# Patient Record
Sex: Male | Born: 1950 | Race: White | Hispanic: No | Marital: Single | Smoking: Never smoker
Health system: Southern US, Community
[De-identification: ages and names within clinical notes are randomized; demographics above are authoritative.]

## PROBLEM LIST (undated history)

## (undated) DIAGNOSIS — I499 Cardiac arrhythmia, unspecified: Secondary | ICD-10-CM

## (undated) DIAGNOSIS — E119 Type 2 diabetes mellitus without complications: Secondary | ICD-10-CM

## (undated) DIAGNOSIS — D649 Anemia, unspecified: Secondary | ICD-10-CM

## (undated) DIAGNOSIS — I4891 Unspecified atrial fibrillation: Secondary | ICD-10-CM

## (undated) DIAGNOSIS — K219 Gastro-esophageal reflux disease without esophagitis: Secondary | ICD-10-CM

## (undated) DIAGNOSIS — G2581 Restless legs syndrome: Secondary | ICD-10-CM

## (undated) DIAGNOSIS — R413 Other amnesia: Secondary | ICD-10-CM

## (undated) DIAGNOSIS — R600 Localized edema: Secondary | ICD-10-CM

## (undated) DIAGNOSIS — C4490 Unspecified malignant neoplasm of skin, unspecified: Secondary | ICD-10-CM

---

## 2007-07-16 ENCOUNTER — Ambulatory Visit: Payer: Self-pay | Admitting: Family Medicine

## 2013-05-27 ENCOUNTER — Ambulatory Visit: Payer: Self-pay | Admitting: Internal Medicine

## 2013-08-27 ENCOUNTER — Ambulatory Visit: Payer: Self-pay | Admitting: Physician Assistant

## 2015-02-05 ENCOUNTER — Encounter: Payer: Self-pay | Admitting: Emergency Medicine

## 2015-02-05 ENCOUNTER — Ambulatory Visit
Admission: EM | Admit: 2015-02-05 | Discharge: 2015-02-05 | Disposition: A | Discharge status: Home / Self Care | Payer: BLUE CROSS/BLUE SHIELD | Attending: Family Medicine | Admitting: Family Medicine

## 2015-02-05 DIAGNOSIS — L03031 Cellulitis of right toe: Secondary | ICD-10-CM

## 2015-02-05 DIAGNOSIS — B353 Tinea pedis: Secondary | ICD-10-CM | POA: Diagnosis not present

## 2015-02-05 MED ORDER — LEVOFLOXACIN 750 MG PO TABS: 750.0000 mg | ORAL_TABLET | Freq: Every day | ORAL | Status: DC

## 2015-02-05 MED ORDER — KETOCONAZOLE 2 % EX CREA: 1.0000 "application " | TOPICAL_CREAM | Freq: Two times a day (BID) | CUTANEOUS | Status: DC

## 2015-02-05 MED ORDER — MUPIROCIN 2 % EX OINT: 1.0000 "application " | TOPICAL_OINTMENT | Freq: Three times a day (TID) | CUTANEOUS | Status: DC

## 2015-02-05 NOTE — ED Notes (Signed)
Patient c/o redness, pain, and swelling in his right 3rd toe since Tuesday.

## 2015-02-05 NOTE — Discharge Instructions (Signed)
Athlete's Foot  Athlete's foot is a skin infection caused by a fungus. Athlete's foot is often seen between or under the toes. It can also be seen on the bottom of the foot. Athlete's foot can spread to other people by sharing towels or shower stalls. HOME CARE  Only take medicines as told by your doctor. Do not use steroid creams.  Wash your feet daily. Dry your feet well, especially between the toes.  Change your socks every day. Wear cotton or wool socks.  Change your socks 2 to 3 times a day in hot weather.  Wear sandals or canvas tennis shoes with good airflow.  If you have blisters, soak your feet in a solution as told by your doctor. Do this for 20 to 30 minutes, 2 times a day. Dry your feet well after you soak them.  Do not share towels.  Wear sandals when you use shared locker rooms or showers. GET HELP RIGHT AWAY IF:   You have a fever.  Your foot is puffy (swollen), sore, warm, or red.  You are not getting better after 7 days of treatment.  You still have athlete's foot after 30 days.  You have problems caused by your medicine. MAKE SURE YOU:   Understand these instructions.  Will watch your condition.  Will get help right away if you are not doing well or get worse.   This information is not intended to replace advice given to you by your health care provider. Make sure you discuss any questions you have with your health care provider.   Document Released: 08/02/2007 Document Revised: 05/08/2011 Document Reviewed: 08/17/2014 Elsevier Interactive Patient Education 2016 [employer]  Cellulitis Cellulitis is an infection of the skin and the tissue under the skin. The infected area is usually red and tender. This happens most often in the arms and lower legs. HOME CARE   Take your antibiotic medicine as told. Finish the medicine even if you start to feel better.  Keep the infected arm or leg raised (elevated).  Put a warm cloth on the area up to 4 times  per day.  Only take medicines as told by your doctor.  Keep all doctor visits as told. GET HELP IF:  You see red streaks on the skin coming from the infected area.  Your red area gets bigger or turns a dark color.  Your bone or joint under the infected area is painful after the skin heals.  Your infection comes back in the same area or different area.  You have a puffy (swollen) bump in the infected area.  You have new symptoms.  You have a fever. GET HELP RIGHT AWAY IF:   You feel very sleepy.  You throw up (vomit) or have watery poop (diarrhea).  You feel sick and have muscle aches and pains.   This information is not intended to replace advice given to you by your health care provider. Make sure you discuss any questions you have with your health care provider.   Document Released: 08/02/2007 Document Revised: 11/04/2014 Document Reviewed: 05/01/2011 Elsevier Interactive Patient Education [employer].

## 2015-02-05 NOTE — ED Provider Notes (Signed)
CSN: [id number]     Arrival date & time 02/05/15  1531 History   First MD Initiated Contact with Patient 02/05/15 1655   Nurses notes were reviewed. Chief Complaint  Patient presents with  . Cellulitis     Patient reports swelling of his right third toe started yesterday. He states he's been wearing tennis shoes that he thinks may have been too tight.for both feet. He also reports a rash on both feet as well.     (Consider location/radiation/quality/duration/timing/severity/associated sxs/prior Treatment) Patient is a 64 y.o. male presenting with abscess. The history is provided by the patient. No language interpreter was used.  Abscess Location:  Foot Foot abscess location:  R foot Abscess quality: induration, itching, painful, redness and warmth   Abscess quality: no fluctuance   Red streaking: yes   Progression:  Worsening Pain details:    Quality:  Throbbing and shooting   Severity:  Moderate   Timing:  Constant   Progression:  Waxing and waning Chronicity:  New Relieved by:  Nothing Ineffective treatments:  Topical antibiotics Associated symptoms: no anorexia, no fatigue, no fever, no headaches, no nausea and no vomiting   Risk factors: no family hx of MRSA, no hx of MRSA and no prior abscess     History reviewed. No pertinent past medical history. History reviewed. No pertinent past surgical history. History reviewed. No pertinent family history. Social History  Substance Use Topics  . Smoking status: Never Smoker   . Smokeless tobacco: None  . Alcohol Use: Yes    Review of Systems  Constitutional: Negative for fever and fatigue.  Gastrointestinal: Negative for nausea, vomiting and anorexia.  Neurological: Negative for headaches.  All other systems reviewed and are negative.   Allergies  Review of patient's allergies indicates no known allergies.  Home Medications   Prior to Admission medications   Medication Sig Start Date End Date Taking? Authorizing  Provider  ketoconazole (NIZORAL) 2 % cream Apply 1 application topically 2 (two) times daily. 02/05/15   Frederich Cha, MD  levofloxacin (LEVAQUIN) 750 MG tablet Take 1 tablet (750 mg total) by mouth daily. 02/05/15   Frederich Cha, MD  mupirocin ointment (BACTROBAN) 2 % Apply 1 application topically 3 (three) times daily. 02/05/15   Frederich Cha, MD   Meds Ordered and Administered this Visit  Medications - No data to display  BP 125/82 mmHg  Pulse 69  Temp(Src) 97.4 F (36.3 C) (Tympanic)  Resp 16  Ht 5\' 7"  (1.702 m)  Wt 170 lb (77.111 kg)  BMI 26.62 kg/m2  SpO2 100% No data found.   Physical Exam  Constitutional: He is oriented to person, place, and time. He appears well-developed and well-nourished.  HENT:  Head: Normocephalic and atraumatic.  Eyes: Pupils are equal, round, and reactive to light.  Neck: Neck supple.  Musculoskeletal:       Right ankle: He exhibits no swelling and no ecchymosis.       Right foot: There is tenderness, swelling and deformity.       Feet:  Patient right third toe is markedly swollen red inflamed this is small ulceration but not deep present there is redness along both bottoms of feet consistent with fungal infection and a cellulitis on the right middle toe.  Neurological: He is alert and oriented to person, place, and time.  Skin: Skin is warm. Rash noted. There is erythema.  Psychiatric: He has a normal mood and affect. His behavior is normal.  Vitals reviewed.  ED Course  Procedures (including critical care time)  Labs Review Labs Reviewed - No data to display  Imaging Review No results found.   Visual Acuity Review  Right Eye Distance:   Left Eye Distance:   Bilateral Distance:    Right Eye Near:   Left Eye Near:    Bilateral Near:         MDM   1. Cellulitis of third toe, right   2. Tinea pedis of both feet     He has been using triple antibiotic ointment and Panafil cream over-the-counter. Stop both of those. Placed on  Bactrim ointment 3 times a day for the infected right middle toe. Nizoral cream for the both feet particularly fungal infection. And then Levaquin 751 tablet day for 10-12 days for the cellulitis of the right middle toe. Recommend podiatry referral next week 4 days. I recommend he stay off his feet this week and he says he has to go to work. Soak feet in Epsom salts twice a day. Elevate feet. And I recommend that he fell with 2 issues that apparently was too tight canal and cause the rubbing of his toes together and the presence of the fungal infection.      Frederich Cha, MD 02/05/15 [phone]

## 2017-02-27 DIAGNOSIS — I639 Cerebral infarction, unspecified: Secondary | ICD-10-CM

## 2017-02-27 HISTORY — DX: Cerebral infarction, unspecified: I63.9

## 2017-05-24 ENCOUNTER — Ambulatory Visit
Admission: EM | Admit: 2017-05-24 | Discharge: 2017-05-24 | Disposition: A | Discharge status: Home / Self Care | Payer: BLUE CROSS/BLUE SHIELD | Attending: Family Medicine | Admitting: Family Medicine

## 2017-05-24 ENCOUNTER — Other Ambulatory Visit: Payer: Self-pay

## 2017-05-24 DIAGNOSIS — M7041 Prepatellar bursitis, right knee: Secondary | ICD-10-CM

## 2017-05-24 HISTORY — DX: Unspecified malignant neoplasm of skin, unspecified: C44.90

## 2017-05-24 MED ORDER — MELOXICAM 15 MG PO TABS: 15.0000 mg | ORAL_TABLET | Freq: Every day | ORAL | 0 refills | Status: DC

## 2017-05-24 NOTE — ED Provider Notes (Signed)
[facility]    CSN: [id number] Arrival date & time: 05/24/17  0850   History   Chief Complaint Chief Complaint  Patient presents with  . Joint Swelling   HPI  67 year old male presents with right knee swelling.  Patient reports a 1-2-week history of swelling of the right knee.  He states that he works on his knees quite a bit.  This is likely the culprit for his swelling.  He reports some associated discomfort but denies pain.  He states that the swelling has persisted.  No recent fall or direct injury that he knows of.  As stated previously, he does work on his knees frequently.  He is no longer using pads for protection.  No reports of redness.  No fever.  No other associated symptoms.  No known relieving factors.  No other complaints at this time.  Social History Social History   Tobacco Use  . Smoking status: Never Smoker  . Smokeless tobacco: Never Used  Substance Use Topics  . Alcohol use: Yes    Comment: rare  . Drug use: Not Currently     Allergies   Patient has no known allergies.   Review of Systems Review of Systems  Constitutional: Negative.   Musculoskeletal:       Right knee swelling.   Physical Exam Triage Vital Signs ED Triage Vitals  Enc Vitals Group     BP 05/24/17 0921 111/81     Pulse Rate 05/24/17 0921 84     Resp 05/24/17 0921 18     Temp 05/24/17 0921 98.2 F (36.8 C)     Temp Source 05/24/17 0921 Oral     SpO2 05/24/17 0921 99 %     Weight 05/24/17 0923 185 lb (83.9 kg)     Height 05/24/17 0923 5\' 10"  (1.778 m)     Head Circumference --      Peak Flow --      Pain Score 05/24/17 0923 0     Pain Loc --      Pain Edu? --      Excl. in [place]? --    Updated Vital Signs BP 111/81 (BP Location: Left Arm)   Pulse 84   Temp 98.2 F (36.8 C) (Oral)   Resp 18   Ht 5\' 10"  (1.778 m)   Wt 185 lb (83.9 kg)   SpO2 99%   BMI 26.54 kg/m    Physical Exam  Constitutional: He is oriented to person, place, and time. He appears  well-developed. No distress.  Pulmonary/Chest: Effort normal. No respiratory distress.  Musculoskeletal:  Left knee -marked effusion noted overlying the patella.  Nontender.  Findings consistent with prepatellar bursitis.  Neurological: He is alert and oriented to person, place, and time.  Skin: Skin is warm. No rash noted. No erythema.  Psychiatric: He has a normal mood and affect. His behavior is normal.  Nursing note and vitals reviewed.  UC Treatments / Results  Labs (all labs ordered are listed, but only abnormal results are displayed) Labs Reviewed - No data to display  EKG None Radiology No results found.  Procedures Join Aspiration/Injection Date/Time: 05/24/2017 11:38 AM Performed by: Coral Spikes, DO Authorized by: Coral Spikes, DO   Consent:    Consent obtained:  Verbal   Consent given by:  Patient Location:    Location:  Knee   Knee:  R knee Anesthesia (see MAR for exact dosages):    Anesthesia method:  Local infiltration  Local anesthetic:  Lidocaine 1% w/o epi Procedure details:    Preparation: Patient was prepped and draped in usual sterile fashion     Needle gauge:  18 G   Ultrasound guidance: no     Approach:  Lateral   Aspirate amount:  50 mL   Aspirate characteristics:  Blood-tinged   Steroid injected: no   Post-procedure details:    Patient tolerance of procedure:  Tolerated well, no immediate complications   (including critical care time)   Medications Ordered in UC Medications - No data to display   Initial Impression / Assessment and Plan / UC Course  I have reviewed the triage vital signs and the nursing notes.  Pertinent labs & imaging results that were available during my care of the patient were reviewed by me and considered in my medical decision making (see chart for details).     67 year old male presents with prepatellar bursitis.  Aspiration done today with removal of 50 mL's of blood-tinged fluid.  Placed on meloxicam.   Advised a knee sleeve and to try and avoid working on his knees.  Final Clinical Impressions(s) / UC Diagnoses   Final diagnoses:  Prepatellar bursitis of right knee    ED Discharge Orders        Ordered    meloxicam (MOBIC) 15 MG tablet  Daily     05/24/17 1110     Controlled Substance Prescriptions [place] Controlled Substance Registry consulted? Not Applicable   Coral Spikes, DO 05/24/17 1140

## 2017-05-24 NOTE — Discharge Instructions (Signed)
Rest.  Knee sleeve daily.  Medication as prescribed.  Take care  Dr. Lacinda Axon

## 2017-05-24 NOTE — ED Triage Notes (Addendum)
Pt with right patellar swelling/fluid x one week. Denies pain.

## 2019-01-21 ENCOUNTER — Other Ambulatory Visit: Payer: Self-pay

## 2019-01-21 ENCOUNTER — Emergency Department: Payer: Medicare Other

## 2019-01-21 ENCOUNTER — Encounter: Payer: Self-pay | Admitting: Emergency Medicine

## 2019-01-21 ENCOUNTER — Observation Stay
Admission: EM | Admit: 2019-01-21 | Discharge: 2019-01-22 | Disposition: A | Discharge status: Home / Self Care | Payer: Medicare Other | Attending: Internal Medicine | Admitting: Internal Medicine

## 2019-01-21 DIAGNOSIS — I82409 Acute embolism and thrombosis of unspecified deep veins of unspecified lower extremity: Secondary | ICD-10-CM | POA: Diagnosis not present

## 2019-01-21 DIAGNOSIS — R7989 Other specified abnormal findings of blood chemistry: Secondary | ICD-10-CM | POA: Insufficient documentation

## 2019-01-21 DIAGNOSIS — Z7901 Long term (current) use of anticoagulants: Secondary | ICD-10-CM | POA: Insufficient documentation

## 2019-01-21 DIAGNOSIS — I472 Ventricular tachycardia: Secondary | ICD-10-CM | POA: Diagnosis not present

## 2019-01-21 DIAGNOSIS — R55 Syncope and collapse: Secondary | ICD-10-CM | POA: Diagnosis present

## 2019-01-21 DIAGNOSIS — I480 Paroxysmal atrial fibrillation: Secondary | ICD-10-CM | POA: Insufficient documentation

## 2019-01-21 DIAGNOSIS — E876 Hypokalemia: Secondary | ICD-10-CM | POA: Diagnosis not present

## 2019-01-21 DIAGNOSIS — D649 Anemia, unspecified: Secondary | ICD-10-CM | POA: Diagnosis not present

## 2019-01-21 DIAGNOSIS — R42 Dizziness and giddiness: Secondary | ICD-10-CM

## 2019-01-21 DIAGNOSIS — R0789 Other chest pain: Secondary | ICD-10-CM | POA: Diagnosis not present

## 2019-01-21 DIAGNOSIS — Z8673 Personal history of transient ischemic attack (TIA), and cerebral infarction without residual deficits: Secondary | ICD-10-CM | POA: Diagnosis not present

## 2019-01-21 DIAGNOSIS — Z85828 Personal history of other malignant neoplasm of skin: Secondary | ICD-10-CM | POA: Diagnosis not present

## 2019-01-21 DIAGNOSIS — I959 Hypotension, unspecified: Secondary | ICD-10-CM | POA: Diagnosis present

## 2019-01-21 DIAGNOSIS — Z791 Long term (current) use of non-steroidal anti-inflammatories (NSAID): Secondary | ICD-10-CM | POA: Insufficient documentation

## 2019-01-21 DIAGNOSIS — R000 Tachycardia, unspecified: Secondary | ICD-10-CM

## 2019-01-21 DIAGNOSIS — Z20828 Contact with and (suspected) exposure to other viral communicable diseases: Secondary | ICD-10-CM | POA: Insufficient documentation

## 2019-01-21 DIAGNOSIS — I2510 Atherosclerotic heart disease of native coronary artery without angina pectoris: Secondary | ICD-10-CM | POA: Insufficient documentation

## 2019-01-21 LAB — CBC WITH DIFFERENTIAL/PLATELET
Abs Immature Granulocytes: 0.04 10*3/uL (ref 0.00–0.07)
Basophils Absolute: 0.1 10*3/uL (ref 0.0–0.1)
Basophils Relative: 1 %
Eosinophils Absolute: 0.1 10*3/uL (ref 0.0–0.5)
Eosinophils Relative: 1 %
HCT: 37.1 % — ABNORMAL LOW (ref 39.0–52.0)
Hemoglobin: 12.6 g/dL — ABNORMAL LOW (ref 13.0–17.0)
Immature Granulocytes: 0 %
Lymphocytes Relative: 32 %
Lymphs Abs: 3.3 10*3/uL (ref 0.7–4.0)
MCH: 30.7 pg (ref 26.0–34.0)
MCHC: 34 g/dL (ref 30.0–36.0)
MCV: 90.3 fL (ref 80.0–100.0)
Monocytes Absolute: 0.6 10*3/uL (ref 0.1–1.0)
Monocytes Relative: 6 %
Neutro Abs: 6.1 10*3/uL (ref 1.7–7.7)
Neutrophils Relative %: 60 %
Platelets: 276 10*3/uL (ref 150–400)
RBC: 4.11 MIL/uL — ABNORMAL LOW (ref 4.22–5.81)
RDW: 13.4 % (ref 11.5–15.5)
WBC: 10.3 10*3/uL (ref 4.0–10.5)
nRBC: 0 % (ref 0.0–0.2)

## 2019-01-21 LAB — PROTIME-INR
INR: 1.1 (ref 0.8–1.2)
Prothrombin Time: 13.7 seconds (ref 11.4–15.2)

## 2019-01-21 LAB — COMPREHENSIVE METABOLIC PANEL
ALT: 26 U/L (ref 0–44)
AST: 27 U/L (ref 15–41)
Albumin: 3.9 g/dL (ref 3.5–5.0)
Alkaline Phosphatase: 49 U/L (ref 38–126)
Anion gap: 14 (ref 5–15)
BUN: 23 mg/dL (ref 8–23)
CO2: 22 mmol/L (ref 22–32)
Calcium: 8.8 mg/dL — ABNORMAL LOW (ref 8.9–10.3)
Chloride: 101 mmol/L (ref 98–111)
Creatinine, Ser: 0.78 mg/dL (ref 0.61–1.24)
GFR calc Af Amer: >60 mL/min (ref >60)
GFR calc non Af Amer: >60 mL/min (ref >60)
Glucose, Bld: 194 mg/dL — ABNORMAL HIGH (ref 70–99)
Potassium: 3.4 mmol/L — ABNORMAL LOW (ref 3.5–5.1)
Sodium: 137 mmol/L (ref 135–145)
Total Bilirubin: 0.7 mg/dL (ref 0.3–1.2)
Total Protein: 6.7 g/dL (ref 6.5–8.1)

## 2019-01-21 LAB — TROPONIN I (HIGH SENSITIVITY)
Troponin I (High Sensitivity): 21 ng/L — ABNORMAL HIGH (ref <18)
Troponin I (High Sensitivity): 389 ng/L (ref <18)

## 2019-01-21 LAB — APTT: aPTT: 28 seconds (ref 24–36)

## 2019-01-21 LAB — MAGNESIUM: Magnesium: 2 mg/dL (ref 1.7–2.4)

## 2019-01-21 LAB — T4, FREE: Free T4: 0.87 ng/dL (ref 0.61–1.12)

## 2019-01-21 LAB — TSH: TSH: 4.729 u[IU]/mL — ABNORMAL HIGH (ref 0.350–4.500)

## 2019-01-21 MED ORDER — AMIODARONE HCL IN DEXTROSE 360-4.14 MG/200ML-% IV SOLN
30.0000 mg/h | INTRAVENOUS | Status: DC
  Administered 2019-01-22: 02:00:00 30 mg/h via INTRAVENOUS
  Filled 2019-01-21: qty 200

## 2019-01-21 MED ORDER — POTASSIUM CHLORIDE CRYS ER 20 MEQ PO TBCR: 40.0000 meq | EXTENDED_RELEASE_TABLET | Freq: Once | ORAL | Status: DC

## 2019-01-21 MED ORDER — DILTIAZEM HCL 30 MG PO TABS
60.0000 mg | ORAL_TABLET | Freq: Four times a day (QID) | ORAL | Status: DC
  Administered 2019-01-21 – 2019-01-22 (×3): 60 mg via ORAL
  Filled 2019-01-21 (×3): qty 2

## 2019-01-21 MED ORDER — ETOMIDATE 2 MG/ML IV SOLN
INTRAVENOUS | Status: AC
  Filled 2019-01-21: qty 10

## 2019-01-21 MED ORDER — POTASSIUM CHLORIDE CRYS ER 20 MEQ PO TBCR
40.0000 meq | EXTENDED_RELEASE_TABLET | Freq: Once | ORAL | Status: AC
  Administered 2019-01-21: 40 meq via ORAL
  Filled 2019-01-21: qty 2

## 2019-01-21 MED ORDER — AMIODARONE HCL IN DEXTROSE 360-4.14 MG/200ML-% IV SOLN
60.0000 mg/h | INTRAVENOUS | Status: AC
  Administered 2019-01-21: 60 mg/h via INTRAVENOUS
  Filled 2019-01-21: qty 200

## 2019-01-21 MED ORDER — MAGNESIUM OXIDE 400 (241.3 MG) MG PO TABS
400.0000 mg | ORAL_TABLET | Freq: Every day | ORAL | Status: DC
  Administered 2019-01-22: 400 mg via ORAL
  Filled 2019-01-21: qty 1

## 2019-01-21 MED ORDER — POTASSIUM CHLORIDE CRYS ER 20 MEQ PO TBCR
20.0000 meq | EXTENDED_RELEASE_TABLET | Freq: Every day | ORAL | Status: DC
Start: 2019-01-21 — End: 2019-01-21
  Administered 2019-01-21: 20 meq via ORAL
  Filled 2019-01-21: qty 1

## 2019-01-21 MED ORDER — APIXABAN 5 MG PO TABS
5.0000 mg | ORAL_TABLET | Freq: Two times a day (BID) | ORAL | Status: DC
  Administered 2019-01-21 – 2019-01-22 (×2): 5 mg via ORAL
  Filled 2019-01-21 (×2): qty 1

## 2019-01-21 MED ORDER — ATORVASTATIN CALCIUM 80 MG PO TABS
80.0000 mg | ORAL_TABLET | Freq: Every day | ORAL | Status: DC
  Administered 2019-01-22: 80 mg via ORAL
  Filled 2019-01-21: qty 1

## 2019-01-21 MED ORDER — ETOMIDATE 2 MG/ML IV SOLN
8.0000 mg | Freq: Once | INTRAVENOUS | Status: AC
  Administered 2019-01-21: 8 mg via INTRAVENOUS

## 2019-01-21 MED ORDER — DILTIAZEM HCL ER COATED BEADS 120 MG PO CP24: 120.0000 mg | ORAL_CAPSULE | Freq: Every day | ORAL | Status: DC

## 2019-01-21 NOTE — Progress Notes (Signed)
Telemetry called and stated patient has a 83 beat run of V-tach. Dr paged, will continue to monitor.

## 2019-01-21 NOTE — ED Notes (Signed)
9 beat run of vtach noted on monitor; pt remains without c/o; daughter at bedside; both voice good understanding of plan of care

## 2019-01-21 NOTE — ED Notes (Signed)
5 beat run vtach

## 2019-01-21 NOTE — ED Notes (Signed)
Pt assisted to stand at bedside to void; denies c/o; pt assisted into hosp gown; remains on card monitor, SR with PVC's noted

## 2019-01-21 NOTE — ED Triage Notes (Addendum)
Pt arrived via [facility] from working at [employer]. States that he felt very faint and passed out while working and lifting an object over his headaround 1730. States that this has never occurred before. Pt denies SOB, fever.

## 2019-01-21 NOTE — ED Provider Notes (Signed)
[facility] ([facility]) Emergency Department Provider Note  ____________________________________________   First MD Initiated Contact with Patient 01/21/19 1851     (approximate)  I have reviewed the triage vital signs and the nursing notes.   HISTORY  Chief Complaint Loss of Consciousness    HPI Timothy Wells is a 68 y.o. male with paroxysmal A. fib on Eliquis who presents with dizziness.  Patient was at work when he lifted his arms up and had sudden onset of severe dizziness that was constant, nothing made it better, nothing made it worse.  On EMS arrival patient was found to be in a wide-complex tachycardia rate of 225.  Patient was initially little hypotensive but was given 150 of amnio.  His blood pressures initially improved that started getting hypotensive again.  Upon arrival patient is alert and oriented.  He does have some chest pressure sensation.  He denies ever need electricity before.  He denies missing any of his Eliquis dosing.          Past Medical History:  Diagnosis Date   Skin cancer     There are no active problems to display for this patient.   No past surgical history on file.  Prior to Admission medications   Medication Sig Start Date End Date Taking? Authorizing Provider  meloxicam (MOBIC) 15 MG tablet Take 1 tablet (15 mg total) by mouth daily. 05/24/17   Coral Spikes, DO    Allergies Patient has no known allergies.  No family history on file.  Social History Social History   Tobacco Use   Smoking status: Never Smoker   Smokeless tobacco: Never Used  Substance Use Topics   Alcohol use: Yes    Comment: rare   Drug use: Not Currently      Review of Systems Constitutional: No fever/chills Eyes: No visual changes. ENT: No sore throat. Cardiovascular: Positive chest pain Respiratory: Denies shortness of breath. Gastrointestinal: No abdominal pain.  No nausea, no vomiting.  No diarrhea.  No  constipation. Genitourinary: Negative for dysuria. Musculoskeletal: Negative for back pain. Skin: Negative for rash. Neurological: Negative for headaches, focal weakness or numbness.  Positive lightheadedness All other ROS negative ____________________________________________   PHYSICAL EXAM:  VITAL SIGNS: ED Triage Vitals  Enc Vitals Group     BP 01/21/19 1843 91/69     Pulse Rate 01/21/19 1843 (!) 119     Resp 01/21/19 1843 (!) 30     Temp --      Temp src --      SpO2 01/21/19 1843 95 %     Weight 01/21/19 1840 187 lb (84.8 kg)     Height --      Head Circumference --      Peak Flow --      Pain Score 01/21/19 1845 0     Pain Loc --      Pain Edu? --      Excl. in [place]? --     Constitutional: Alert and oriented. Well appearing and in no acute distress. Eyes: Conjunctivae are normal. EOMI. Head: Atraumatic. Nose: No congestion/rhinnorhea. Mouth/Throat: Mucous membranes are moist.   Neck: No stridor. Trachea Midline. FROM Cardiovascular: Fast, regular heart rate Respiratory: Normal respiratory effort.  No retractions. Lungs CTAB. Gastrointestinal: Soft and nontender. No distention. No abdominal bruits.  Musculoskeletal: No lower extremity tenderness nor edema.  No joint effusions. Neurologic:  Normal speech and language. No gross focal neurologic deficits are appreciated.  Skin:  Skin is  warm, dry and intact. No rash noted. Psychiatric: Mood and affect are normal. Speech and behavior are normal. GU: Deferred   ____________________________________________   LABS (all labs ordered are listed, but only abnormal results are displayed)  Labs Reviewed  CBC WITH DIFFERENTIAL/PLATELET - Abnormal; Notable for the following components:      Result Value   RBC 4.11 (*)    Hemoglobin 12.6 (*)    HCT 37.1 (*)    All other components within normal limits  COMPREHENSIVE METABOLIC PANEL - Abnormal; Notable for the following components:   Potassium 3.4 (*)    Glucose, Bld 194  (*)    Calcium 8.8 (*)    All other components within normal limits  TSH - Abnormal; Notable for the following components:   TSH 4.729 (*)    All other components within normal limits  TROPONIN I (HIGH SENSITIVITY) - Abnormal; Notable for the following components:   Troponin I (High Sensitivity) 21 (*)    All other components within normal limits  SARS CORONAVIRUS 2 (TAT 6-24 HRS)  APTT  PROTIME-INR  MAGNESIUM  T4, FREE  TROPONIN I (HIGH SENSITIVITY)   ____________________________________________   ED ECG REPORT I, Vanessa Chatham, the attending physician, personally viewed and interpreted this ECG.  Initial EKG is a wide-complex tachycardia rate of 212, no ST elevation, no T wave inversions, consider possible V. tach versus A. fib with aberrancy ____________________________________________  RADIOLOGY Robert Bellow, personally viewed and evaluated these images (plain radiographs) as part of my medical decision making, as well as reviewing the written report by the radiologist.  ED MD interpretation:  No PNA   Official radiology report(s): Dg Chest Portable 1 View  Result Date: 01/21/2019 CLINICAL DATA:  Chest pain EXAM: PORTABLE CHEST 1 VIEW COMPARISON:  None. FINDINGS: Mild cardiac enlargement. Negative for heart failure or edema. Lungs are well aerated without infiltrate or effusion. IMPRESSION: No active disease. Electronically Signed   By: Franchot Gallo M.D.   On: 01/21/2019 19:24    ____________________________________________   PROCEDURES  Procedure(s) performed (including Critical Care):  .Sedation  Date/Time: 01/21/2019 6:55 PM Performed by: Vanessa Carmen, MD Authorized by: Vanessa [place], MD   Consent:    Consent obtained:  Verbal   Consent given by:  Patient   Risks discussed:  Allergic reaction, dysrhythmia, inadequate sedation, nausea, prolonged hypoxia resulting in organ damage, prolonged sedation necessitating reversal, respiratory compromise  necessitating ventilatory assistance and intubation and vomiting   Alternatives discussed:  Analgesia without sedation, anxiolysis and regional anesthesia Universal protocol:    Procedure explained and questions answered to patient or proxy's satisfaction: yes     Relevant documents present and verified: yes     Test results available and properly labeled: yes     Imaging studies available: yes     Required blood products, implants, devices, and special equipment available: yes     Site/side marked: yes     Immediately prior to procedure a time out was called: yes     Patient identity confirmation method:  Verbally with patient Indications:    Procedure necessitating sedation performed by:  Physician performing sedation Pre-sedation assessment:    Time since last food or drink:  7 hours    ASA classification: class 2 - patient with mild systemic disease     Neck mobility: normal     Mouth opening:  3 or more finger widths   Thyromental distance:  4 finger widths   Mallampati score:  I - soft palate, uvula, fauces, pillars visible   Pre-sedation assessments completed and reviewed: airway patency, cardiovascular function, hydration status, mental status, nausea/vomiting, pain level, respiratory function and temperature   Immediate pre-procedure details:    Reassessment: Patient reassessed immediately prior to procedure     Reviewed: vital signs, relevant labs/tests and NPO status     Verified: bag valve mask available, emergency equipment available, intubation equipment available, IV patency confirmed, oxygen available and suction available   Procedure details (see MAR for exact dosages):    Preoxygenation:  Nasal cannula   Sedation:  Etomidate   Intended level of sedation: deep   Intra-procedure monitoring:  Blood pressure monitoring, cardiac monitor, continuous pulse oximetry, frequent LOC assessments, frequent vital sign checks and continuous capnometry   Intra-procedure events: none      Total Provider sedation time (minutes):  10 Post-procedure details:    Attendance: Constant attendance by certified staff until patient recovered     Recovery: Patient returned to pre-procedure baseline     Post-sedation assessments completed and reviewed: airway patency, cardiovascular function, hydration status, mental status, nausea/vomiting, pain level, respiratory function and temperature     Patient is stable for discharge or admission: yes     Patient tolerance:  Tolerated well, no immediate complications .Cardioversion  Date/Time: 01/21/2019 6:55 PM Performed by: Vanessa Tensed, MD Authorized by: Vanessa Guin[place], MD   Consent:    Consent obtained:  Verbal   Consent given by:  Patient   Risks discussed:  Cutaneous burn, death, induced arrhythmia and pain   Alternatives discussed:  No treatment and rate-control medication Pre-procedure details:    Cardioversion basis:  Emergent   Rhythm:  Ventricular tachycardia   Electrode placement:  Anterior-posterior Attempt one:    Cardioversion mode:  Synchronous   Waveform:  Biphasic   Shock (Joules):  200   Shock outcome:  Conversion to normal sinus rhythm Post-procedure details:    Patient status:  Awake   Patient tolerance of procedure:  Tolerated well, no immediate complications .Critical Care Performed by: Vanessa Waggoner, MD Authorized by: Vanessa Haena, MD   Critical care provider statement:    Critical care time (minutes):  31   Critical care was necessary to treat or prevent imminent or life-threatening deterioration of the following conditions:  Circulatory failure   Critical care was time spent personally by me on the following activities:  Discussions with consultants, evaluation of patient's response to treatment, examination of patient, ordering and performing treatments and interventions, ordering and review of laboratory studies, ordering and review of radiographic studies, pulse oximetry, re-evaluation of patient's  condition, obtaining history from patient or surrogate and review of old charts     ____________________________________________   INITIAL IMPRESSION / ASSESSMENT AND PLAN / ED COURSE   Timothy Wells was evaluated in Emergency Department on 01/21/2019 for the symptoms described in the history of present illness. He was evaluated in the context of the global COVID-19 pandemic, which necessitated consideration that the patient might be at risk for infection with the SARS-CoV-2 virus that causes COVID-19. Institutional protocols and algorithms that pertain to the evaluation of patients at risk for COVID-19 are in a state of rapid change based on information released by regulatory bodies including the CDC and federal and state organizations. These policies and algorithms were followed during the patient's care in the ED.    Patient heart rhythm is either V. tach versus A. fib with aberrancy.  Appears pretty regular.  He is slightly hypotensive and he already failed a trial of amiodarone.  I discussed with patient either cardioversion with sedation versus trialing some metoprolol.  However given his blood pressures in the transit this could be V. tach I think that the safest thing would be to cardiovert him especially since he is on Eliquis as well as 3 doses.  Patient understands the risk including stroke.  We will proceed with cardioversion and sedation.   DDx that was also considered d/t potential to cause harm, but was found less likely based on history and physical (as detailed above): -PNA (no fevers, cough but CXR to evaluate) -PNX (reassured with equal b/l breath sounds, CXR to evaluate) -Symptomatic anemia (will get H&H) -Pulmonary embolism as no sob at rest, not pleuritic in nature, no hypoxia -Aortic Dissection as no tearing pain and no radiation to the mid back, pulses equal -Pericarditis no rub on exam, EKG changes or hx to suggest dx -Tamponade (no notable SOB, tachycardic,  hypotensive) -Esophageal rupture (no h/o diffuse vomitting/no crepitus)  Patient was sedated with 8 mg of etomidate and cardioverted with 200 mg.  Patient tolerated well and converted back into normal sinus.  Repeat EKG is a sinus tachycardia rate of 122, T wave inversions in V2, right bundle branch block, areas of depression and maybe a little bit of elevation in aVR and V1, otherwise normal intervals  Discussed with Dr. Ubaldo Glassing from cardiology.  Will start on amnio drip.  Patient does have a couple episodes of nonsustained V. Tach.  Patient otherwise has remained well-appearing.    Repeat EKG is sinus tachycardia rate of 102 with a incomplete right bundle branch block some ST depression but no ST elevation, T wave inversion in V1 and V2.  D.w the hospital team for admission.      ____________________________________________   FINAL CLINICAL IMPRESSION(S) / ED DIAGNOSES   Final diagnoses:  Wide-complex tachycardia ([place])  Syncope, unspecified syncope type     MEDICATIONS GIVEN DURING THIS VISIT:  Medications  amiodarone (NEXTERONE PREMIX) 360-4.14 MG/200ML-% (1.8 mg/mL) IV infusion (has no administration in time range)  amiodarone (NEXTERONE PREMIX) 360-4.14 MG/200ML-% (1.8 mg/mL) IV infusion (has no administration in time range)  potassium chloride SA (KLOR-CON) CR tablet 20 mEq (has no administration in time range)  etomidate (AMIDATE) injection 8 mg (8 mg Intravenous Given 01/21/19 1847)     ED Discharge Orders    None       Note:  This document was prepared using Dragon voice recognition software and may include unintentional dictation errors.   Vanessa [place], MD 01/21/19 2022

## 2019-01-21 NOTE — ED Notes (Signed)
Pt with synchronized cardioversion at 200 J by Dr. Jari Pigg.

## 2019-01-21 NOTE — H&P (Signed)
History and Physical    Timothy Wells [id number] DOB: Nov 07, 1950 DOA: 01/21/2019  PCP: Patient, No Pcp Per  Patient coming from: work  I have personally briefly reviewed patient's old medical records in [place]  Chief Complaint: dizziness and generalized weakness  HPI: Timothy Wells is a 68 y.o. male with medical history significant of paroxysmal atrial fibrillation on Eliquis, CVA with residual mild memory loss, skin basal cell carcinoma who presents with concerns of lightheadedness and dizziness.  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  On EMS arrival, patient was reportedly found to be in wide-complex tachycardia with rates of 225.  He initially had soft blood pressure but was given 150 mg of amiodarone with some improvement in his blood pressure.  Patient then became hypotensive again and was successfully cardioverted in the ED with return of sinus rhythm.  Repeat EKG shows sinus tachycardia with rate of 102 with incomplete right bundle branch block and some ST depression but no elevation. ED physician discussed case with Dr. Kyra Searles from cardiology and was recommended to start patient on amiodarone drip.  CBC shows no leukocytosis and had mild anemia of 12.6.  CMP shows potassium of 3.4, glucose of 194 and normal creatinine of 0.78.  Magnesium of 2.  Troponin of 21.  TSH of 4.729 and free T4 of 0.87.  Chest x-ray shows mild cardiac enlargement but no active disease.  Patient denies any fevers, chills or feeling unwell prior to this episode.  He has been eating and drinking fine.   He denies any tobacco, alcohol or illicit drug use. No family history of cardiac arrhythmias.  Review of Systems:  Constitutional: No Weight Change, No Fever ENT/Mouth: No sore throat, No Rhinorrhea Eyes: No Eye Pain, No Vision Changes Cardiovascular: + Chest  Pain, no SOB Respiratory: No Cough, No Sputum, No Wheezing, no Dyspnea  Gastrointestinal: No Nausea, No Vomiting, No Diarrhea, No Constipation, No Pain Genitourinary: no Urinary Incontinence, No Urgency, No Flank Pain Musculoskeletal: No Arthralgias, No Myalgias Skin: No Skin Lesions, No Pruritus, Neuro: + Weakness, No Numbness,  No Loss of Consciousness, No Syncope Psych: No Anxiety/Panic, No Depression, no decrease appetite Heme/Lymph: No Bruising, No Bleeding  Past Medical History:  Diagnosis Date  . Skin cancer     History reviewed. No pertinent surgical history.   reports that he has never smoked. He has never used smokeless tobacco. He reports current alcohol use. He reports previous drug use.  No Known Allergies  History reviewed. No pertinent family history.   Prior to Admission medications   Medication Sig Start Date End Date Taking? Authorizing Provider  meloxicam (MOBIC) 15 MG tablet Take 1 tablet (15 mg total) by mouth daily. 05/24/17   Coral Spikes, DO    Physical Exam: Vitals:   01/21/19 1845 01/21/19 1848 01/21/19 1859 01/21/19 1905  BP: 91/69 135/66 105/68   Pulse: (!) 217 (!) 217 80   Resp: 19 20 15    SpO2: 95% 95% 98%   Weight:      Height:    5\' 7"  (1.702 m)    Constitutional: NAD, calm, comfortable, well appearing male laying in bed on 20 degree incline. Vitals:   01/21/19 1845 01/21/19 1848 01/21/19 1859 01/21/19 1905  BP: 91/69 135/66 105/68   Pulse: (!) 217 (!) 217 80   Resp: 19 20 15    SpO2: 95%  95% 98%   Weight:      Height:    5\' 7"  (1.702 m)   Eyes: PERRL, lids and conjunctivae normal ENMT: Mucous membranes are moist. Posterior pharynx clear of any exudate or lesions.Normal dentition.  Neck: normal, supple, no masses Respiratory: clear to auscultation bilaterally, no wheezing, no crackles. Normal respiratory effort. No accessory muscle use.  Cardiovascular: Regular rate up to HR 90 and frequent PVCs and occasionally PACs on telemetry  monitor, no murmurs / rubs / gallops. No extremity edema. 2+ pedal pulses. Abdomen: no tenderness, no masses palpated.  Bowel sounds positive.  Musculoskeletal: no clubbing / cyanosis. No joint deformity upper and lower extremities. Good ROM, no contractures. Normal muscle tone.  Skin: no rashes, lesions, ulcers. No induration Neurologic: CN 2-12 grossly intact. Sensation intact. Strength 5/5 in all 4.  Psychiatric: Normal judgment and insight. Alert and oriented x 3. Normal mood.     Labs on Admission: I have personally reviewed following labs and imaging studies  CBC: Recent Labs  Lab 01/21/19 1839  WBC 10.3  NEUTROABS 6.1  HGB 12.6*  HCT 37.1*  MCV 90.3  PLT [id number]   Basic Metabolic Panel: Recent Labs  Lab 01/21/19 1839 01/21/19 1852  NA 137  --   K 3.4*  --   CL 101  --   CO2 22  --   GLUCOSE 194*  --   BUN 23  --   CREATININE 0.78  --   CALCIUM 8.8*  --   MG  --  2.0   GFR: Estimated Creatinine Clearance: 94.6 mL/min (by C-G formula based on SCr of 0.78 mg/dL). Liver Function Tests: Recent Labs  Lab 01/21/19 1839  AST 27  ALT 26  ALKPHOS 49  BILITOT 0.7  PROT 6.7  ALBUMIN 3.9   No results for input(s): LIPASE, AMYLASE in the last 168 hours. No results for input(s): AMMONIA in the last 168 hours. Coagulation Profile: Recent Labs  Lab 01/21/19 1839  INR 1.1   Cardiac Enzymes: No results for input(s): CKTOTAL, CKMB, CKMBINDEX, TROPONINI in the last 168 hours. BNP (last 3 results) No results for input(s): PROBNP in the last 8760 hours. HbA1C: No results for input(s): HGBA1C in the last 72 hours. CBG: No results for input(s): GLUCAP in the last 168 hours. Lipid Profile: No results for input(s): CHOL, HDL, LDLCALC, TRIG, CHOLHDL, LDLDIRECT in the last 72 hours. Thyroid Function Tests: Recent Labs    01/21/19 1852  TSH 4.729*  FREET4 0.87   Anemia Panel: No results for input(s): VITAMINB12, FOLATE, FERRITIN, TIBC, IRON, RETICCTPCT in the last 72  hours. Urine analysis: No results found for: COLORURINE, APPEARANCEUR, LABSPEC, [place], GLUCOSEU, HGBUR, BILIRUBINUR, KETONESUR, PROTEINUR, UROBILINOGEN, NITRITE, LEUKOCYTESUR  Radiological Exams on Admission: Dg Chest Portable 1 View  Result Date: 01/21/2019 CLINICAL DATA:  Chest pain EXAM: PORTABLE CHEST 1 VIEW COMPARISON:  None. FINDINGS: Mild cardiac enlargement. Negative for heart failure or edema. Lungs are well aerated without infiltrate or effusion. IMPRESSION: No active disease. Electronically Signed   By: Franchot Gallo M.D.   On: 01/21/2019 19:24    EKG: Independently reviewed.   Assessment/Plan  Hypotension in the setting of Wide- complex tachycardia with hx of paroxysmal atrial fibrillation - s/p successful cardioversion in the ED back to normal sinus rhythm - cardiology consulted and recommend amiodarone gtt - admit to telemetry for continuous monitoring - K of 3.4 - will replete to 4. Mg at 2. - TSH elevated at 4.729 with normal Free T4  consistent with subclinical hypothyroidism- TSH level does not warrant treatment at this time - obtain Echocardiogram - continue Eliquis - continue diltiazem  Hx of CVA - continue statin   DVT prophylaxis:.Lovenox Code Status: Full Family Communication: Plan discussed with patient at bedside  disposition Plan: Home with observation Consults called: Cardiology Admission status: Observation  Marcile Fuquay T Sheletha Bow DO [facility]   If 7PM-7AM, please contact night-coverage [email or web address] Password [email or web address]  01/21/2019, 9:00 PM

## 2019-01-21 NOTE — ED Notes (Addendum)
3 beat run [employer]

## 2019-01-21 NOTE — Progress Notes (Signed)
Notified by telemetry of patient having an 83 beat run of V. Tach.  Patient was standing up on a scale at the time.  He was asymptomatic.  Blood pressure maintained normal with systolic of [id number]. Discussed findings with cardiology Dr. Ubaldo Glassing, who will put in further treatment plan.

## 2019-01-21 NOTE — ED Notes (Signed)
4 beat run vtach

## 2019-01-21 NOTE — ED Notes (Signed)
hospitalist at bedside to see pt regarding admission

## 2019-01-21 NOTE — ED Notes (Signed)
Lattie Haw RN aware of bed assigned

## 2019-01-21 NOTE — ED Notes (Signed)
Pt uprite on stretcher in exam room with no distress noted; pt denies any c/o at present

## 2019-01-21 NOTE — ED Notes (Addendum)
Pt noted to pause while speaking, card monitor indicates 9 beat run Tanna Furry; MD notified; pt with no c/o

## 2019-01-21 NOTE — ED Notes (Signed)
ED TO INPATIENT HANDOFF REPORT  ED Nurse Name and Phone #: Lattie Haw [phone] Name/Age/Gender Timothy Wells 68 y.o. male Room/Bed: ED03A/ED03A  Code Status   Code Status: Full Code  Home/SNF/Other Home Patient oriented to: self, place, time and situation Is this baseline? Yes   Triage Complete: Triage complete  Chief Complaint  Rapid Heart Rate  Triage Note Pt arrived via [facility] from working at [employer]. States that he felt very faint and passed out while working and lifting an object over his headaround 1730. States that this has never occurred before. Pt denies SOB, fever.   Allergies No Known Allergies  Level of Care/Admitting Diagnosis ED Disposition    ED Disposition Condition [facility] Area: [place] [[id number]]  Level of Care: Telemetry [5]  Covid Evaluation: Asymptomatic Screening Protocol (No Symptoms)  Diagnosis: Wide-complex tachycardia [facility]) [id number]  Admitting Physician: Orene Desanctis [id number]  Attending Physician: Orene Desanctis [id number]  PT Class (Do Not Modify): Observation [104]  PT Acc Code (Do Not Modify): Observation [10022]       B Medical/Surgery History Past Medical History:  Diagnosis Date  . Skin cancer    History reviewed. No pertinent surgical history.   A IV Location/Drains/Wounds Patient Lines/Drains/Airways Status   Active Line/Drains/Airways    Name:   Placement date:   Placement time:   Site:   Days:   Peripheral IV 01/21/19 Left Wrist   01/21/19    -    Wrist   less than 1   Peripheral IV 01/21/19 Right Antecubital   01/21/19    1900    Antecubital   less than 1          Intake/Output Last 24 hours No intake or output data in the 24 hours ending 01/21/19 2109  Labs/Imaging Results for orders placed or performed during the hospital encounter of 01/21/19 (from the past 48 hour(s))  CBC with Differential     Status: Abnormal   Collection Time: 01/21/19  6:39 PM  Result Value Ref Range   WBC 10.3 4.0 - 10.5 K/uL   RBC 4.11 (L) 4.22 - 5.81 MIL/uL   Hemoglobin 12.6 (L) 13.0 - 17.0 g/dL   HCT 37.1 (L) 39.0 - 52.0 %   MCV 90.3 80.0 - 100.0 fL   MCH 30.7 26.0 - 34.0 pg   MCHC 34.0 30.0 - 36.0 g/dL   RDW 13.4 11.5 - 15.5 %   Platelets 276 150 - 400 K/uL   nRBC 0.0 0.0 - 0.2 %   Neutrophils Relative % 60 %   Neutro Abs 6.1 1.7 - 7.7 K/uL   Lymphocytes Relative 32 %   Lymphs Abs 3.3 0.7 - 4.0 K/uL   Monocytes Relative 6 %   Monocytes Absolute 0.6 0.1 - 1.0 K/uL   Eosinophils Relative 1 %   Eosinophils Absolute 0.1 0.0 - 0.5 K/uL   Basophils Relative 1 %   Basophils Absolute 0.1 0.0 - 0.1 K/uL   Immature Granulocytes 0 %   Abs Immature Granulocytes 0.04 0.00 - 0.07 K/uL    Comment: Performed at [facility], [address] [place], [place] [address]  Troponin I (High Sensitivity)     Status: Abnormal   Collection Time: 01/21/19  6:39 PM  Result Value Ref Range   Troponin I (High Sensitivity) 21 (H) <18 ng/L    Comment: (NOTE) Elevated high sensitivity troponin I (hsTnI) values and significant  changes across serial measurements  may suggest ACS but many other  chronic and acute conditions are known to elevate hsTnI results.  Refer to the "Links" section for chest pain algorithms and additional  guidance. Performed at [facility], [address] [place], [place] [address]   Comprehensive metabolic panel     Status: Abnormal   Collection Time: 01/21/19  6:39 PM  Result Value Ref Range   Sodium 137 135 - 145 mmol/L   Potassium 3.4 (L) 3.5 - 5.1 mmol/L   Chloride 101 98 - 111 mmol/L   CO2 22 22 - 32 mmol/L   Glucose, Bld 194 (H) 70 - 99 mg/dL   BUN 23 8 - 23 mg/dL   Creatinine, Ser 0.78 0.61 - 1.24 mg/dL   Calcium 8.8 (L) 8.9 - 10.3 mg/dL   Total Protein 6.7 6.5 - 8.1 g/dL   Albumin 3.9 3.5 - 5.0 g/dL   AST 27 15 - 41 U/L   ALT 26 0 - 44 U/L   Alkaline Phosphatase 49 38 - 126 U/L   Total Bilirubin 0.7 0.3 - 1.2 mg/dL   GFR calc non Af  Amer >60 >60 mL/min   GFR calc Af Amer >60 >60 mL/min   Anion gap 14 5 - 15    Comment: Performed at [facility], [address] [place], [place] [address]  APTT     Status: None   Collection Time: 01/21/19  6:39 PM  Result Value Ref Range   aPTT 28 24 - 36 seconds    Comment: Performed at [facility], [address] [place], [place] [address]  Protime-INR     Status: None   Collection Time: 01/21/19  6:39 PM  Result Value Ref Range   Prothrombin Time 13.7 11.4 - 15.2 seconds   INR 1.1 0.8 - 1.2    Comment: (NOTE) INR goal varies based on device and disease states. Performed at [facility], [address] [place], [place] [address]   Magnesium     Status: None   Collection Time: 01/21/19  6:52 PM  Result Value Ref Range   Magnesium 2.0 1.7 - 2.4 mg/dL    Comment: Performed at [facility], [address] [place], [place] [address]  TSH     Status: Abnormal   Collection Time: 01/21/19  6:52 PM  Result Value Ref Range   TSH 4.729 (H) 0.350 - 4.500 uIU/mL    Comment: Performed by a 3rd Generation assay with a functional sensitivity of <=0.01 uIU/mL. Performed at [facility], [address] [place], [place] [address]   T4, free     Status: None   Collection Time: 01/21/19  6:52 PM  Result Value Ref Range   Free T4 0.87 0.61 - 1.12 ng/dL    Comment: (NOTE) Biotin ingestion may interfere with free T4 tests. If the results are inconsistent with the TSH level, previous test results, or the clinical presentation, then consider biotin interference. If needed, order repeat testing after stopping biotin. Performed at [facility], [address] [place], [place] [address]    Dg Chest Portable 1 View  Result Date: 01/21/2019 CLINICAL DATA:  Chest pain EXAM: PORTABLE CHEST 1 VIEW COMPARISON:  None. FINDINGS: Mild cardiac enlargement. Negative for heart failure or edema. Lungs are well aerated without infiltrate or  effusion. IMPRESSION: No active disease. Electronically Signed   By: Franchot Gallo M.D.   On: 01/21/2019 19:24    Pending Labs Unresulted Labs (From admission,  onward)    Start     Ordered   01/22/19 [id number]  Basic metabolic panel  Tomorrow morning,   STAT     01/21/19 2054   01/22/19 0500  CBC  Tomorrow morning,   STAT     01/21/19 2054   01/21/19 2052  HIV Antibody (routine testing w rflx)  (HIV Antibody (Routine testing w reflex) panel)  Once,   STAT     01/21/19 2054   01/21/19 1942  SARS CORONAVIRUS 2 (TAT 6-24 HRS) Nasopharyngeal Nasopharyngeal Swab  (Asymptomatic/Tier 3)  Once,   STAT    Question Answer Comment  Is this test for diagnosis or screening Screening   Symptomatic for COVID-19 as defined by CDC No   Hospitalized for COVID-19 No   Admitted to ICU for COVID-19 No   Previously tested for COVID-19 No   Resident in a congregate (group) care setting No   Employed in healthcare setting No      01/21/19 1942          Vitals/Pain Today's Vitals   01/21/19 1845 01/21/19 1848 01/21/19 1859 01/21/19 1905  BP: 91/69 135/66 105/68   Pulse: (!) 217 (!) 217 80   Resp: 19 20 15    SpO2: 95% 95% 98%   Weight:      Height:    5\' 7"  (1.702 m)  PainSc: 0-No pain       Isolation Precautions No active isolations  Medications Medications  amiodarone (NEXTERONE PREMIX) 360-4.14 MG/200ML-% (1.8 mg/mL) IV infusion (60 mg/hr Intravenous New Bag/Given 01/21/19 2024)  amiodarone (NEXTERONE PREMIX) 360-4.14 MG/200ML-% (1.8 mg/mL) IV infusion (has no administration in time range)  potassium chloride SA (KLOR-CON) CR tablet 40 mEq (has no administration in time range)  etomidate (AMIDATE) injection 8 mg (8 mg Intravenous Given 01/21/19 1847)    Mobility walks Low fall risk   Focused Assessments Cardiac Assessment Handoff:  Cardiac Rhythm: Normal sinus rhythm No results found for: CKTOTAL, CKMB, CKMBINDEX, TROPONINI No results found for: DDIMER Does the Patient currently  have chest pain? No      R Recommendations: See Admitting Provider Note  Report given to:   Additional Notes: none

## 2019-01-21 NOTE — ED Notes (Signed)
Pt awake, alert, and oriented x4. Nasal cannula removed SpO2 is 98% on RA. States that he is experiencing less pressure and dizziness.

## 2019-01-22 ENCOUNTER — Observation Stay
Admit: 2019-01-22 | Discharge: 2019-01-22 | Disposition: A | Discharge status: Home / Self Care | Payer: Medicare Other | Attending: Family Medicine | Admitting: Family Medicine

## 2019-01-22 DIAGNOSIS — I472 Ventricular tachycardia: Secondary | ICD-10-CM | POA: Diagnosis not present

## 2019-01-22 DIAGNOSIS — R55 Syncope and collapse: Secondary | ICD-10-CM | POA: Diagnosis not present

## 2019-01-22 DIAGNOSIS — I959 Hypotension, unspecified: Secondary | ICD-10-CM | POA: Diagnosis not present

## 2019-01-22 DIAGNOSIS — R42 Dizziness and giddiness: Secondary | ICD-10-CM

## 2019-01-22 LAB — CBC
HCT: 35 % — ABNORMAL LOW (ref 39.0–52.0)
Hemoglobin: 11.8 g/dL — ABNORMAL LOW (ref 13.0–17.0)
MCH: 30.3 pg (ref 26.0–34.0)
MCHC: 33.7 g/dL (ref 30.0–36.0)
MCV: 90 fL (ref 80.0–100.0)
Platelets: 216 10*3/uL (ref 150–400)
RBC: 3.89 MIL/uL — ABNORMAL LOW (ref 4.22–5.81)
RDW: 13.8 % (ref 11.5–15.5)
WBC: 6.6 10*3/uL (ref 4.0–10.5)
nRBC: 0 % (ref 0.0–0.2)

## 2019-01-22 LAB — BASIC METABOLIC PANEL
Anion gap: 8 (ref 5–15)
BUN: 17 mg/dL (ref 8–23)
CO2: 26 mmol/L (ref 22–32)
Calcium: 8.5 mg/dL — ABNORMAL LOW (ref 8.9–10.3)
Chloride: 106 mmol/L (ref 98–111)
Creatinine, Ser: 0.69 mg/dL (ref 0.61–1.24)
GFR calc Af Amer: >60 mL/min (ref >60)
GFR calc non Af Amer: >60 mL/min (ref >60)
Glucose, Bld: 105 mg/dL — ABNORMAL HIGH (ref 70–99)
Potassium: 4.1 mmol/L (ref 3.5–5.1)
Sodium: 140 mmol/L (ref 135–145)

## 2019-01-22 LAB — ECHOCARDIOGRAM COMPLETE
Height: 67 in
Weight: 2992 oz

## 2019-01-22 LAB — HIV ANTIBODY (ROUTINE TESTING W REFLEX): HIV Screen 4th Generation wRfx: NONREACTIVE

## 2019-01-22 LAB — SARS CORONAVIRUS 2 (TAT 6-24 HRS): SARS Coronavirus 2: NEGATIVE

## 2019-01-22 MED ORDER — MAGNESIUM OXIDE 400 (241.3 MG) MG PO TABS: 400.0000 mg | ORAL_TABLET | Freq: Every day | ORAL | 0 refills | Status: AC

## 2019-01-22 MED ORDER — AMIODARONE HCL 200 MG PO TABS
400.0000 mg | ORAL_TABLET | Freq: Two times a day (BID) | ORAL | Status: DC
  Administered 2019-01-22: 400 mg via ORAL
  Filled 2019-01-22: qty 2

## 2019-01-22 MED ORDER — DILTIAZEM HCL ER COATED BEADS 240 MG PO CP24: 240.0000 mg | ORAL_CAPSULE | Freq: Every day | ORAL | 1 refills | Status: DC

## 2019-01-22 MED ORDER — AMIODARONE HCL 400 MG PO TABS: 400.0000 mg | ORAL_TABLET | Freq: Two times a day (BID) | ORAL | 1 refills | Status: DC

## 2019-01-22 MED ORDER — DILTIAZEM HCL ER COATED BEADS 240 MG PO CP24
240.0000 mg | ORAL_CAPSULE | Freq: Every day | ORAL | Status: DC
  Administered 2019-01-22: 240 mg via ORAL
  Filled 2019-01-22: qty 2
  Filled 2019-01-22: qty 1

## 2019-01-22 NOTE — Progress Notes (Signed)
*  PRELIMINARY RESULTS* Echocardiogram 2D Echocardiogram has been performed.  Wallie Char Shauntae Reitman 01/22/2019, 10:25 AM

## 2019-01-22 NOTE — Discharge Summary (Signed)
[facility] NAME: Timothy Wells    MR#:  [id number]  DATE OF BIRTH:  [birth year]  DATE OF ADMISSION:  01/21/2019 ADMITTING PHYSICIAN: Orene Desanctis, DO  DATE OF DISCHARGE: 01/22/2019  PRIMARY CARE PHYSICIAN: Patient, No Pcp Per    ADMISSION DIAGNOSIS:  Wide-complex tachycardia ([place]) [I47.2] Syncope, unspecified syncope type [R55]  DISCHARGE DIAGNOSIS:  Wide complex tachycardia-episodic Afib vs RV outflow tract tachycardia  SECONDARY DIAGNOSIS:   Past Medical History:  Diagnosis Date  . Skin cancer     HOSPITAL COURSE:  Timothy Wells is a 68 y.o. male with medical history significant of paroxysmal atrial fibrillation on Eliquis, CVA with residual mild memory loss, skin basal cell carcinoma who presents with concerns of lightheadedness and dizziness.  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  On EMS arrival, patient was reportedly found to be in wide-complex tachycardia with rates of 225  1. Wide complex Tachycardia--episodic suspect Afib Vs RV outflow tract Tachycardia -pt was cardioverted in the ER and then started on IV amiodarone drip -he was seen by cardiology Dr. Clayborn Bigness and Ubaldo Glassing -patient currently in sinus rhythm during my evaluation heart rate in the 70 -he had a 80+ beat runner of tachycardia yesterday evening and this morning. Patient asymptomatic -mild elevated troponin appears demand ischemia from arrhythmia -no chest pain -patient is on 400 mg BID amiodarone and Cardizem CD was increased to 240  daily -electrocardiography geologist Dr. Mylinda Latina was consulted over the phone by cardiology Dr. Ubaldo Glassing. Does Not need any emergent procedure.  2. History of chronic a fib continue Cardizem CAD, amiodarone(new) and PO eliquis -heart rate much improved  3. Hypokalemia -- came in with  potassium of 3.4--- repleted 4.3  4. History of CVA -continue statins  5 DVT prophylaxis on eliquis  Overall patient is feeling well. He remains in sinus rhythm. Patient will discharge a bit later if he continues to remain stable. Discussed with daughter at bedside  CONSULTS OBTAINED:  Treatment Team:  Teodoro Spray, MD  DRUG ALLERGIES:  No Known Allergies  DISCHARGE MEDICATIONS:   Allergies as of 01/22/2019   No Known Allergies     Medication List    TAKE these medications   amiodarone 400 MG tablet Commonly known as: PACERONE Take 1 tablet (400 mg total) by mouth 2 (two) times daily.   apixaban 5 MG Tabs tablet Commonly known as: ELIQUIS Take 5 mg by mouth 2 (two) times daily.   atorvastatin 80 MG tablet Commonly known as: LIPITOR Take 80 mg by mouth daily.   diltiazem 240 MG 24 hr capsule Commonly known as: CARDIZEM CD Take 1 capsule (240 mg total) by mouth daily. Start taking on: January 23, 2019 What changed:   medication strength  how much to take   magnesium oxide 400 (241.3 Mg) MG tablet Commonly known as: MAG-OX Take 1 tablet (400 mg total) by mouth daily. Start taking on: January 23, 2019   meloxicam 15 MG tablet Commonly known as: MOBIC Take 1 tablet (15 mg total) by mouth daily.   Multi-Vitamin tablet Take 1 tablet by mouth daily.   vitamin E 400 UNIT capsule Take 400 Units by mouth daily.       If you experience worsening of your admission symptoms, develop shortness of breath, life threatening emergency, suicidal or homicidal thoughts you  must seek medical attention immediately by calling 911 or calling your MD immediately  if symptoms less severe.  You Must read complete instructions/literature along with all the possible adverse reactions/side effects for all the Medicines you take and that have been prescribed to you. Take any new Medicines after you have completely understood and accept all the possible adverse reactions/side  effects.   Please note  You were cared for by a hospitalist during your hospital stay. If you have any questions about your discharge medications or the care you received while you were in the hospital after you are discharged, you can call the unit and asked to speak with the hospitalist on call if the hospitalist that took care of you is not available. Once you are discharged, your primary care physician will handle any further medical issues. Please note that NO REFILLS for any discharge medications will be authorized once you are discharged, as it is imperative that you return to your primary care physician (or establish a relationship with a primary care physician if you do not have one) for your aftercare needs so that they can reassess your need for medications and monitor your lab values. Today   SUBJECTIVE   I feel so much better. Denies chest pain or lightheadedness  VITAL SIGNS:  Blood pressure 123/76, pulse 77, temperature 97.8 F (36.6 C), temperature source Oral, resp. rate 19, height 5\' 7"  (1.702 m), weight 84.8 kg, SpO2 97 %.  I/O:    Intake/Output Summary (Last 24 hours) at 01/22/2019 1426 Last data filed at 01/22/2019 1325 Gross per 24 hour  Intake 369.01 ml  Output 1450 ml  Net -1080.99 ml    PHYSICAL EXAMINATION:  GENERAL:  68 y.o.-year-old patient lying in the bed with no acute distress.  EYES: Pupils equal, round, reactive to light and accommodation. No scleral icterus. Extraocular muscles intact.  HEENT: Head atraumatic, normocephalic. Oropharynx and nasopharynx clear.  NECK:  Supple, no jugular venous distention. No thyroid enlargement, no tenderness.  LUNGS: Normal breath sounds bilaterally, no wheezing, rales,rhonchi or crepitation. No use of accessory muscles of respiration.  CARDIOVASCULAR: S1, S2 normal. No murmurs, rubs, or gallops.  ABDOMEN: Soft, non-tender, non-distended. Bowel sounds present. No organomegaly or mass.  EXTREMITIES: No pedal edema,  cyanosis, or clubbing.  NEUROLOGIC: Cranial nerves II through XII are intact. Muscle strength 5/5 in all extremities. Sensation intact. Gait not checked.  PSYCHIATRIC: The patient is alert and oriented x 3.  SKIN: No obvious rash, lesion, or ulcer.   DATA REVIEW:   CBC  Recent Labs  Lab 01/22/19 0436  WBC 6.6  HGB 11.8*  HCT 35.0*  PLT 216    Chemistries  Recent Labs  Lab 01/21/19 1839 01/21/19 1852 01/22/19 0436  NA 137  --  140  K 3.4*  --  4.1  CL 101  --  106  CO2 22  --  26  GLUCOSE 194*  --  105*  BUN 23  --  17  CREATININE 0.78  --  0.69  CALCIUM 8.8*  --  8.5*  MG  --  2.0  --   AST 27  --   --   ALT 26  --   --   ALKPHOS 49  --   --   BILITOT 0.7  --   --     Microbiology Results   Recent Results (from the past 240 hour(s))  SARS CORONAVIRUS 2 (TAT 6-24 HRS) Nasopharyngeal Nasopharyngeal Swab     Status:  None   Collection Time: 01/21/19  8:26 PM   Specimen: Nasopharyngeal Swab  Result Value Ref Range Status   SARS Coronavirus 2 NEGATIVE NEGATIVE Final    Comment: (NOTE) SARS-CoV-2 target nucleic acids are NOT DETECTED. The SARS-CoV-2 RNA is generally detectable in upper and lower respiratory specimens during the acute phase of infection. Negative results do not preclude SARS-CoV-2 infection, do not rule out co-infections with other pathogens, and should not be used as the sole basis for treatment or other patient management decisions. Negative results must be combined with clinical observations, patient history, and epidemiological information. The expected result is Negative. Fact Sheet for Patients: SugarRoll.be Fact Sheet for Healthcare Providers: https://www.woods-mathews.com/ This test is not yet approved or cleared by the [place] FDA and  has been authorized for detection and/or diagnosis of SARS-CoV-2 by FDA under an Emergency Use Authorization (EUA). This EUA will remain  in effect (meaning  this test can be used) for the duration of the COVID-19 declaration under Section 56 4(b)(1) of the Act, 21 U.S.C. section 360bbb-3(b)(1), unless the authorization is terminated or revoked sooner. Performed at [facility], [place] [address], [place], [place] [address]     RADIOLOGY:  Dg Chest Portable 1 View  Result Date: 01/21/2019 CLINICAL DATA:  Chest pain EXAM: PORTABLE CHEST 1 VIEW COMPARISON:  None. FINDINGS: Mild cardiac enlargement. Negative for heart failure or edema. Lungs are well aerated without infiltrate or effusion. IMPRESSION: No active disease. Electronically Signed   By: Franchot Gallo M.D.   On: 01/21/2019 19:24     CODE STATUS:     Code Status Orders  (From admission, onward)         Start     Ordered   01/21/19 2054  Full code  Continuous     01/21/19 2054        Code Status History    This patient has a current code status but no historical code status.   Advance Care Planning Activity     Family Discussion: daughter at bedside Consults: cardiology Dr. Assunta Curtis   TOTAL TIME TAKING CARE OF THIS PATIENT: *40* minutes.    Fritzi Mandes M.D on 01/22/2019 at 2:26 PM  Between 7am to 6pm - Pager - [phone] After 6pm go to [email or web address] - password [email or web address]  [facility]    CC: Primary care physician; Patient, No Pcp Per

## 2019-01-22 NOTE — Progress Notes (Signed)
Ambulated patient around nurses station, patient tolerated it well. No complaints of dizziness and light headedness. Ok to discharge per MD. Asked for a work note and will print once MD place it.

## 2019-01-22 NOTE — Consult Note (Signed)
Cardiology Consultation Note    Patient ID: JRU RANDEL, MRN: [id number], DOB/AGE: 28-Dec-1950 68 y.o. Admit date: 01/21/2019   Date of Consult: 01/22/2019 Primary Physician: Patient, No Pcp Per Primary Cardiologist: Dr. Clayborn Bigness  Chief Complaint: dizzyness Reason for Consultation: Wide complex tachycardia Requesting MD: Dr. Fritzi Mandes   HPI: Timothy Wells is a 68 y.o. male with history of CVA atrial fibrillation, anemia, ischemic stroke in the past with paroxysmal A. fib patient currently on Eliquis for anticoagulation and is on Cartia for rate control. The patient has hyperlipidemia treated with Lipitor .  Patient was admitted after presenting to the emergency room with complaints of episodic dizziness.  EMS was called and found him to be in a wide-complex tachycardia.  He was given a bolus of amiodarone without improvement.  Presenting to the emergency room he was still in the wide-complex tachycardia.  He was fairly well hemodynamically stable.  Based on the rhythm he was cardioverted to sinus rhythm and placed on amiodarone drip.  He was continue with Eliquis.  Potassium was 3.4 magnesium was normal.  He has had several runs of approximately 80 beats of wide-complex tachycardia.  This occurs when standing up.  He is fairly asymptomatic from it.  Reviewed by EP suggested this was apparent A. fib versus RV outflow tract tachycardia.  Given normal LV and no evidence of ischemia by his functional study, while patient may be symptomatic or dizzy, this is not a life-threatening arrhythmia.  Patient is ruled out for myocardial infarction.   Past Medical History:  Diagnosis Date  . Skin cancer       Surgical History: History reviewed. No pertinent surgical history.   Home Meds: Prior to Admission medications   Medication Sig Start Date End Date Taking? Authorizing Provider  apixaban (ELIQUIS) 5 MG TABS tablet Take 5 mg by mouth 2 (two) times daily. 10/17/18  Yes [provider]   atorvastatin (LIPITOR) 80 MG tablet Take 80 mg by mouth daily. 07/02/17  Yes [provider]  diltiazem (CARDIZEM CD) 120 MG 24 hr capsule Take 120 mg by mouth daily. 07/02/17  Yes [provider]  meloxicam (MOBIC) 15 MG tablet Take 1 tablet (15 mg total) by mouth daily. 05/24/17  Yes Coral Spikes, DO  Multiple Vitamin (MULTI-VITAMIN) tablet Take 1 tablet by mouth daily.   Yes [provider]  vitamin E 400 UNIT capsule Take 400 Units by mouth daily.   Yes [provider]    Inpatient Medications:  . apixaban  5 mg Oral BID  . atorvastatin  80 mg Oral Daily  . diltiazem  60 mg Oral Q6H  . magnesium oxide  400 mg Oral Daily   . amiodarone 30 mg/hr (01/22/19 0700)    Allergies: No Known Allergies  Social History   Socioeconomic History  . Marital status: Divorced    Spouse name: Not on file  . Number of children: Not on file  . Years of education: Not on file  . Highest education level: Not on file  Occupational History  . Not on file  Social Needs  . Financial resource strain: Not on file  . Food insecurity    Worry: Not on file    Inability: Not on file  . Transportation needs    Medical: Not on file    Non-medical: Not on file  Tobacco Use  . Smoking status: Never Smoker  . Smokeless tobacco: Never Used  Substance and Sexual Activity  .  Alcohol use: Yes    Comment: rare  . Drug use: Not Currently  . Sexual activity: Not on file  Lifestyle  . Physical activity    Days per week: Not on file    Minutes per session: Not on file  . Stress: Not on file  Relationships  . Social Herbalist on phone: Not on file    Gets together: Not on file    Attends religious service: Not on file    Active member of club or organization: Not on file    Attends meetings of clubs or organizations: Not on file    Relationship status: Not on file  . Intimate partner violence    Fear of current or ex partner: Not on file    Emotionally  abused: Not on file    Physically abused: Not on file    Forced sexual activity: Not on file  Other Topics Concern  . Not on file  Social History Narrative  . Not on file     History reviewed. No pertinent family history.   Review of Systems: A 12-system review of systems was performed and is negative except as noted in the HPI.  Labs: No results for input(s): CKTOTAL, CKMB, TROPONINI in the last 72 hours. Lab Results  Component Value Date   WBC 6.6 01/22/2019   HGB 11.8 (L) 01/22/2019   HCT 35.0 (L) 01/22/2019   MCV 90.0 01/22/2019   PLT 216 01/22/2019    Recent Labs  Lab 01/21/19 1839 01/22/19 0436  NA 137 140  K 3.4* 4.1  CL 101 106  CO2 22 26  BUN 23 17  CREATININE 0.78 0.69  CALCIUM 8.8* 8.5*  PROT 6.7  --   BILITOT 0.7  --   ALKPHOS 49  --   ALT 26  --   AST 27  --   GLUCOSE 194* 105*   No results found for: CHOL, HDL, LDLCALC, TRIG No results found for: DDIMER  Radiology/Studies:  Dg Chest Portable 1 View  Result Date: 01/21/2019 CLINICAL DATA:  Chest pain EXAM: PORTABLE CHEST 1 VIEW COMPARISON:  None. FINDINGS: Mild cardiac enlargement. Negative for heart failure or edema. Lungs are well aerated without infiltrate or effusion. IMPRESSION: No active disease. Electronically Signed   By: Franchot Gallo M.D.   On: 01/21/2019 19:24    Wt Readings from Last 3 Encounters:  01/21/19 84.8 kg  05/24/17 83.9 kg  02/05/15 77.1 kg    EKG: Sinus tachycardia  Physical Exam:  Blood pressure (!) 112/93, pulse 75, temperature 98 F (36.7 C), temperature source Oral, resp. rate 20, height 5\' 7"  (1.702 m), weight 84.8 kg, SpO2 98 %. Body mass index is 29.29 kg/m. General: Well developed, well nourished, in no acute distress. Head: Normocephalic, atraumatic, sclera non-icteric, no xanthomas, nares are without discharge.  Neck: Negative for carotid bruits. JVD not elevated. Lungs: Clear bilaterally to auscultation without wheezes, rales, or rhonchi. Breathing is  unlabored. Heart: RRR with S1 S2. No murmurs, rubs, or gallops appreciated. Abdomen: Soft, non-tender, non-distended with normoactive bowel sounds. No hepatomegaly. No rebound/guarding. No obvious abdominal masses. Msk:  Strength and tone appear normal for age. Extremities: No clubbing or cyanosis. No edema.  Distal pedal pulses are 2+ and equal bilaterally. Neuro: Alert and oriented X 3. No facial asymmetry. No focal deficit. Moves all extremities spontaneously. Psych:  Responds to questions appropriately with a normal affect.     Assessment and Plan  Patient with history  of paroxysmal atrial fibrillation on Eliquis and Cardizem admitted with dizziness and noted to have episodic wide-complex tachycardia.  Reviewed by electrophysiology chest this was aberrant A. fib versus RV outflow tract tachycardia.  There is no active change at which tach suggest ventricular tachycardia.  This is a relatively stable rhythm although could be symptomatic, is not affecting rhythm.  Will increase Cardizem to 240 mg daily convert amiodarone to p.o. at 400 twice daily and continue load.  We will continue with Eliquis.  If stable would consider discharge to home with outpatient follow-up with Dr. Clayborn Bigness next week.  Signed, Teodoro Spray MD 01/22/2019, 7:10 AM Pager: [phone]

## 2019-01-22 NOTE — Plan of Care (Signed)
  Problem: Education: Goal: Knowledge of General Education information will improve Description: Including pain rating scale, medication(s)/side effects and non-pharmacologic comfort measures Outcome: Progressing   Problem: Health Behavior/Discharge Planning: Goal: Ability to manage health-related needs will improve Outcome: Progressing   Problem: Clinical Measurements: Goal: Ability to maintain clinical measurements within normal limits will improve Outcome: Progressing Goal: Will remain free from infection Outcome: Progressing Note: Remains afebrile Goal: Diagnostic test results will improve Outcome: Progressing Goal: Respiratory complications will improve Outcome: Progressing   Problem: Activity: Goal: Risk for activity intolerance will decrease Outcome: Progressing   Problem: Nutrition: Goal: Adequate nutrition will be maintained Outcome: Progressing   Problem: Coping: Goal: Level of anxiety will decrease Outcome: Progressing   Problem: Elimination: Goal: Will not experience complications related to bowel motility Outcome: Progressing Goal: Will not experience complications related to urinary retention Outcome: Progressing   Problem: Pain Managment: Goal: General experience of comfort will improve Outcome: Progressing   Problem: Safety: Goal: Ability to remain free from injury will improve Outcome: Progressing   Problem: Skin Integrity: Goal: Risk for impaired skin integrity will decrease Outcome: Progressing   Problem: Education: Goal: Knowledge of disease or condition will improve Outcome: Progressing Goal: Understanding of medication regimen will improve Outcome: Progressing Goal: Individualized Educational Video(s) Outcome: Progressing   Problem: Activity: Goal: Ability to tolerate increased activity will improve Outcome: Progressing   Problem: Cardiac: Goal: Ability to achieve and maintain adequate cardiopulmonary perfusion will  improve Outcome: Progressing   Problem: Health Behavior/Discharge Planning: Goal: Ability to safely manage health-related needs after discharge will improve Outcome: Progressing   Problem: Clinical Measurements: Goal: Cardiovascular complication will be avoided Outcome: Not Progressing Note: Continues on Amiodarone gtt, had 83 beat VT last night

## 2019-01-22 NOTE — Progress Notes (Signed)
Went over discharge instruction with the patient and daughter about medication and follow-up appointment. Discontinue PIV and telemetry monitor.

## 2019-01-22 NOTE — Discharge Instructions (Addendum)
Patient advised not to do any heavy lifting  for few days

## 2019-03-10 DIAGNOSIS — I480 Paroxysmal atrial fibrillation: Secondary | ICD-10-CM | POA: Diagnosis not present

## 2019-03-10 DIAGNOSIS — I639 Cerebral infarction, unspecified: Secondary | ICD-10-CM | POA: Diagnosis not present

## 2019-03-10 DIAGNOSIS — I10 Essential (primary) hypertension: Secondary | ICD-10-CM | POA: Diagnosis not present

## 2019-03-10 DIAGNOSIS — E7849 Other hyperlipidemia: Secondary | ICD-10-CM | POA: Diagnosis not present

## 2019-03-10 DIAGNOSIS — R0789 Other chest pain: Secondary | ICD-10-CM | POA: Diagnosis not present

## 2019-03-10 DIAGNOSIS — R600 Localized edema: Secondary | ICD-10-CM | POA: Diagnosis not present

## 2019-03-10 DIAGNOSIS — I479 Paroxysmal tachycardia, unspecified: Secondary | ICD-10-CM | POA: Diagnosis not present

## 2019-03-10 DIAGNOSIS — R002 Palpitations: Secondary | ICD-10-CM | POA: Diagnosis not present

## 2019-03-21 ENCOUNTER — Other Ambulatory Visit
Admission: RE | Admit: 2019-03-21 | Discharge: 2019-03-21 | Disposition: A | Discharge status: Home / Self Care | Payer: PPO | Source: Ambulatory Visit | Attending: Internal Medicine | Admitting: Internal Medicine

## 2019-03-21 ENCOUNTER — Other Ambulatory Visit: Payer: Self-pay

## 2019-03-21 DIAGNOSIS — Z01818 Encounter for other preprocedural examination: Secondary | ICD-10-CM | POA: Diagnosis not present

## 2019-03-21 DIAGNOSIS — Z20822 Contact with and (suspected) exposure to covid-19: Secondary | ICD-10-CM | POA: Insufficient documentation

## 2019-03-21 DIAGNOSIS — Z01812 Encounter for preprocedural laboratory examination: Secondary | ICD-10-CM | POA: Diagnosis not present

## 2019-03-21 LAB — BRAIN NATRIURETIC PEPTIDE: B Natriuretic Peptide: 62 pg/mL (ref 0.0–100.0)

## 2019-03-22 LAB — SARS CORONAVIRUS 2 (TAT 6-24 HRS): SARS Coronavirus 2: NEGATIVE

## 2019-03-25 ENCOUNTER — Ambulatory Visit: Payer: PPO | Admitting: Anesthesiology

## 2019-03-25 ENCOUNTER — Encounter
Admission: RE | Disposition: A | Discharge status: Home / Self Care | Payer: Self-pay | Source: Home / Self Care | Attending: Internal Medicine

## 2019-03-25 ENCOUNTER — Ambulatory Visit
Admission: RE | Admit: 2019-03-25 | Discharge: 2019-03-25 | Disposition: A | Discharge status: Home / Self Care | Payer: PPO | Attending: Internal Medicine | Admitting: Internal Medicine

## 2019-03-25 ENCOUNTER — Other Ambulatory Visit: Payer: Self-pay

## 2019-03-25 DIAGNOSIS — K219 Gastro-esophageal reflux disease without esophagitis: Secondary | ICD-10-CM | POA: Insufficient documentation

## 2019-03-25 DIAGNOSIS — I69311 Memory deficit following cerebral infarction: Secondary | ICD-10-CM | POA: Insufficient documentation

## 2019-03-25 DIAGNOSIS — Z79899 Other long term (current) drug therapy: Secondary | ICD-10-CM | POA: Diagnosis not present

## 2019-03-25 DIAGNOSIS — R609 Edema, unspecified: Secondary | ICD-10-CM | POA: Diagnosis not present

## 2019-03-25 DIAGNOSIS — I4891 Unspecified atrial fibrillation: Secondary | ICD-10-CM | POA: Diagnosis not present

## 2019-03-25 DIAGNOSIS — E7849 Other hyperlipidemia: Secondary | ICD-10-CM | POA: Insufficient documentation

## 2019-03-25 DIAGNOSIS — Z85828 Personal history of other malignant neoplasm of skin: Secondary | ICD-10-CM | POA: Insufficient documentation

## 2019-03-25 DIAGNOSIS — Z8673 Personal history of transient ischemic attack (TIA), and cerebral infarction without residual deficits: Secondary | ICD-10-CM | POA: Diagnosis not present

## 2019-03-25 DIAGNOSIS — I959 Hypotension, unspecified: Secondary | ICD-10-CM | POA: Diagnosis not present

## 2019-03-25 DIAGNOSIS — I10 Essential (primary) hypertension: Secondary | ICD-10-CM | POA: Diagnosis not present

## 2019-03-25 DIAGNOSIS — I4819 Other persistent atrial fibrillation: Secondary | ICD-10-CM | POA: Insufficient documentation

## 2019-03-25 DIAGNOSIS — Z7901 Long term (current) use of anticoagulants: Secondary | ICD-10-CM | POA: Insufficient documentation

## 2019-03-25 HISTORY — PX: CARDIOVERSION: SHX1299

## 2019-03-25 SURGERY — CARDIOVERSION
Anesthesia: General

## 2019-03-25 MED ORDER — PROPOFOL 10 MG/ML IV BOLUS
INTRAVENOUS | Status: DC | PRN
  Administered 2019-03-25: 80 mg via INTRAVENOUS

## 2019-03-25 MED ORDER — SODIUM CHLORIDE 0.9 % IV SOLN: INTRAVENOUS | Status: DC | PRN

## 2019-03-25 MED ORDER — PROPOFOL 10 MG/ML IV BOLUS
INTRAVENOUS | Status: AC
  Filled 2019-03-25: qty 20

## 2019-03-25 NOTE — Anesthesia Preprocedure Evaluation (Addendum)
Anesthesia Evaluation  Patient identified by MRN, date of birth, ID band  Reviewed: Allergy & Precautions, NPO status , Patient's Chart, lab work & pertinent test results  History of Anesthesia Complications Negative for: history of anesthetic complications  Airway Mallampati: II       Dental  (+) Partial Upper   Pulmonary neg sleep apnea, neg COPD, Not current smoker,           Cardiovascular hypertension, Pt. on medications (-) Past MI and (-) CHF + dysrhythmias Atrial Fibrillation + Valvular Problems/Murmurs (murmur as a child)      Neuro/Psych neg Seizures TIA (slurred speech for a few minutes)   GI/Hepatic Neg liver ROS, GERD  Medicated and Controlled,  Endo/Other  neg diabetes  Renal/GU negative Renal ROS     Musculoskeletal   Abdominal   Peds  Hematology   Anesthesia Other Findings   Reproductive/Obstetrics                            Anesthesia Physical Anesthesia Plan  ASA: III  Anesthesia Plan: General   Post-op Pain Management:    Induction: Intravenous  PONV Risk Score and Plan: 2 and Propofol infusion and TIVA  Airway Management Planned: Nasal Cannula  Additional Equipment:   Intra-op Plan:   Post-operative Plan:   Informed Consent: I have reviewed the patients History and Physical, chart, labs and discussed the procedure including the risks, benefits and alternatives for the proposed anesthesia with the patient or authorized representative who has indicated his/her understanding and acceptance.       Plan Discussed with:   Anesthesia Plan Comments:         Anesthesia Quick Evaluation

## 2019-03-25 NOTE — Anesthesia Procedure Notes (Signed)
Performed by: Umaima Scholten, CRNA Pre-anesthesia Checklist: Patient identified, Emergency Drugs available, Suction available and Patient being monitored Patient Re-evaluated:Patient Re-evaluated prior to induction Oxygen Delivery Method: Nasal cannula Induction Type: IV induction Dental Injury: Teeth and Oropharynx as per pre-operative assessment  Comments: Nasal cannula with etCO2 monitoring       

## 2019-03-25 NOTE — CV Procedure (Signed)
Elective cardioversion for atrial fibrillation as outpatient  Patient is brought to [facility] for outpatient elective cardioversion for atrial fibrillation Patient has been on Eliquis for over 4 weeks and is adequately anticoagulated Patient has been on rate control drugs as well Presented with stable vital signs Anesthesia was here to assist with sedation Heart rate was 110 atrial fibrillation Patient was hooked up in the usual fashion to synchronized biphasic cardioversion He was set at 120 J After the patient was adequately sedated He was cardioverted with 120 J requiring only 1 shock Patient converted to sinus rhythm at 70 Patient denied any significant complications Post procedure EKG suggested normal sinus rhythm rate of 70 Patient is to be recovered in [facility] recovery and discharged once discharge criteria met Patient is to follow-up as outpatient in my office in 1 to 2 weeks He is to continue Eliquis indefinitely as well as rate control . Conclusion Successful cardioversion from atrial fibrillation to sinus rhythm

## 2019-03-25 NOTE — Anesthesia Postprocedure Evaluation (Signed)
Anesthesia Post Note  Patient: Timothy Wells  Procedure(s) Performed: CARDIOVERSION (N/A )  Patient location during evaluation: [facility] Anesthesia Type: General Level of consciousness: awake and alert Pain management: pain level controlled Vital Signs Assessment: post-procedure vital signs reviewed and stable Respiratory status: spontaneous breathing and respiratory function stable Cardiovascular status: stable Anesthetic complications: no     Last Vitals:  Vitals:   03/25/19 0725 03/25/19 0816  BP: (!) 132/104 99/67  Pulse: (!) 126   Resp: 20   Temp: 36.7 C   SpO2: 100%     Last Pain:  Vitals:   03/25/19 0725  TempSrc: Oral                 Timothy Wells

## 2019-03-25 NOTE — Procedures (Signed)
Electrical Cardioversion Procedure Note   Procedure: Electrical Cardioversion Indications:  Atrial Fibrillation  Procedure Details Consent: Risks of procedure as well as the alternatives and risks of each were explained to the (patient/caregiver).  Consent for procedure obtained. Time Out: Verified patient identification, verified procedure, site/side was marked, verified correct patient position, special equipment/implants available, medications/allergies/relevent history reviewed, required imaging and test results available.  Performed  Patient placed on cardiac monitor, pulse oximetry, supplemental oxygen as necessary.  Sedation given: As per anesthesia Pacer pads placed anterior and posterior chest.  Cardioverted 1 time(s).  Cardioverted at 120J.  Evaluation Findings: Post procedure EKG shows: NSR Complications: None Patient did  tolerate procedure well.   Leovardo Thoman 03/25/2019 08:15

## 2019-03-25 NOTE — Transfer of Care (Signed)
Immediate Anesthesia Transfer of Care Note  Patient: Timothy Wells  Procedure(s) Performed: Procedure(s): CARDIOVERSION (N/A)  Patient Location: PACU and Short Stay  Anesthesia Type:General  Level of Consciousness: awake, alert  and oriented  Airway & Oxygen Therapy: Patient Spontanous Breathing and Patient connected to nasal cannula oxygen  Post-op Assessment: Report given to RN and Post -op Vital signs reviewed and stable  Post vital signs: Reviewed and stable  Last Vitals:  Vitals:   03/25/19 0725 03/25/19 0816  BP: (!) 132/104 99/67  Pulse: (!) 126   Resp: 20   Temp: 36.7 C   SpO2: 123XX123     Complications: No apparent anesthesia complications

## 2019-03-25 NOTE — Progress Notes (Signed)
Dr. Clayborn Bigness spoke with pt. At bedside, then spoke with pt. Daughter re: procedure, activities, follow-up. RN spoke extensively with pt.& Daughter Claiborne Billings re: DC instructions. Both verbalized understanding of conversation.

## 2019-04-01 DIAGNOSIS — R600 Localized edema: Secondary | ICD-10-CM | POA: Diagnosis not present

## 2019-04-01 DIAGNOSIS — R002 Palpitations: Secondary | ICD-10-CM | POA: Diagnosis not present

## 2019-04-01 DIAGNOSIS — Z8673 Personal history of transient ischemic attack (TIA), and cerebral infarction without residual deficits: Secondary | ICD-10-CM | POA: Diagnosis not present

## 2019-04-01 DIAGNOSIS — E7849 Other hyperlipidemia: Secondary | ICD-10-CM | POA: Diagnosis not present

## 2019-04-01 DIAGNOSIS — R079 Chest pain, unspecified: Secondary | ICD-10-CM | POA: Diagnosis not present

## 2019-04-01 DIAGNOSIS — Z9889 Other specified postprocedural states: Secondary | ICD-10-CM | POA: Diagnosis not present

## 2019-04-01 DIAGNOSIS — I480 Paroxysmal atrial fibrillation: Secondary | ICD-10-CM | POA: Diagnosis not present

## 2019-04-01 DIAGNOSIS — I479 Paroxysmal tachycardia, unspecified: Secondary | ICD-10-CM | POA: Diagnosis not present

## 2019-04-08 DIAGNOSIS — D649 Anemia, unspecified: Secondary | ICD-10-CM | POA: Diagnosis not present

## 2019-04-08 DIAGNOSIS — E7849 Other hyperlipidemia: Secondary | ICD-10-CM | POA: Diagnosis not present

## 2019-04-08 DIAGNOSIS — R413 Other amnesia: Secondary | ICD-10-CM | POA: Diagnosis not present

## 2019-04-08 DIAGNOSIS — I480 Paroxysmal atrial fibrillation: Secondary | ICD-10-CM | POA: Diagnosis not present

## 2019-04-08 DIAGNOSIS — E559 Vitamin D deficiency, unspecified: Secondary | ICD-10-CM | POA: Diagnosis not present

## 2019-04-08 DIAGNOSIS — R002 Palpitations: Secondary | ICD-10-CM | POA: Diagnosis not present

## 2019-04-08 DIAGNOSIS — Z7189 Other specified counseling: Secondary | ICD-10-CM | POA: Diagnosis not present

## 2019-04-08 DIAGNOSIS — Z7689 Persons encountering health services in other specified circumstances: Secondary | ICD-10-CM | POA: Diagnosis not present

## 2019-04-08 DIAGNOSIS — Z1329 Encounter for screening for other suspected endocrine disorder: Secondary | ICD-10-CM | POA: Diagnosis not present

## 2019-04-08 DIAGNOSIS — Z0000 Encounter for general adult medical examination without abnormal findings: Secondary | ICD-10-CM | POA: Diagnosis not present

## 2019-04-08 DIAGNOSIS — R600 Localized edema: Secondary | ICD-10-CM | POA: Diagnosis not present

## 2019-04-08 DIAGNOSIS — Z9889 Other specified postprocedural states: Secondary | ICD-10-CM | POA: Diagnosis not present

## 2019-04-08 DIAGNOSIS — Z79899 Other long term (current) drug therapy: Secondary | ICD-10-CM | POA: Diagnosis not present

## 2019-04-08 DIAGNOSIS — Z125 Encounter for screening for malignant neoplasm of prostate: Secondary | ICD-10-CM | POA: Diagnosis not present

## 2019-04-08 DIAGNOSIS — I479 Paroxysmal tachycardia, unspecified: Secondary | ICD-10-CM | POA: Diagnosis not present

## 2019-04-08 DIAGNOSIS — Z1321 Encounter for screening for nutritional disorder: Secondary | ICD-10-CM | POA: Diagnosis not present

## 2019-04-08 DIAGNOSIS — Z131 Encounter for screening for diabetes mellitus: Secondary | ICD-10-CM | POA: Diagnosis not present

## 2019-04-08 DIAGNOSIS — R7309 Other abnormal glucose: Secondary | ICD-10-CM | POA: Diagnosis not present

## 2019-04-08 DIAGNOSIS — Z23 Encounter for immunization: Secondary | ICD-10-CM | POA: Diagnosis not present

## 2019-04-08 DIAGNOSIS — R079 Chest pain, unspecified: Secondary | ICD-10-CM | POA: Diagnosis not present

## 2019-04-08 DIAGNOSIS — Z8673 Personal history of transient ischemic attack (TIA), and cerebral infarction without residual deficits: Secondary | ICD-10-CM | POA: Diagnosis not present

## 2019-04-08 DIAGNOSIS — I472 Ventricular tachycardia: Secondary | ICD-10-CM | POA: Diagnosis not present

## 2019-04-09 DIAGNOSIS — R002 Palpitations: Secondary | ICD-10-CM | POA: Diagnosis not present

## 2019-04-09 DIAGNOSIS — R600 Localized edema: Secondary | ICD-10-CM | POA: Diagnosis not present

## 2019-04-09 DIAGNOSIS — I480 Paroxysmal atrial fibrillation: Secondary | ICD-10-CM | POA: Diagnosis not present

## 2019-04-09 DIAGNOSIS — Z8673 Personal history of transient ischemic attack (TIA), and cerebral infarction without residual deficits: Secondary | ICD-10-CM | POA: Diagnosis not present

## 2019-04-09 DIAGNOSIS — I479 Paroxysmal tachycardia, unspecified: Secondary | ICD-10-CM | POA: Diagnosis not present

## 2019-04-09 DIAGNOSIS — Z9889 Other specified postprocedural states: Secondary | ICD-10-CM | POA: Diagnosis not present

## 2019-04-09 DIAGNOSIS — R079 Chest pain, unspecified: Secondary | ICD-10-CM | POA: Diagnosis not present

## 2019-04-09 DIAGNOSIS — E7849 Other hyperlipidemia: Secondary | ICD-10-CM | POA: Diagnosis not present

## 2019-04-09 DIAGNOSIS — R0602 Shortness of breath: Secondary | ICD-10-CM | POA: Diagnosis not present

## 2019-04-09 DIAGNOSIS — I10 Essential (primary) hypertension: Secondary | ICD-10-CM | POA: Diagnosis not present

## 2019-04-10 DIAGNOSIS — E119 Type 2 diabetes mellitus without complications: Secondary | ICD-10-CM

## 2019-04-10 HISTORY — DX: Type 2 diabetes mellitus without complications: E11.9

## 2019-04-26 ENCOUNTER — Other Ambulatory Visit: Payer: Self-pay

## 2019-04-26 ENCOUNTER — Emergency Department: Payer: PPO

## 2019-04-26 ENCOUNTER — Emergency Department
Admission: EM | Admit: 2019-04-26 | Discharge: 2019-04-26 | Disposition: A | Discharge status: Home / Self Care | Payer: PPO | Attending: Emergency Medicine | Admitting: Emergency Medicine

## 2019-04-26 ENCOUNTER — Encounter: Payer: Self-pay | Admitting: Emergency Medicine

## 2019-04-26 DIAGNOSIS — Z79899 Other long term (current) drug therapy: Secondary | ICD-10-CM | POA: Insufficient documentation

## 2019-04-26 DIAGNOSIS — Z85828 Personal history of other malignant neoplasm of skin: Secondary | ICD-10-CM | POA: Insufficient documentation

## 2019-04-26 DIAGNOSIS — Z7901 Long term (current) use of anticoagulants: Secondary | ICD-10-CM | POA: Diagnosis not present

## 2019-04-26 DIAGNOSIS — R079 Chest pain, unspecified: Secondary | ICD-10-CM | POA: Insufficient documentation

## 2019-04-26 HISTORY — DX: Unspecified atrial fibrillation: I48.91

## 2019-04-26 LAB — BASIC METABOLIC PANEL
Anion gap: 11 (ref 5–15)
BUN: 18 mg/dL (ref 8–23)
CO2: 24 mmol/L (ref 22–32)
Calcium: 9.4 mg/dL (ref 8.9–10.3)
Chloride: 95 mmol/L — ABNORMAL LOW (ref 98–111)
Creatinine, Ser: 0.8 mg/dL (ref 0.61–1.24)
GFR calc Af Amer: >60 mL/min (ref >60)
GFR calc non Af Amer: >60 mL/min (ref >60)
Glucose, Bld: 120 mg/dL — ABNORMAL HIGH (ref 70–99)
Potassium: 4.5 mmol/L (ref 3.5–5.1)
Sodium: 130 mmol/L — ABNORMAL LOW (ref 135–145)

## 2019-04-26 LAB — TROPONIN I (HIGH SENSITIVITY)
Troponin I (High Sensitivity): 19 ng/L — ABNORMAL HIGH (ref <18)
Troponin I (High Sensitivity): 21 ng/L — ABNORMAL HIGH (ref <18)
Troponin I (High Sensitivity): 22 ng/L — ABNORMAL HIGH (ref <18)

## 2019-04-26 LAB — CBC
HCT: 39.8 % (ref 39.0–52.0)
Hemoglobin: 13.9 g/dL (ref 13.0–17.0)
MCH: 30.6 pg (ref 26.0–34.0)
MCHC: 34.9 g/dL (ref 30.0–36.0)
MCV: 87.7 fL (ref 80.0–100.0)
Platelets: 273 10*3/uL (ref 150–400)
RBC: 4.54 MIL/uL (ref 4.22–5.81)
RDW: 14.1 % (ref 11.5–15.5)
WBC: 11.1 10*3/uL — ABNORMAL HIGH (ref 4.0–10.5)
nRBC: 0 % (ref 0.0–0.2)

## 2019-04-26 MED ORDER — SODIUM CHLORIDE 0.9 % IV BOLUS
1000.0000 mL | Freq: Once | INTRAVENOUS | Status: AC
  Administered 2019-04-26: 1000 mL via INTRAVENOUS

## 2019-04-26 NOTE — Discharge Instructions (Signed)
You likely had side effect of the vaccine   See your cardiologist   Continue current meds   Return to ER if you have worse chest pain, trouble breathing

## 2019-04-26 NOTE — ED Triage Notes (Signed)
Pt arrived via POV with reports of chest pain that started around 3pm, Pt states he got the Moderna Covid-19 vaccine today around 1130am. Pt states the pain was intermittent since 3pm. Denies any pain on arrival.   Pt seen by EMS and had EKG done and elevated BP was noted.  Pt states he hasn't had anything to eat since this morning.

## 2019-04-26 NOTE — ED Notes (Signed)
Meal tray and po fluids provided. Call bell at left side.

## 2019-04-26 NOTE — ED Provider Notes (Signed)
[facility] EMERGENCY DEPARTMENT Provider Note   CSN: [id number] Arrival date & time: 04/26/19  1608     History Chief Complaint  Patient presents with  . Chest Pain    OAKS CRONISTER is a 69 y.o. male history of atrial fibrillation on Eliquis, here presenting with chest pain.  Patient states that he had a Covid shot earlier today.  He then subsequently developed chest pain around 3 PM .  States that it is intermittent and not worse with exertion.  He states that it is a burning sensation .  Denies any spicy food or vomiting or fevers.  Patient had a recent cardioversion done by cardiology here.  The history is provided by the patient.       Past Medical History:  Diagnosis Date  . A-fib ([place])   . Skin cancer     Patient Active Problem List   Diagnosis Date Noted  . Dizziness   . Wide-complex tachycardia ([place]) 01/21/2019  . Hypotension 01/21/2019  . History of CVA (cerebrovascular accident) 01/21/2019    Past Surgical History:  Procedure Laterality Date  . CARDIOVERSION N/A 03/25/2019   Procedure: CARDIOVERSION;  Surgeon: Yolonda Kida, MD;  Location: [facility] ORS;  Service: Cardiovascular;  Laterality: N/A;       No family history on file.  Social History   Tobacco Use  . Smoking status: Never Smoker  . Smokeless tobacco: Never Used  Substance Use Topics  . Alcohol use: Yes    Comment: rare  . Drug use: Not Currently    Home Medications Prior to Admission medications   Medication Sig Start Date End Date Taking? Authorizing Provider  amiodarone (PACERONE) 400 MG tablet Take 1 tablet (400 mg total) by mouth 2 (two) times daily. Patient taking differently: Take 400 mg by mouth daily.  01/22/19   Fritzi Mandes, MD  apixaban (ELIQUIS) 5 MG TABS tablet Take 5 mg by mouth 2 (two) times daily. 10/17/18   [provider]  Ascorbic Acid (VITAMIN C WITH ROSE HIPS) 1000 MG tablet Take 1,000 mg by mouth daily.    [provider]    atorvastatin (LIPITOR) 80 MG tablet Take 80 mg by mouth every evening.  07/02/17   [provider]  Cholecalciferol (VITAMIN D3 SUPER STRENGTH) 50 MCG (2000 UT) CAPS Take 2,000 Units by mouth daily.    [provider]  Chromium-Cinnamon (CINNAMON PLUS CHROMIUM PO) Take 1 tablet by mouth 2 (two) times daily.    [provider]  CRANBERRY EXTRACT PO Take 500 mg by mouth daily.    [provider]  diltiazem (CARDIZEM CD) 240 MG 24 hr capsule Take 1 capsule (240 mg total) by mouth daily. 01/23/19   Fritzi Mandes, MD  furosemide (LASIX) 20 MG tablet Take 20 mg by mouth daily.  01/30/19   [provider]  Ginkgo Biloba 60 MG TABS Take 60 mg by mouth 2 (two) times daily.    [provider]  Glucosamine-Chondroitin (COSAMIN DS PO) Take 1 tablet by mouth 2 (two) times daily.    [provider]  magnesium oxide (MAG-OX) 400 (241.3 Mg) MG tablet Take 1 tablet (400 mg total) by mouth daily. Patient not taking: Reported on 03/19/2019 01/23/19   Fritzi Mandes, MD  meloxicam (MOBIC) 15 MG tablet Take 1 tablet (15 mg total) by mouth daily. Patient not taking: Reported on 03/19/2019 05/24/17   Coral Spikes, DO  Multiple Vitamin (MULTIVITAMIN WITH MINERALS) TABS tablet Take 1  tablet by mouth daily.    [provider]  Omega-3 Fatty Acids (FISH OIL) 1000 MG CAPS Take 1,000 mg by mouth daily.    [provider]  omeprazole (PRILOSEC) 20 MG capsule Take 20 mg by mouth daily.    [provider]  OVER THE COUNTER MEDICATION Take 4 tablets by mouth daily. [place] Supplement Growth Factor    [provider]  Turmeric Curcumin 500 MG CAPS Take 500 mg by mouth 2 (two) times daily.    [provider]  vitamin B-12 (CYANOCOBALAMIN) 1000 MCG tablet Take 1,000 mcg by mouth daily.    [provider]  vitamin E 400 UNIT capsule Take 400 Units by mouth daily.    [provider]  Zinc 50 MG TABS Take 50 mg by mouth  daily.    [provider]    Allergies    Patient has no known allergies.  Review of Systems   Review of Systems  Cardiovascular: Positive for chest pain.  All other systems reviewed and are negative.   Physical Exam Updated Vital Signs BP 125/62   Pulse 66   Temp 98.3 F (36.8 C) (Oral)   Resp 14   Ht 5\' 7"  (1.702 m)   Wt 78.5 kg   SpO2 97%   BMI 27.10 kg/m   Physical Exam Vitals and nursing note reviewed.  HENT:     Head: Normocephalic.  Eyes:     Pupils: Pupils are equal, round, and reactive to light.  Cardiovascular:     Rate and Rhythm: Regular rhythm.     Heart sounds: Normal heart sounds.  Pulmonary:     Breath sounds: Normal breath sounds.  Abdominal:     General: Bowel sounds are normal.     Palpations: Abdomen is soft.  Musculoskeletal:        General: Normal range of motion.     Cervical back: Normal range of motion and neck supple.     Comments: L deltoid injection site with no redness or swelling   Skin:    General: Skin is warm.     Capillary Refill: Capillary refill takes less than 2 seconds.  Neurological:     General: No focal deficit present.     Mental Status: He is alert and oriented to person, place, and time.  Psychiatric:        Mood and Affect: Mood normal.        Behavior: Behavior normal.     ED Results / Procedures / Treatments   Labs (all labs ordered are listed, but only abnormal results are displayed) Labs Reviewed  BASIC METABOLIC PANEL - Abnormal; Notable for the following components:      Result Value   Sodium 130 (*)    Chloride 95 (*)    Glucose, Bld 120 (*)    All other components within normal limits  CBC - Abnormal; Notable for the following components:   WBC 11.1 (*)    All other components within normal limits  TROPONIN I (HIGH SENSITIVITY) - Abnormal; Notable for the following components:   Troponin I (High Sensitivity) 19 (*)    All other components within normal limits  TROPONIN I (HIGH  SENSITIVITY) - Abnormal; Notable for the following components:   Troponin I (High Sensitivity) 21 (*)    All other components within normal limits  TROPONIN I (HIGH SENSITIVITY) - Abnormal; Notable for the following components:   Troponin I (High Sensitivity) 22 (*)  All other components within normal limits    EKG EKG Interpretation  Date/Time:  Saturday April 26 2019 16:27:25 EST Ventricular Rate:  73 PR Interval:  156 QRS Duration: 114 QT Interval:  422 QTC Calculation: 464 R Axis:   79 Text Interpretation: Normal sinus rhythm Normal ECG When compared with ECG of 25-Mar-2019 08:12, PREVIOUS ECG IS PRESENT Confirmed by Aharon, [place] [id number]) on 04/26/2019 7:38:28 PM   Radiology DG Chest 2 View  Result Date: 04/26/2019 CLINICAL DATA:  Chest pain. Vaccine earlier today. Atrial fibrillation. EXAM: CHEST - 2 VIEW COMPARISON:  01/21/2019 FINDINGS: Lateral view degraded by patient arm position. Midline trachea. Normal heart size and mediastinal contours. No pleural effusion or pneumothorax. Clear lungs. IMPRESSION: No acute cardiopulmonary disease. Electronically Signed   By: Abigail Miyamoto M.D.   On: 04/26/2019 17:16    Procedures Procedures (including critical care time)  Medications Ordered in ED Medications  sodium chloride 0.9 % bolus 1,000 mL (0 mLs Intravenous Stopped 04/26/19 2256)    ED Course  I have reviewed the triage vital signs and the nursing notes.  Pertinent labs & imaging results that were available during my care of the patient were reviewed by me and considered in my medical decision making (see chart for details).    MDM Rules/Calculators/A&P                      FREAD ACKROYD is a 69 y.o. male who presented with chest pain.  Patient did have Covid shot earlier today. He likely had a side effect the vaccine.  Has no obvious local reaction or signs of anaphylaxis. Low risk for ACS so will get trop x 2.   11:10 PM Patient's troponin is stable around 22.   His sodium is 130 and was given some fluids.  Stable for discharge.  Likely side effect of Covid vaccine.  Final Clinical Impression(s) / ED Diagnoses Final diagnoses:  None    Rx / DC Orders ED Discharge Orders    None       Drenda Freeze, MD 04/26/19 2311

## 2019-04-26 NOTE — ED Notes (Signed)
Pt to desk inquiring about wait time. Pt informed that we are waiting on a room.

## 2019-04-26 NOTE — ED Notes (Signed)
Pt to front desk asking about something to drink and taking home medications. Pt informed to wait until seeing MD to take medications. Pt asked how long until he sees MD, informed him that we are waiting on a room.

## 2019-04-26 NOTE — ED Notes (Signed)
Attempted to call pt's son in law, went to full voicemail. Pt informed to please notify family "what is going on". Pt unable to reach family as well.

## 2019-04-26 NOTE — ED Notes (Signed)
First Nurse Note: Pt sent from urgent care for EKG. Pt states that he got his COVID shot today. Pt ambulatory into ED in NAD.

## 2019-05-02 DIAGNOSIS — R030 Elevated blood-pressure reading, without diagnosis of hypertension: Secondary | ICD-10-CM | POA: Diagnosis not present

## 2019-05-02 DIAGNOSIS — G2581 Restless legs syndrome: Secondary | ICD-10-CM | POA: Diagnosis not present

## 2019-05-02 DIAGNOSIS — R351 Nocturia: Secondary | ICD-10-CM | POA: Diagnosis not present

## 2019-05-08 DIAGNOSIS — L84 Corns and callosities: Secondary | ICD-10-CM | POA: Diagnosis not present

## 2019-05-08 DIAGNOSIS — L600 Ingrowing nail: Secondary | ICD-10-CM | POA: Diagnosis not present

## 2019-05-08 DIAGNOSIS — M2041 Other hammer toe(s) (acquired), right foot: Secondary | ICD-10-CM | POA: Diagnosis not present

## 2019-05-08 DIAGNOSIS — M2011 Hallux valgus (acquired), right foot: Secondary | ICD-10-CM | POA: Diagnosis not present

## 2019-05-08 DIAGNOSIS — M2042 Other hammer toe(s) (acquired), left foot: Secondary | ICD-10-CM | POA: Diagnosis not present

## 2019-05-08 DIAGNOSIS — M2012 Hallux valgus (acquired), left foot: Secondary | ICD-10-CM | POA: Diagnosis not present

## 2019-05-08 DIAGNOSIS — E119 Type 2 diabetes mellitus without complications: Secondary | ICD-10-CM | POA: Diagnosis not present

## 2019-05-08 DIAGNOSIS — M19172 Post-traumatic osteoarthritis, left ankle and foot: Secondary | ICD-10-CM | POA: Diagnosis not present

## 2019-05-08 DIAGNOSIS — Q667 Congenital pes cavus, unspecified foot: Secondary | ICD-10-CM | POA: Diagnosis not present

## 2019-05-08 DIAGNOSIS — B351 Tinea unguium: Secondary | ICD-10-CM | POA: Diagnosis not present

## 2019-05-08 DIAGNOSIS — L851 Acquired keratosis [keratoderma] palmaris et plantaris: Secondary | ICD-10-CM | POA: Diagnosis not present

## 2019-05-16 DIAGNOSIS — R0789 Other chest pain: Secondary | ICD-10-CM | POA: Diagnosis not present

## 2019-06-12 DIAGNOSIS — L851 Acquired keratosis [keratoderma] palmaris et plantaris: Secondary | ICD-10-CM | POA: Diagnosis not present

## 2019-06-12 DIAGNOSIS — E119 Type 2 diabetes mellitus without complications: Secondary | ICD-10-CM | POA: Diagnosis not present

## 2019-06-19 DIAGNOSIS — Z7901 Long term (current) use of anticoagulants: Secondary | ICD-10-CM | POA: Diagnosis not present

## 2019-06-19 DIAGNOSIS — K219 Gastro-esophageal reflux disease without esophagitis: Secondary | ICD-10-CM | POA: Diagnosis not present

## 2019-06-19 DIAGNOSIS — Z1211 Encounter for screening for malignant neoplasm of colon: Secondary | ICD-10-CM | POA: Diagnosis not present

## 2019-08-07 DIAGNOSIS — H35363 Drusen (degenerative) of macula, bilateral: Secondary | ICD-10-CM | POA: Diagnosis not present

## 2019-08-07 DIAGNOSIS — H524 Presbyopia: Secondary | ICD-10-CM | POA: Diagnosis not present

## 2019-08-07 DIAGNOSIS — H2513 Age-related nuclear cataract, bilateral: Secondary | ICD-10-CM | POA: Diagnosis not present

## 2019-08-15 DIAGNOSIS — E782 Mixed hyperlipidemia: Secondary | ICD-10-CM | POA: Diagnosis not present

## 2019-08-15 DIAGNOSIS — I4891 Unspecified atrial fibrillation: Secondary | ICD-10-CM | POA: Diagnosis not present

## 2019-08-15 DIAGNOSIS — R002 Palpitations: Secondary | ICD-10-CM | POA: Diagnosis not present

## 2019-08-15 DIAGNOSIS — Z8673 Personal history of transient ischemic attack (TIA), and cerebral infarction without residual deficits: Secondary | ICD-10-CM | POA: Diagnosis not present

## 2019-08-15 DIAGNOSIS — R600 Localized edema: Secondary | ICD-10-CM | POA: Diagnosis not present

## 2019-08-15 DIAGNOSIS — R0789 Other chest pain: Secondary | ICD-10-CM | POA: Diagnosis not present

## 2019-09-08 ENCOUNTER — Other Ambulatory Visit: Payer: Self-pay

## 2019-09-08 ENCOUNTER — Other Ambulatory Visit
Admission: RE | Admit: 2019-09-08 | Discharge: 2019-09-08 | Disposition: A | Discharge status: Home / Self Care | Payer: PPO | Source: Ambulatory Visit | Attending: General Surgery | Admitting: General Surgery

## 2019-09-08 DIAGNOSIS — Z20822 Contact with and (suspected) exposure to covid-19: Secondary | ICD-10-CM | POA: Insufficient documentation

## 2019-09-08 DIAGNOSIS — Z01812 Encounter for preprocedural laboratory examination: Secondary | ICD-10-CM | POA: Diagnosis not present

## 2019-09-08 LAB — SARS CORONAVIRUS 2 (TAT 6-24 HRS): SARS Coronavirus 2: NEGATIVE

## 2019-09-09 ENCOUNTER — Encounter: Payer: Self-pay | Admitting: General Surgery

## 2019-09-10 ENCOUNTER — Ambulatory Visit: Payer: PPO | Admitting: Certified Registered Nurse Anesthetist

## 2019-09-10 ENCOUNTER — Encounter
Admission: RE | Disposition: A | Discharge status: Home / Self Care | Payer: Self-pay | Source: Home / Self Care | Attending: General Surgery

## 2019-09-10 ENCOUNTER — Other Ambulatory Visit: Payer: Self-pay

## 2019-09-10 ENCOUNTER — Ambulatory Visit
Admission: RE | Admit: 2019-09-10 | Discharge: 2019-09-10 | Disposition: A | Discharge status: Home / Self Care | Payer: PPO | Attending: General Surgery | Admitting: General Surgery

## 2019-09-10 ENCOUNTER — Encounter: Payer: Self-pay | Admitting: General Surgery

## 2019-09-10 DIAGNOSIS — K635 Polyp of colon: Secondary | ICD-10-CM | POA: Diagnosis not present

## 2019-09-10 DIAGNOSIS — D123 Benign neoplasm of transverse colon: Secondary | ICD-10-CM | POA: Insufficient documentation

## 2019-09-10 DIAGNOSIS — Z7901 Long term (current) use of anticoagulants: Secondary | ICD-10-CM | POA: Insufficient documentation

## 2019-09-10 DIAGNOSIS — Z79899 Other long term (current) drug therapy: Secondary | ICD-10-CM | POA: Insufficient documentation

## 2019-09-10 DIAGNOSIS — Z1211 Encounter for screening for malignant neoplasm of colon: Secondary | ICD-10-CM | POA: Insufficient documentation

## 2019-09-10 DIAGNOSIS — E119 Type 2 diabetes mellitus without complications: Secondary | ICD-10-CM | POA: Diagnosis not present

## 2019-09-10 DIAGNOSIS — Z791 Long term (current) use of non-steroidal anti-inflammatories (NSAID): Secondary | ICD-10-CM | POA: Insufficient documentation

## 2019-09-10 DIAGNOSIS — I4891 Unspecified atrial fibrillation: Secondary | ICD-10-CM | POA: Diagnosis not present

## 2019-09-10 DIAGNOSIS — Z8673 Personal history of transient ischemic attack (TIA), and cerebral infarction without residual deficits: Secondary | ICD-10-CM | POA: Diagnosis not present

## 2019-09-10 HISTORY — DX: Type 2 diabetes mellitus without complications: E11.9

## 2019-09-10 HISTORY — DX: Restless legs syndrome: G25.81

## 2019-09-10 HISTORY — DX: Localized edema: R60.0

## 2019-09-10 HISTORY — DX: Anemia, unspecified: D64.9

## 2019-09-10 HISTORY — DX: Cardiac arrhythmia, unspecified: I49.9

## 2019-09-10 HISTORY — PX: COLONOSCOPY WITH PROPOFOL: SHX5780

## 2019-09-10 HISTORY — DX: Other amnesia: R41.3

## 2019-09-10 SURGERY — COLONOSCOPY WITH PROPOFOL
Anesthesia: General

## 2019-09-10 MED ORDER — MIDAZOLAM HCL 2 MG/2ML IJ SOLN
INTRAMUSCULAR | Status: AC
  Filled 2019-09-10: qty 2

## 2019-09-10 MED ORDER — APIXABAN 5 MG PO TABS: 5.0000 mg | ORAL_TABLET | Freq: Two times a day (BID) | ORAL | Status: DC

## 2019-09-10 MED ORDER — PROPOFOL 10 MG/ML IV BOLUS
INTRAVENOUS | Status: DC | PRN
  Administered 2019-09-10: 70 mg via INTRAVENOUS
  Administered 2019-09-10: 20 mg via INTRAVENOUS
  Administered 2019-09-10: 10 mg via INTRAVENOUS

## 2019-09-10 MED ORDER — MIDAZOLAM HCL 2 MG/2ML IJ SOLN
INTRAMUSCULAR | Status: DC | PRN
  Administered 2019-09-10: 2 mg via INTRAVENOUS

## 2019-09-10 MED ORDER — SODIUM CHLORIDE 0.9 % IV SOLN: INTRAVENOUS | Status: DC

## 2019-09-10 MED ORDER — PROPOFOL 500 MG/50ML IV EMUL
INTRAVENOUS | Status: DC | PRN
  Administered 2019-09-10: 160 ug/kg/min via INTRAVENOUS

## 2019-09-10 NOTE — H&P (Signed)
Timothy Wells [id number] 1950-06-21     HPI:  69 y/o male for screening colonoscopy. Tolerated prep well. He discontinued his eliquis 5 days ago.   Medications Prior to Admission  Medication Sig Dispense Refill Last Dose  . amiodarone (PACERONE) 400 MG tablet Take 1 tablet (400 mg total) by mouth 2 (two) times daily. (Patient taking differently: Take 400 mg by mouth daily. ) 60 tablet 1 09/10/2019 at 0600  . Ascorbic Acid (VITAMIN C WITH ROSE HIPS) 1000 MG tablet Take 1,000 mg by mouth daily.   Past Week at Unknown time  . atorvastatin (LIPITOR) 80 MG tablet Take 80 mg by mouth every evening.    09/09/2019 at Unknown time  . Cholecalciferol (VITAMIN D3 SUPER STRENGTH) 50 MCG (2000 UT) CAPS Take 2,000 Units by mouth daily.   Past Week at Unknown time  . Chromium-Cinnamon (CINNAMON PLUS CHROMIUM PO) Take 1 tablet by mouth 2 (two) times daily.   Past Week at Unknown time  . CRANBERRY EXTRACT PO Take 500 mg by mouth daily.   Past Week at Unknown time  . diltiazem (CARDIZEM CD) 240 MG 24 hr capsule Take 1 capsule (240 mg total) by mouth daily. 30 capsule 1 09/10/2019 at 0600  . Ginkgo Biloba 60 MG TABS Take 60 mg by mouth 2 (two) times daily.   Past Week at Unknown time  . Glucosamine-Chondroitin (COSAMIN DS PO) Take 1 tablet by mouth 2 (two) times daily.   Past Week at Unknown time  . lisinopril (ZESTRIL) 5 MG tablet Take 5 mg by mouth daily.     . magnesium oxide (MAG-OX) 400 (241.3 Mg) MG tablet Take 1 tablet (400 mg total) by mouth daily. 30 tablet 0 Past Week at Unknown time  . Multiple Vitamin (MULTIVITAMIN WITH MINERALS) TABS tablet Take 1 tablet by mouth daily.   Past Week at Unknown time  . Omega-3 Fatty Acids (FISH OIL) 1000 MG CAPS Take 1,000 mg by mouth daily.   Past Week at Unknown time  . omeprazole (PRILOSEC) 20 MG capsule Take 20 mg by mouth daily.   09/09/2019 at Unknown time  . rOPINIRole (REQUIP) 1 MG tablet Take 1 mg by mouth at bedtime.   09/09/2019 at Unknown time  . Turmeric  Curcumin 500 MG CAPS Take 500 mg by mouth 2 (two) times daily.   Past Week at Unknown time  . vitamin B-12 (CYANOCOBALAMIN) 1000 MCG tablet Take 1,000 mcg by mouth daily.   Past Week at Unknown time  . vitamin E 400 UNIT capsule Take 400 Units by mouth daily.   Past Week at Unknown time  . Zinc 50 MG TABS Take 50 mg by mouth daily.   Past Week at Unknown time  . apixaban (ELIQUIS) 5 MG TABS tablet Take 5 mg by mouth 2 (two) times daily.   09/05/2019  . furosemide (LASIX) 20 MG tablet Take 20 mg by mouth daily.      . meloxicam (MOBIC) 15 MG tablet Take 1 tablet (15 mg total) by mouth daily. (Patient not taking: Reported on 03/19/2019) 14 tablet 0   . OVER THE COUNTER MEDICATION Take 4 tablets by mouth daily. [place] Supplement Growth Factor      No Known Allergies Past Medical History:  Diagnosis Date  . A-fib ([place])   . Anemia   . Diabetes mellitus without complication ([place])    type 2  . Dysrhythmia    wide complex tachycardia  . Edema of both lower legs   .  Memory loss   . Restless leg syndrome   . Skin cancer    skin cancer face basal cell  . Stroke [facility])    Past Surgical History:  Procedure Laterality Date  . CARDIOVERSION N/A 03/25/2019   Procedure: CARDIOVERSION;  Surgeon: Yolonda Kida, MD;  Location: [facility] ORS;  Service: Cardiovascular;  Laterality: N/A;   Social History   Socioeconomic History  . Marital status: Single    Spouse name: Not on file  . Number of children: Not on file  . Years of education: Not on file  . Highest education level: Not on file  Occupational History  . Not on file  Tobacco Use  . Smoking status: Never Smoker  . Smokeless tobacco: Never Used  Substance and Sexual Activity  . Alcohol use: Yes    Comment: rare  . Drug use: Not Currently  . Sexual activity: Not on file  Other Topics Concern  . Not on file  Social History Narrative  . Not on file   Social Determinants of Health   Financial Resource Strain:   . Difficulty of Paying  Living Expenses:   Food Insecurity:   . Worried About Charity fundraiser in the Last Year:   . Arboriculturist in the Last Year:   Transportation Needs:   . Film/video editor (Medical):   Marland Kitchen Lack of Transportation (Non-Medical):   Physical Activity:   . Days of Exercise per Week:   . Minutes of Exercise per Session:   Stress:   . Feeling of Stress :   Social Connections:   . Frequency of Communication with Friends and Family:   . Frequency of Social Gatherings with Friends and Family:   . Attends Religious Services:   . Active Member of Clubs or Organizations:   . Attends Archivist Meetings:   Marland Kitchen Marital Status:   Intimate Partner Violence:   . Fear of Current or Ex-Partner:   . Emotionally Abused:   Marland Kitchen Physically Abused:   . Sexually Abused:    Social History   Social History Narrative  . Not on file     ROS: Negative.     PE: HEENT: Negative. Lungs: Clear. Cardio: RR.  Assessment/Plan:  Proceed with planned endoscopy.   Forest Gleason [facility] 09/10/2019

## 2019-09-10 NOTE — Anesthesia Preprocedure Evaluation (Signed)
Anesthesia Evaluation  Patient identified by MRN, date of birth, ID band Patient awake    Reviewed: Allergy & Precautions, NPO status , Patient's Chart, lab work & pertinent test results  History of Anesthesia Complications Negative for: history of anesthetic complications  Airway Mallampati: II       Dental   Pulmonary neg sleep apnea, neg COPD, Not current smoker,           Cardiovascular (-) hypertension(-) Past MI and (-) CHF + dysrhythmias (cardioversion 1/21) Atrial Fibrillation (-) Valvular Problems/Murmurs     Neuro/Psych neg Seizures CVA (speech difficulties), Residual Symptoms    GI/Hepatic Neg liver ROS, GERD  Medicated and Controlled,  Endo/Other  diabetes, Type 2, Oral Hypoglycemic Agents  Renal/GU negative Renal ROS     Musculoskeletal   Abdominal   Peds  Hematology  (+) anemia ,   Anesthesia Other Findings   Reproductive/Obstetrics                             Anesthesia Physical Anesthesia Plan  ASA: III  Anesthesia Plan: General   Post-op Pain Management:    Induction: Intravenous  PONV Risk Score and Plan: 2 and Propofol infusion and TIVA  Airway Management Planned: Nasal Cannula  Additional Equipment:   Intra-op Plan:   Post-operative Plan:   Informed Consent: I have reviewed the patients History and Physical, chart, labs and discussed the procedure including the risks, benefits and alternatives for the proposed anesthesia with the patient or authorized representative who has indicated his/her understanding and acceptance.       Plan Discussed with:   Anesthesia Plan Comments:         Anesthesia Quick Evaluation

## 2019-09-10 NOTE — Transfer of Care (Signed)
Immediate Anesthesia Transfer of Care Note  Patient: Kalieb Freeland Isbell  Procedure(s) Performed: COLONOSCOPY WITH PROPOFOL (N/A )  Patient Location: PACU  Anesthesia Type:General  Level of Consciousness: awake and alert   Airway & Oxygen Therapy: Patient Spontanous Breathing and Patient connected to nasal cannula oxygen  Post-op Assessment: Report given to RN and Post -op Vital signs reviewed and stable  Post vital signs: Reviewed and stable  Last Vitals:  Vitals Value Taken Time  BP 89/58 09/10/19 1144  Temp    Pulse 59 09/10/19 1144  Resp 13 09/10/19 1144  SpO2 96 % 09/10/19 1144  Vitals shown include unvalidated device data.  Last Pain:  Vitals:   09/10/19 1142  TempSrc:   PainSc: Asleep         Complications: No complications documented.

## 2019-09-10 NOTE — Op Note (Signed)
[facility] Gastroenterology Patient Name: Timothy Wells Procedure Date: 09/10/2019 10:40 AM MRN: [id number] Account #: [id number] Date of Birth: February 29, 1950 Admit Type: Outpatient Age: 69 Room: [facility] ENDO ROOM 1 Gender: Male Note Status: Finalized Procedure:             Colonoscopy Indications:           Screening for colorectal malignant neoplasm Providers:             Robert Bellow, MD Medicines:             Monitored Anesthesia Care Complications:         No immediate complications. Procedure:             Pre-Anesthesia Assessment:                        - Prior to the procedure, a History and Physical was                         performed, and patient medications, allergies and                         sensitivities were reviewed. The patient's tolerance                         of previous anesthesia was reviewed.                        - The risks and benefits of the procedure and the                         sedation options and risks were discussed with the                         patient. All questions were answered and informed                         consent was obtained.                        After obtaining informed consent, the colonoscope was                         passed under direct vision. Throughout the procedure,                         the patient's blood pressure, pulse, and oxygen                         saturations were monitored continuously. The                         Colonoscope was introduced through the anus and                         advanced to the the cecum, identified by appendiceal                         orifice and ileocecal valve. The colonoscopy was  performed without difficulty. The patient tolerated                         the procedure well. The quality of the bowel                         preparation was excellent. Findings:      Two sessile polyps were found in the transverse colon and mid  transverse       colon. The polyps were 5 mm in size. These polyps were removed with a       hot snare. Resection and retrieval were complete.      The retroflexed view of the distal rectum and anal verge was normal and       showed no anal or rectal abnormalities. Impression:            - Two 5 mm polyps in the transverse colon and in the                         mid transverse colon, removed with a hot snare.                         Resected and retrieved.                        - The distal rectum and anal verge are normal on                         retroflexion view. Recommendation:        - Telephone endoscopist for pathology results in 1                         week. Procedure Code(s):     --- Professional ---                        [phone], Colonoscopy, flexible; with removal of                         tumor(s), polyp(s), or other lesion(s) by snare                         technique Diagnosis Code(s):     --- Professional ---                        Z12.11, Encounter for screening for malignant neoplasm                         of colon                        K63.5, Polyp of colon CPT copyright 2019 American Medical Association. All rights reserved. The codes documented in this report are preliminary and upon coder review may  be revised to meet current compliance requirements. Robert Bellow, MD 09/10/2019 11:38:43 AM This report has been signed electronically. Number of Addenda: 0 Note Initiated On: 09/10/2019 10:40 AM Scope Withdrawal Time: 0 hours 27 minutes 6 seconds  Total Procedure Duration: 0 hours 33 minutes 14 seconds  Estimated Blood Loss:  Estimated blood loss: none.  [facility]

## 2019-09-10 NOTE — Anesthesia Postprocedure Evaluation (Signed)
Anesthesia Post Note  Patient: Timothy Wells  Procedure(s) Performed: COLONOSCOPY WITH PROPOFOL (N/A )  Patient location during evaluation: Endoscopy Anesthesia Type: General Level of consciousness: awake and alert Pain management: pain level controlled Vital Signs Assessment: post-procedure vital signs reviewed and stable Respiratory status: spontaneous breathing and respiratory function stable Cardiovascular status: stable Anesthetic complications: no   No complications documented.   Last Vitals:  Vitals:   09/10/19 1154 09/10/19 1204  BP: (!) 89/53 (!) 99/57  Pulse:    Resp: 12   Temp:    SpO2: 97% 99%    Last Pain:  Vitals:   09/10/19 1204  TempSrc:   PainSc: 0-No pain                 Labron Bloodgood K

## 2019-09-11 ENCOUNTER — Encounter: Payer: Self-pay | Admitting: General Surgery

## 2019-09-11 LAB — SURGICAL PATHOLOGY

## 2019-10-10 DIAGNOSIS — Z0000 Encounter for general adult medical examination without abnormal findings: Secondary | ICD-10-CM | POA: Diagnosis not present

## 2019-10-10 DIAGNOSIS — N400 Enlarged prostate without lower urinary tract symptoms: Secondary | ICD-10-CM | POA: Diagnosis not present

## 2019-10-10 DIAGNOSIS — R351 Nocturia: Secondary | ICD-10-CM | POA: Diagnosis not present

## 2019-10-10 DIAGNOSIS — I480 Paroxysmal atrial fibrillation: Secondary | ICD-10-CM | POA: Diagnosis not present

## 2019-10-10 DIAGNOSIS — G2581 Restless legs syndrome: Secondary | ICD-10-CM | POA: Diagnosis not present

## 2019-10-10 DIAGNOSIS — E559 Vitamin D deficiency, unspecified: Secondary | ICD-10-CM | POA: Diagnosis not present

## 2019-10-10 DIAGNOSIS — E7849 Other hyperlipidemia: Secondary | ICD-10-CM | POA: Diagnosis not present

## 2019-10-10 DIAGNOSIS — R030 Elevated blood-pressure reading, without diagnosis of hypertension: Secondary | ICD-10-CM | POA: Diagnosis not present

## 2019-10-10 DIAGNOSIS — E119 Type 2 diabetes mellitus without complications: Secondary | ICD-10-CM | POA: Diagnosis not present

## 2019-11-07 ENCOUNTER — Ambulatory Visit
Admission: EM | Admit: 2019-11-07 | Discharge: 2019-11-07 | Disposition: A | Discharge status: Home / Self Care | Payer: PPO | Attending: Family Medicine | Admitting: Family Medicine

## 2019-11-07 ENCOUNTER — Other Ambulatory Visit: Payer: Self-pay

## 2019-11-07 ENCOUNTER — Encounter: Payer: Self-pay | Admitting: Emergency Medicine

## 2019-11-07 ENCOUNTER — Ambulatory Visit (INDEPENDENT_AMBULATORY_CARE_PROVIDER_SITE_OTHER): Payer: PPO

## 2019-11-07 DIAGNOSIS — M19012 Primary osteoarthritis, left shoulder: Secondary | ICD-10-CM | POA: Diagnosis not present

## 2019-11-07 DIAGNOSIS — M25512 Pain in left shoulder: Secondary | ICD-10-CM

## 2019-11-07 NOTE — Discharge Instructions (Addendum)
You were seen at the Urgent Care for shoulder pain. Please pick up your prescriptions at your pharmacy. Take Tylenol as need for pain (1000 mg (2 - 500 mg tablets) up to three times a day. Follow up with your PCP as needed. For concern for rotator cuff tear. You should see an orthopedic surgeon, Dr. Posey Pronto.   If you haven't already, sign up for My Chart to have easy access to your labs results, and communication with your primary care physician.  Dr. Susa Simmonds

## 2019-11-07 NOTE — ED Triage Notes (Signed)
Pt c/o left shoulder pain. Started about 5 days ago. No known injury. He states he can not lift his shoulder. He has tried ice and has helped.

## 2019-11-07 NOTE — ED Provider Notes (Signed)
[facility]    CSN: [id number] Arrival date & time: 11/07/19  1916      History   Chief Complaint Chief Complaint  Patient presents with  . Shoulder Pain    right    HPI Timothy Wells is a 69 y.o. male.   HPI   Patient reports on Monday he was lifting something higher into a truck at work. Denies trauma and fall. Took nothing for pain. States he heard loud pop and felt sharp pain. He has not been moving his shoulder due to pain. Worse with movement and better with rest.  Patient takes Eliquis for AFIB.   Past Medical History:  Diagnosis Date  . A-fib ([place])   . Anemia   . Diabetes mellitus without complication ([place])    type 2  . Dysrhythmia    wide complex tachycardia  . Edema of both lower legs   . Memory loss   . Restless leg syndrome   . Skin cancer    skin cancer face basal cell  . Stroke [facility])     Patient Active Problem List   Diagnosis Date Noted  . Dizziness   . Wide-complex tachycardia ([place]) 01/21/2019  . Hypotension 01/21/2019  . History of CVA (cerebrovascular accident) 01/21/2019    Past Surgical History:  Procedure Laterality Date  . CARDIOVERSION N/A 03/25/2019   Procedure: CARDIOVERSION;  Surgeon: Yolonda Kida, MD;  Location: [facility] ORS;  Service: Cardiovascular;  Laterality: N/A;  . COLONOSCOPY WITH PROPOFOL N/A 09/10/2019   Procedure: COLONOSCOPY WITH PROPOFOL;  Surgeon: Robert Bellow, MD;  Location: [facility] ENDOSCOPY;  Service: Endoscopy;  Laterality: N/A;       Home Medications    Prior to Admission medications   Medication Sig Start Date End Date Taking? Authorizing Provider  amiodarone (PACERONE) 400 MG tablet Take 1 tablet (400 mg total) by mouth 2 (two) times daily. Patient taking differently: Take 400 mg by mouth daily.  01/22/19  Yes Fritzi Mandes, MD  apixaban (ELIQUIS) 5 MG TABS tablet Take 1 tablet (5 mg total) by mouth 2 (two) times daily. 09/12/19  Yes Byrnett, Forest Gleason, MD  atorvastatin (LIPITOR) 80 MG  tablet Take 80 mg by mouth every evening.  07/02/17  Yes [provider]  Cholecalciferol (VITAMIN D3 SUPER STRENGTH) 50 MCG (2000 UT) CAPS Take 2,000 Units by mouth daily.   Yes [provider]  Chromium-Cinnamon (CINNAMON PLUS CHROMIUM PO) Take 1 tablet by mouth 2 (two) times daily.   Yes [provider]  CRANBERRY EXTRACT PO Take 500 mg by mouth daily.   Yes [provider]  diltiazem (CARDIZEM CD) 240 MG 24 hr capsule Take 1 capsule (240 mg total) by mouth daily. 01/23/19  Yes Fritzi Mandes, MD  Glucosamine-Chondroitin (COSAMIN DS PO) Take 1 tablet by mouth 2 (two) times daily.   Yes [provider]  lisinopril (ZESTRIL) 5 MG tablet Take 5 mg by mouth daily.   Yes [provider]  magnesium oxide (MAG-OX) 400 (241.3 Mg) MG tablet Take 1 tablet (400 mg total) by mouth daily. 01/23/19  Yes Fritzi Mandes, MD  meloxicam (MOBIC) 15 MG tablet Take 1 tablet (15 mg total) by mouth daily. 05/24/17  Yes Coral Spikes, DO  Multiple Vitamin (MULTIVITAMIN WITH MINERALS) TABS tablet Take 1 tablet by mouth daily.   Yes [provider]  Omega-3 Fatty Acids (FISH OIL) 1000 MG CAPS Take 1,000 mg by mouth daily.   Yes [provider]  omeprazole (  PRILOSEC) 20 MG capsule Take 20 mg by mouth daily.   Yes [provider]  OVER THE COUNTER MEDICATION Take 4 tablets by mouth daily. [place] Supplement Growth Factor   Yes [provider]  rOPINIRole (REQUIP) 1 MG tablet Take 1 mg by mouth at bedtime.   Yes [provider]  Turmeric Curcumin 500 MG CAPS Take 500 mg by mouth 2 (two) times daily.   Yes [provider]  vitamin B-12 (CYANOCOBALAMIN) 1000 MCG tablet Take 1,000 mcg by mouth daily.   Yes [provider]  vitamin E 400 UNIT capsule Take 400 Units by mouth daily.   Yes [provider]  Zinc 50 MG TABS Take 50 mg by mouth daily.   Yes [provider]  Ascorbic Acid (VITAMIN C WITH ROSE HIPS)  1000 MG tablet Take 1,000 mg by mouth daily.    [provider]  furosemide (LASIX) 20 MG tablet Take 20 mg by mouth daily.  01/30/19   [provider]  Ginkgo Biloba 60 MG TABS Take 60 mg by mouth 2 (two) times daily.    [provider]  tamsulosin (FLOMAX) 0.4 MG CAPS capsule Take 0.4 mg by mouth daily. 10/15/19   [provider]    Family History History reviewed. No pertinent family history.  Social History Social History   Tobacco Use  . Smoking status: Never Smoker  . Smokeless tobacco: Never Used  Vaping Use  . Vaping Use: Never used  Substance Use Topics  . Alcohol use: Yes    Comment: rare  . Drug use: Not Currently     Allergies   Patient has no known allergies.   Review of Systems Review of Systems:See HPI    Physical Exam Triage Vital Signs ED Triage Vitals [11/07/19 2017]  Enc Vitals Group     BP      Pulse      Resp      Temp      Temp src      SpO2      Weight 164 lb 0.4 oz (74.4 kg)     Height 5\' 7"  (1.702 m)     Head Circumference      Peak Flow      Pain Score 8     Pain Loc      Pain Edu?      Excl. in [place]?    No data found.  Updated Vital Signs BP 119/70 (BP Location: Right Arm)   Pulse 72   Temp 98.9 F (37.2 C) (Oral)   Resp 18   Ht 5\' 7"  (1.702 m)   Wt 164 lb 0.4 oz (74.4 kg)   SpO2 100%   BMI 25.69 kg/m   Visual Acuity Right Eye Distance:   Left Eye Distance:   Bilateral Distance:    Right Eye Near:   Left Eye Near:    Bilateral Near:     Physical Exam Vitals and nursing note reviewed.  Constitutional:      Appearance: Normal appearance.     Comments: Pleasant elderly male  HENT:     Head: Normocephalic and atraumatic.     Nose: Nose normal.  Eyes:     Extraocular Movements: Extraocular movements intact.     Conjunctiva/sclera: Conjunctivae normal.  Cardiovascular:     Rate and Rhythm: Normal rate.     Pulses: Normal pulses.  Pulmonary:     Effort: Pulmonary effort is  normal. No respiratory distress.  Musculoskeletal:  Cervical back: Normal range of motion.     Comments: Shoulder, left: No evidence of bony deformity, asymmetry, or muscle atrophy; No tenderness over long head of biceps (bicipital groove). No TTP at [facility] joint. Decreased active and passive range of motion suspect supraspinatus, infraspinatus and teres minor tear Sensation intact. Peripheral pulses intact.   Skin:    General: Skin is warm and dry.     Capillary Refill: Capillary refill takes less than 2 seconds.  Neurological:     Mental Status: He is alert and oriented to person, place, and time. Mental status is at baseline.  Psychiatric:        Mood and Affect: Mood normal.        Behavior: Behavior normal.      UC Treatments / Results  Labs (all labs ordered are listed, but only abnormal results are displayed) Labs Reviewed - No data to display  EKG   Radiology DG Shoulder Left  Result Date: 11/07/2019 CLINICAL DATA:  Shoulder pain. EXAM: LEFT SHOULDER - 2+ VIEW COMPARISON:  None. FINDINGS: There is mild shoulder osteoarthritis. There is no acute displaced fracture or dislocation. IMPRESSION: No acute displaced fracture or dislocation. Mild shoulder osteoarthritis. Electronically Signed   By: Constance Holster M.D.   On: 11/07/2019 20:50    Procedures Procedures (including critical care time)  Medications Ordered in UC Medications - No data to display  Initial Impression / Assessment and Plan / UC Course  I have reviewed the triage vital signs and the nursing notes.  Pertinent labs & imaging results that were available during my care of the patient were reviewed by me and considered in my medical decision making (see chart for details).     Patient with 5 day hx of shoulder pain. Reports hearing a pop. No fx or dislocation identified on xray. Advised patient to use Tylenol for pain. Avoid NSAIDs as pt takes Eliquis. Neurovascularly intact. Exam concerning for roator  cuff tear. Likely will need MRI and ortho surgery evaluation for suspected rotator cuff tear. Patient aware of findings and agree with plan.    Final Clinical Impressions(s) / UC Diagnoses   Final diagnoses:  Pain in joint of left shoulder     Discharge Instructions     You were seen at the Urgent Care for shoulder pain. Please pick up your prescriptions at your pharmacy. Take Tylenol as need for pain (1000 mg (2 - 500 mg tablets) up to three times a day. Follow up with your PCP as needed. For concern for rotator cuff tear. You should see an orthopedic surgeon, Dr. Posey Pronto.   If you haven't already, sign up for My Chart to have easy access to your labs results, and communication with your primary care physician.  Dr. Susa Simmonds      ED Prescriptions    None     PDMP not reviewed this encounter.   Lyndee Hensen, DO 11/07/19 2140

## 2019-11-13 ENCOUNTER — Other Ambulatory Visit (HOSPITAL_COMMUNITY): Payer: Self-pay | Admitting: Orthopedic Surgery

## 2019-11-13 ENCOUNTER — Other Ambulatory Visit: Payer: Self-pay | Admitting: Orthopedic Surgery

## 2019-11-13 DIAGNOSIS — M25512 Pain in left shoulder: Secondary | ICD-10-CM | POA: Diagnosis not present

## 2019-11-21 ENCOUNTER — Ambulatory Visit
Admission: RE | Admit: 2019-11-21 | Discharge: 2019-11-21 | Disposition: A | Discharge status: Home / Self Care | Payer: PPO | Source: Ambulatory Visit | Attending: Orthopedic Surgery | Admitting: Orthopedic Surgery

## 2019-11-21 ENCOUNTER — Other Ambulatory Visit: Payer: Self-pay

## 2019-11-21 DIAGNOSIS — S46012A Strain of muscle(s) and tendon(s) of the rotator cuff of left shoulder, initial encounter: Secondary | ICD-10-CM | POA: Diagnosis not present

## 2019-11-21 DIAGNOSIS — M25512 Pain in left shoulder: Secondary | ICD-10-CM

## 2019-11-25 DIAGNOSIS — M25512 Pain in left shoulder: Secondary | ICD-10-CM | POA: Diagnosis not present

## 2019-12-03 DIAGNOSIS — M25512 Pain in left shoulder: Secondary | ICD-10-CM | POA: Diagnosis not present

## 2019-12-03 DIAGNOSIS — M6281 Muscle weakness (generalized): Secondary | ICD-10-CM | POA: Diagnosis not present

## 2019-12-09 DIAGNOSIS — M6281 Muscle weakness (generalized): Secondary | ICD-10-CM | POA: Diagnosis not present

## 2019-12-09 DIAGNOSIS — M25512 Pain in left shoulder: Secondary | ICD-10-CM | POA: Diagnosis not present

## 2019-12-12 DIAGNOSIS — M6281 Muscle weakness (generalized): Secondary | ICD-10-CM | POA: Diagnosis not present

## 2019-12-12 DIAGNOSIS — M25512 Pain in left shoulder: Secondary | ICD-10-CM | POA: Diagnosis not present

## 2019-12-16 DIAGNOSIS — M25512 Pain in left shoulder: Secondary | ICD-10-CM | POA: Diagnosis not present

## 2019-12-16 DIAGNOSIS — M6281 Muscle weakness (generalized): Secondary | ICD-10-CM | POA: Diagnosis not present

## 2019-12-18 DIAGNOSIS — M25512 Pain in left shoulder: Secondary | ICD-10-CM | POA: Diagnosis not present

## 2019-12-18 DIAGNOSIS — M6281 Muscle weakness (generalized): Secondary | ICD-10-CM | POA: Diagnosis not present

## 2019-12-22 DIAGNOSIS — M25512 Pain in left shoulder: Secondary | ICD-10-CM | POA: Diagnosis not present

## 2019-12-22 DIAGNOSIS — M6281 Muscle weakness (generalized): Secondary | ICD-10-CM | POA: Diagnosis not present

## 2019-12-24 DIAGNOSIS — M25512 Pain in left shoulder: Secondary | ICD-10-CM | POA: Diagnosis not present

## 2019-12-24 DIAGNOSIS — M6281 Muscle weakness (generalized): Secondary | ICD-10-CM | POA: Diagnosis not present

## 2019-12-30 DIAGNOSIS — M6281 Muscle weakness (generalized): Secondary | ICD-10-CM | POA: Diagnosis not present

## 2019-12-30 DIAGNOSIS — M25512 Pain in left shoulder: Secondary | ICD-10-CM | POA: Diagnosis not present

## 2020-01-01 DIAGNOSIS — M6281 Muscle weakness (generalized): Secondary | ICD-10-CM | POA: Diagnosis not present

## 2020-01-01 DIAGNOSIS — M25512 Pain in left shoulder: Secondary | ICD-10-CM | POA: Diagnosis not present

## 2020-01-05 DIAGNOSIS — M25512 Pain in left shoulder: Secondary | ICD-10-CM | POA: Diagnosis not present

## 2020-01-05 DIAGNOSIS — M6281 Muscle weakness (generalized): Secondary | ICD-10-CM | POA: Diagnosis not present

## 2020-01-06 DIAGNOSIS — M25512 Pain in left shoulder: Secondary | ICD-10-CM | POA: Diagnosis not present

## 2020-01-07 DIAGNOSIS — M25512 Pain in left shoulder: Secondary | ICD-10-CM | POA: Diagnosis not present

## 2020-01-07 DIAGNOSIS — M6281 Muscle weakness (generalized): Secondary | ICD-10-CM | POA: Diagnosis not present

## 2020-01-13 DIAGNOSIS — M25512 Pain in left shoulder: Secondary | ICD-10-CM | POA: Diagnosis not present

## 2020-01-13 DIAGNOSIS — M6281 Muscle weakness (generalized): Secondary | ICD-10-CM | POA: Diagnosis not present

## 2020-01-15 DIAGNOSIS — M25512 Pain in left shoulder: Secondary | ICD-10-CM | POA: Diagnosis not present

## 2020-01-15 DIAGNOSIS — M6281 Muscle weakness (generalized): Secondary | ICD-10-CM | POA: Diagnosis not present

## 2020-01-21 DIAGNOSIS — M6281 Muscle weakness (generalized): Secondary | ICD-10-CM | POA: Diagnosis not present

## 2020-01-21 DIAGNOSIS — M25512 Pain in left shoulder: Secondary | ICD-10-CM | POA: Diagnosis not present

## 2020-01-27 DIAGNOSIS — M6281 Muscle weakness (generalized): Secondary | ICD-10-CM | POA: Diagnosis not present

## 2020-01-27 DIAGNOSIS — M25512 Pain in left shoulder: Secondary | ICD-10-CM | POA: Diagnosis not present

## 2020-01-29 DIAGNOSIS — M25512 Pain in left shoulder: Secondary | ICD-10-CM | POA: Diagnosis not present

## 2020-01-29 DIAGNOSIS — M6281 Muscle weakness (generalized): Secondary | ICD-10-CM | POA: Diagnosis not present

## 2020-02-03 DIAGNOSIS — M6281 Muscle weakness (generalized): Secondary | ICD-10-CM | POA: Diagnosis not present

## 2020-02-03 DIAGNOSIS — M25512 Pain in left shoulder: Secondary | ICD-10-CM | POA: Diagnosis not present

## 2020-02-05 DIAGNOSIS — M25512 Pain in left shoulder: Secondary | ICD-10-CM | POA: Diagnosis not present

## 2020-02-05 DIAGNOSIS — M6281 Muscle weakness (generalized): Secondary | ICD-10-CM | POA: Diagnosis not present

## 2020-02-06 DIAGNOSIS — I480 Paroxysmal atrial fibrillation: Secondary | ICD-10-CM | POA: Diagnosis not present

## 2020-02-10 DIAGNOSIS — M75122 Complete rotator cuff tear or rupture of left shoulder, not specified as traumatic: Secondary | ICD-10-CM | POA: Diagnosis not present

## 2020-02-10 DIAGNOSIS — M25512 Pain in left shoulder: Secondary | ICD-10-CM | POA: Diagnosis not present

## 2020-02-17 DIAGNOSIS — D229 Melanocytic nevi, unspecified: Secondary | ICD-10-CM | POA: Diagnosis not present

## 2020-02-17 DIAGNOSIS — L570 Actinic keratosis: Secondary | ICD-10-CM | POA: Diagnosis not present

## 2020-02-17 DIAGNOSIS — Z85828 Personal history of other malignant neoplasm of skin: Secondary | ICD-10-CM | POA: Diagnosis not present

## 2020-02-23 ENCOUNTER — Other Ambulatory Visit: Payer: Self-pay | Admitting: Orthopedic Surgery

## 2020-03-02 ENCOUNTER — Other Ambulatory Visit
Admission: RE | Admit: 2020-03-02 | Discharge: 2020-03-02 | Disposition: A | Discharge status: Home / Self Care | Payer: PPO | Source: Ambulatory Visit | Attending: Orthopedic Surgery | Admitting: Orthopedic Surgery

## 2020-03-02 NOTE — Patient Instructions (Addendum)
Your procedure is scheduled on: 03/08/20- Monday Report to the Registration Desk on the 1st floor of the [facility]. To find out your arrival time, please call [phone] between 1PM - 3PM on: 03/05/20- Friday  REMEMBER: Instructions that are not followed completely may result in serious medical risk, up to and including death; or upon the discretion of your surgeon and anesthesiologist your surgery may need to be rescheduled.  Do not eat food after midnight the night before surgery.  No gum chewing, lozengers or hard candies.  You may however, drink CLEAR liquids up to 2 hours before you are scheduled to arrive for your surgery. Do not drink anything within 2 hours of your scheduled arrival time.  Clear liquids include: - water   Type 1 and Type 2 diabetics should only drink water.  TAKE THESE MEDICATIONS THE MORNING OF SURGERY WITH A SIP OF WATER: - amiodarone (PACERONE) 400 MG tablet - diltiazem (CARDIZEM CD) 240 MG 24 hr capsule - metoprolol tartrate (LOPRESSOR) 25 MG tablet - omeprazole (PRILOSEC) 20 MG capsul, take one the night before and one on the morning of surgery - helps to prevent nausea after surgery.   Follow recommendations from Cardiologist, Pulmonologist or PCP regarding stopping Aspirin, Coumadin, Plavix, Eliquis, Pradaxa, or Pletal. Do Not Take Eliquis on 01/08, 01/09, and do not take the morning of surgery.  One week prior to surgery: Beginning 03/02/20, Stop Anti-inflammatories (NSAIDS) such as Advil, Aleve, Ibuprofen, Motrin, Naproxen, Naprosyn and Aspirin based products such as Excedrin, Goodys Powder, BC Powder.   Stop ANY OVER THE COUNTER supplements  Beginning 03/02/20 until after surgery. Ascorbic Acid (VITAMIN C WITH ROSE HIPS) 1000 MG tablet, Chromium-Cinnamon (CINNAMON PLUS CHROMIUM PO), CRANBERRY EXTRACT PO, Ginkgo Biloba 60 MG TABS, Glucosamine-Chondroitin (COSAMIN DS PO), Omega-3 Fatty Acids (FISH OIL) 1000 MG CAPS, Turmeric Curcumin 500 MG CAPS,  Vitamin E 400 UNIT capsule, Zinc 50 MG TABS, magnesium oxide (MAG-OX) 400 (241.3 Mg) MG tablet, " SuperBeats". (However, you may continue taking Vitamin D, Vitamin B, and multivitamin up until the day before surgery.)  No Alcohol for 24 hours before or after surgery.  No Smoking including e-cigarettes for 24 hours prior to surgery.  No chewable tobacco products for at least 6 hours prior to surgery.  No nicotine patches on the day of surgery.  Do not use any "recreational" drugs for at least a week prior to your surgery.  Please be advised that the combination of cocaine and anesthesia may have negative outcomes, up to and including death. If you test positive for cocaine, your surgery will be cancelled.  On the morning of surgery brush your teeth with toothpaste and water, you may rinse your mouth with mouthwash if you wish. Do not swallow any toothpaste or mouthwash.  Do not wear jewelry, make-up, hairpins, clips or nail polish.  Do not wear lotions, powders, or perfumes.   Do not shave body from the neck down 48 hours prior to surgery just in case you cut yourself which could leave a site for infection.  Also, freshly shaved skin may become irritated if using the CHG soap.  Contact lenses, hearing aids and dentures may not be worn into surgery.  Do not bring valuables to the hospital. [facility] is not responsible for any missing/lost belongings or valuables.   Use CHG Soap or wipes as directed on instruction sheet.   Notify your doctor if there is any change in your medical condition (cold, fever, infection).  Wear comfortable clothing (  specific to your surgery type) to the hospital.  Plan for stool softeners for home use; pain medications have a tendency to cause constipation. You can also help prevent constipation by eating foods high in fiber such as fruits and vegetables and drinking plenty of fluids as your diet allows.  After surgery, you can help prevent lung  complications by doing breathing exercises.  Take deep breaths and cough every 1-2 hours. Your doctor may order a device called an Incentive Spirometer to help you take deep breaths. When coughing or sneezing, hold a pillow firmly against your incision with both hands. This is called "splinting." Doing this helps protect your incision. It also decreases belly discomfort.  If you are being admitted to the hospital overnight, leave your suitcase in the car. After surgery it may be brought to your room.  If you are being discharged the day of surgery, you will not be allowed to drive home. You will need a responsible adult (18 years or older) to drive you home and stay with you that night.   If you are taking public transportation, you will need to have a responsible adult (18 years or older) with you. Please confirm with your physician that it is acceptable to use public transportation.   Please call the Pre-admissions Testing Dept. at [phone] if you have any questions about these instructions.  Visitation Policy:  Patients undergoing a surgery or procedure may have one family member or support person with them as long as that person is not COVID-19 positive or experiencing its symptoms.  That person may remain in the waiting area during the procedure.  Inpatient Visitation:    Visiting hours are 7 a.m. to 8 p.m. Patients will be allowed one visitor. The visitor may change daily. The visitor must pass COVID-19 screenings, use hand sanitizer when entering and exiting the patient's room and wear a mask at all times, including in the patient's room. Patients must also wear a mask when staff or their visitor are in the room. Masking is required regardless of vaccination status. Systemwide, no visitors 17 or younger.

## 2020-03-04 ENCOUNTER — Other Ambulatory Visit
Admission: RE | Admit: 2020-03-04 | Discharge: 2020-03-04 | Disposition: A | Discharge status: Home / Self Care | Payer: PPO | Source: Ambulatory Visit | Attending: Orthopedic Surgery | Admitting: Orthopedic Surgery

## 2020-03-04 ENCOUNTER — Other Ambulatory Visit: Payer: Self-pay

## 2020-03-04 ENCOUNTER — Encounter: Payer: Self-pay | Admitting: Orthopedic Surgery

## 2020-03-04 DIAGNOSIS — Z20822 Contact with and (suspected) exposure to covid-19: Secondary | ICD-10-CM | POA: Diagnosis not present

## 2020-03-04 DIAGNOSIS — Z01812 Encounter for preprocedural laboratory examination: Secondary | ICD-10-CM | POA: Diagnosis not present

## 2020-03-04 LAB — BASIC METABOLIC PANEL
Anion gap: 9 (ref 5–15)
BUN: 24 mg/dL — ABNORMAL HIGH (ref 8–23)
CO2: 26 mmol/L (ref 22–32)
Calcium: 9 mg/dL (ref 8.9–10.3)
Chloride: 103 mmol/L (ref 98–111)
Creatinine, Ser: 0.86 mg/dL (ref 0.61–1.24)
GFR, Estimated: >60 mL/min (ref >60)
Glucose, Bld: 84 mg/dL (ref 70–99)
Potassium: 4.5 mmol/L (ref 3.5–5.1)
Sodium: 138 mmol/L (ref 135–145)

## 2020-03-04 LAB — CBC
HCT: 36.6 % — ABNORMAL LOW (ref 39.0–52.0)
Hemoglobin: 12.4 g/dL — ABNORMAL LOW (ref 13.0–17.0)
MCH: 32.1 pg (ref 26.0–34.0)
MCHC: 33.9 g/dL (ref 30.0–36.0)
MCV: 94.8 fL (ref 80.0–100.0)
Platelets: 249 10*3/uL (ref 150–400)
RBC: 3.86 MIL/uL — ABNORMAL LOW (ref 4.22–5.81)
RDW: 13.2 % (ref 11.5–15.5)
WBC: 4.7 10*3/uL (ref 4.0–10.5)
nRBC: 0 % (ref 0.0–0.2)

## 2020-03-04 LAB — SARS CORONAVIRUS 2 (TAT 6-24 HRS): SARS Coronavirus 2: NEGATIVE

## 2020-03-08 ENCOUNTER — Ambulatory Visit
Admission: RE | Admit: 2020-03-08 | Discharge: 2020-03-08 | Disposition: A | Discharge status: Home / Self Care | Payer: PPO | Attending: Orthopedic Surgery | Admitting: Orthopedic Surgery

## 2020-03-08 ENCOUNTER — Encounter: Payer: Self-pay | Admitting: Orthopedic Surgery

## 2020-03-08 ENCOUNTER — Other Ambulatory Visit: Payer: Self-pay

## 2020-03-08 ENCOUNTER — Encounter
Admission: RE | Disposition: A | Discharge status: Home / Self Care | Payer: Self-pay | Source: Home / Self Care | Attending: Orthopedic Surgery

## 2020-03-08 ENCOUNTER — Ambulatory Visit: Payer: PPO | Admitting: Urgent Care

## 2020-03-08 DIAGNOSIS — M75122 Complete rotator cuff tear or rupture of left shoulder, not specified as traumatic: Secondary | ICD-10-CM | POA: Diagnosis not present

## 2020-03-08 DIAGNOSIS — M25512 Pain in left shoulder: Secondary | ICD-10-CM | POA: Diagnosis not present

## 2020-03-08 DIAGNOSIS — M75112 Incomplete rotator cuff tear or rupture of left shoulder, not specified as traumatic: Secondary | ICD-10-CM | POA: Diagnosis not present

## 2020-03-08 DIAGNOSIS — M7582 Other shoulder lesions, left shoulder: Secondary | ICD-10-CM | POA: Diagnosis not present

## 2020-03-08 DIAGNOSIS — Z79899 Other long term (current) drug therapy: Secondary | ICD-10-CM | POA: Insufficient documentation

## 2020-03-08 DIAGNOSIS — M7542 Impingement syndrome of left shoulder: Secondary | ICD-10-CM | POA: Diagnosis not present

## 2020-03-08 DIAGNOSIS — G8918 Other acute postprocedural pain: Secondary | ICD-10-CM | POA: Diagnosis not present

## 2020-03-08 DIAGNOSIS — Z7901 Long term (current) use of anticoagulants: Secondary | ICD-10-CM | POA: Diagnosis not present

## 2020-03-08 DIAGNOSIS — S46012A Strain of muscle(s) and tendon(s) of the rotator cuff of left shoulder, initial encounter: Secondary | ICD-10-CM | POA: Diagnosis not present

## 2020-03-08 HISTORY — PX: SHOULDER ARTHROSCOPY WITH SUBACROMIAL DECOMPRESSION AND OPEN ROTATOR C: SHX5688

## 2020-03-08 SURGERY — SHOULDER ARTHROSCOPY WITH SUBACROMIAL DECOMPRESSION AND OPEN ROTATOR CUFF REPAIR, OPEN BICEPS TENDON REPAIR
Anesthesia: General | Laterality: Left

## 2020-03-08 MED ORDER — PROPOFOL 10 MG/ML IV BOLUS
INTRAVENOUS | Status: DC | PRN
  Administered 2020-03-08: 25 ug/kg/min via INTRAVENOUS
  Administered 2020-03-08: 100 mg via INTRAVENOUS

## 2020-03-08 MED ORDER — FENTANYL CITRATE (PF) 100 MCG/2ML IJ SOLN
25.0000 ug | INTRAMUSCULAR | Status: DC | PRN
Start: 2020-03-08 — End: 2020-03-08

## 2020-03-08 MED ORDER — PROPOFOL 500 MG/50ML IV EMUL
INTRAVENOUS | Status: AC
  Filled 2020-03-08: qty 50

## 2020-03-08 MED ORDER — ACETAMINOPHEN 160 MG/5ML PO SOLN: 325.0000 mg | ORAL | Status: DC | PRN

## 2020-03-08 MED ORDER — MIDAZOLAM HCL 2 MG/2ML IJ SOLN
INTRAMUSCULAR | Status: DC | PRN
  Administered 2020-03-08: 2 mg via INTRAVENOUS
  Administered 2020-03-08: .5 mg via INTRAVENOUS

## 2020-03-08 MED ORDER — BUPIVACAINE HCL (PF) 0.5 % IJ SOLN
INTRAMUSCULAR | Status: DC | PRN
  Administered 2020-03-08: 20 mL via PERINEURAL

## 2020-03-08 MED ORDER — SODIUM CHLORIDE 0.9 % IR SOLN
Status: DC | PRN
  Administered 2020-03-08 (×27): 3000 mL

## 2020-03-08 MED ORDER — CEFAZOLIN SODIUM-DEXTROSE 2-3 GM-%(50ML) IV SOLR
INTRAVENOUS | Status: DC | PRN
  Administered 2020-03-08: 2 g via INTRAVENOUS

## 2020-03-08 MED ORDER — OXYCODONE HCL 5 MG PO TABS: 5.0000 mg | ORAL_TABLET | ORAL | 0 refills | Status: DC | PRN

## 2020-03-08 MED ORDER — BUPIVACAINE HCL (PF) 0.5 % IJ SOLN
INTRAMUSCULAR | Status: DC | PRN
  Administered 2020-03-08: 17 mL

## 2020-03-08 MED ORDER — BUPIVACAINE LIPOSOME 1.3 % IJ SUSP
INTRAMUSCULAR | Status: DC | PRN
Start: 2020-03-08 — End: 2020-03-08
  Administered 2020-03-08: 20 mL via PERINEURAL

## 2020-03-08 MED ORDER — ACETAMINOPHEN 500 MG PO TABS: 1000.0000 mg | ORAL_TABLET | Freq: Three times a day (TID) | ORAL | 2 refills | Status: DC

## 2020-03-08 MED ORDER — PHENYLEPHRINE HCL (PRESSORS) 10 MG/ML IV SOLN
INTRAVENOUS | Status: DC | PRN
  Administered 2020-03-08 (×3): 200 ug via INTRAVENOUS
  Administered 2020-03-08: 100 ug via INTRAVENOUS
  Administered 2020-03-08: 200 ug via INTRAVENOUS
  Administered 2020-03-08: 100 ug via INTRAVENOUS
  Administered 2020-03-08: 200 ug via INTRAVENOUS

## 2020-03-08 MED ORDER — LIDOCAINE HCL (PF) 2 % IJ SOLN
INTRAMUSCULAR | Status: AC
  Filled 2020-03-08: qty 5

## 2020-03-08 MED ORDER — CEFAZOLIN SODIUM-DEXTROSE 2-4 GM/100ML-% IV SOLN: 2.0000 g | INTRAVENOUS | Status: DC

## 2020-03-08 MED ORDER — FENTANYL CITRATE (PF) 100 MCG/2ML IJ SOLN
INTRAMUSCULAR | Status: DC | PRN
  Administered 2020-03-08: 25 ug via INTRAVENOUS
  Administered 2020-03-08: 50 ug via INTRAVENOUS

## 2020-03-08 MED ORDER — OXYCODONE HCL 5 MG/5ML PO SOLN: 5.0000 mg | Freq: Once | ORAL | Status: DC | PRN

## 2020-03-08 MED ORDER — LACTATED RINGERS IV SOLN
INTRAVENOUS | Status: DC | PRN
  Administered 2020-03-08 (×4): 1 mL

## 2020-03-08 MED ORDER — SODIUM CHLORIDE 0.9 % IV SOLN: INTRAVENOUS | Status: DC

## 2020-03-08 MED ORDER — ACETAMINOPHEN 325 MG PO TABS: 325.0000 mg | ORAL_TABLET | ORAL | Status: DC | PRN

## 2020-03-08 MED ORDER — ONDANSETRON HCL 4 MG/2ML IJ SOLN: 4.0000 mg | Freq: Once | INTRAMUSCULAR | Status: DC | PRN

## 2020-03-08 MED ORDER — OXYCODONE HCL 5 MG PO TABS: 5.0000 mg | ORAL_TABLET | Freq: Once | ORAL | Status: DC | PRN

## 2020-03-08 MED ORDER — ONDANSETRON 4 MG PO TBDP: 4.0000 mg | ORAL_TABLET | Freq: Three times a day (TID) | ORAL | 0 refills | Status: DC | PRN

## 2020-03-08 MED ORDER — LACTATED RINGERS IV SOLN: INTRAVENOUS | Status: DC

## 2020-03-08 MED ORDER — 0.9 % SODIUM CHLORIDE (POUR BTL) OPTIME
TOPICAL | Status: DC | PRN
  Administered 2020-03-08: 500 mL

## 2020-03-08 SURGICAL SUPPLY — 66 items
ADAPTER IRRIG TUBE 2 SPIKE SOL (ADAPTER) ×4 IMPLANT
ADH SKN CLS APL DERMABOND .7 (GAUZE/BANDAGES/DRESSINGS) ×1
ADPR TBG 2 SPK PMP STRL ASCP (ADAPTER) ×2
ANCH SUT 2 SWLK 19.1 CLS EYLT (Anchor) ×2 IMPLANT
ANCHOR SWIVELOCK BIO 4.75X19.1 (Anchor) ×4 IMPLANT
APL PRP STRL LF DISP 70% ISPRP (MISCELLANEOUS) ×1
BINDER ABDOMINAL  9 SM 30-45 (SOFTGOODS) ×1
BINDER ABDOMINAL 9 SM 30-45 (SOFTGOODS) ×1 IMPLANT
BUR BR 5.5 12 FLUTE (BURR) ×2 IMPLANT
BUR RADIUS 4.0X18.5 (BURR) ×2 IMPLANT
CANNULA PART THRD DISP 5.75X7 (CANNULA) ×4 IMPLANT
CANNULA PARTIAL THREAD 2X7 (CANNULA) ×2 IMPLANT
CHLORAPREP W/TINT 26 (MISCELLANEOUS) ×2 IMPLANT
COOLER POLAR GLACIER W/PUMP (MISCELLANEOUS) ×2 IMPLANT
COVER LIGHT HANDLE UNIVERSAL (MISCELLANEOUS) ×4 IMPLANT
DERMABOND ADVANCED (GAUZE/BANDAGES/DRESSINGS) ×1
DERMABOND ADVANCED .7 DNX12 (GAUZE/BANDAGES/DRESSINGS) ×1 IMPLANT
DRAPE IMP U-DRAPE 54X76 (DRAPES) ×4 IMPLANT
DRAPE INCISE IOBAN 66X45 STRL (DRAPES) ×2 IMPLANT
DRAPE SHEET LG 3/4 BI-LAMINATE (DRAPES) ×2 IMPLANT
DRAPE U-SHAPE 48X52 POLY STRL (PACKS) ×4 IMPLANT
DRSG TEGADERM 4X4.75 (GAUZE/BANDAGES/DRESSINGS) ×2 IMPLANT
ELECT REM PT RETURN 9FT ADLT (ELECTROSURGICAL) ×2
ELECTRODE REM PT RTRN 9FT ADLT (ELECTROSURGICAL) ×1 IMPLANT
GAUZE SPONGE 4X4 12PLY STRL (GAUZE/BANDAGES/DRESSINGS) ×2 IMPLANT
GAUZE XEROFORM 1X8 LF (GAUZE/BANDAGES/DRESSINGS) ×2 IMPLANT
GLOVE BIOGEL PI IND STRL 8 (GLOVE) ×1 IMPLANT
GLOVE BIOGEL PI INDICATOR 8 (GLOVE) ×1
GOWN STRL REIN 2XL XLG LVL4 (GOWN DISPOSABLE) ×2 IMPLANT
GOWN STRL REUS W/ TWL LRG LVL3 (GOWN DISPOSABLE) ×1 IMPLANT
GOWN STRL REUS W/TWL LRG LVL3 (GOWN DISPOSABLE) ×2
IV LACTATED RINGER IRRG 3000ML (IV SOLUTION) ×16
IV LR IRRIG 3000ML ARTHROMATIC (IV SOLUTION) ×8 IMPLANT
KIT STABILIZATION SHOULDER (MISCELLANEOUS) ×2 IMPLANT
KIT TURNOVER KIT A (KITS) ×2 IMPLANT
MANIFOLD 4PT FOR NEPTUNE1 (MISCELLANEOUS) ×2 IMPLANT
MASK FACE SPIDER DISP (MASK) ×2 IMPLANT
MAT ABSORB  FLUID 56X50 GRAY (MISCELLANEOUS) ×2
MAT ABSORB FLUID 56X50 GRAY (MISCELLANEOUS) ×2 IMPLANT
NDL SAFETY ECLIPSE 18X1.5 (NEEDLE) ×1 IMPLANT
NEEDLE HYPO 18GX1.5 SHARP (NEEDLE) ×2
NEEDLE SCORPION MULTI FIRE (NEEDLE) ×2 IMPLANT
PACK ARTHROSCOPY SHOULDER (MISCELLANEOUS) ×2 IMPLANT
PAD WRAPON POLAR SHDR UNIV (MISCELLANEOUS) ×1 IMPLANT
PENCIL SMOKE EVACUATOR (MISCELLANEOUS) ×2 IMPLANT
SET TUBE SUCT SHAVER OUTFL 24K (TUBING) ×2 IMPLANT
SET TUBE TIP INTRA-ARTICULAR (MISCELLANEOUS) ×2 IMPLANT
SLING ULTRA II LG (MISCELLANEOUS) ×2 IMPLANT
SPACER SUBACROMIAL INSPACE LG (Spacer) ×2 IMPLANT
SUT ETHILON 3-0 (SUTURE) ×2 IMPLANT
SUT ETHILON 3-0 FS-10 30 BLK (SUTURE) ×2
SUT MNCRL 4-0 (SUTURE) ×2
SUT MNCRL 4-0 27XMFL (SUTURE) ×1
SUT VIC AB 0 CT1 36 (SUTURE) ×2 IMPLANT
SUT VIC AB 2-0 CT2 27 (SUTURE) ×2 IMPLANT
SUTURE EHLN 3-0 FS-10 30 BLK (SUTURE) ×1 IMPLANT
SUTURE MNCRL 4-0 27XMF (SUTURE) ×1 IMPLANT
SUTURE TAPE 1.3 40 TPR END (SUTURE) ×1 IMPLANT
SUTURETAPE 1.3 40 TPR END (SUTURE) ×2
SYR 10ML LL (SYRINGE) ×2 IMPLANT
SYR 50ML LL SCALE MARK (SYRINGE) ×2 IMPLANT
TAPE MICROFOAM 4IN (TAPE) ×2 IMPLANT
TUBING ARTHRO INFLOW-ONLY STRL (TUBING) ×2 IMPLANT
TUBING CONNECTING 10 (TUBING) ×2 IMPLANT
WAND WEREWOLF FLOW 90D (MISCELLANEOUS) ×2 IMPLANT
WRAPON POLAR PAD SHDR UNIV (MISCELLANEOUS) ×2

## 2020-03-08 NOTE — Anesthesia Procedure Notes (Signed)
Procedure Name: LMA Insertion Date/Time: 03/08/2020 7:52 AM Performed by: Cameron Ali, CRNA Pre-anesthesia Checklist: Patient identified, Emergency Drugs available, Suction available, Timeout performed and Patient being monitored Patient Re-evaluated:Patient Re-evaluated prior to induction Oxygen Delivery Method: Circle system utilized Preoxygenation: Pre-oxygenation with 100% oxygen Induction Type: IV induction LMA: LMA inserted LMA Size: 4.0 Number of attempts: 1 Placement Confirmation: positive ETCO2 and breath sounds checked- equal and bilateral Tube secured with: Tape Dental Injury: Teeth and Oropharynx as per pre-operative assessment

## 2020-03-08 NOTE — Anesthesia Preprocedure Evaluation (Signed)
Anesthesia Evaluation  Patient identified by MRN, date of birth, ID band Patient awake    Reviewed: Allergy & Precautions, H&P , NPO status , Patient's Chart, lab work & pertinent test results  Airway Mallampati: I  TM Distance: >3 FB Neck ROM: full    Dental no notable dental hx. (+) Partial Upper   Pulmonary    Pulmonary exam normal breath sounds clear to auscultation       Cardiovascular + dysrhythmias Atrial Fibrillation  Rhythm:irregular Rate:Normal     Neuro/Psych CVA    GI/Hepatic GERD  ,  Endo/Other  diabetes  Renal/GU      Musculoskeletal   Abdominal   Peds  Hematology   Anesthesia Other Findings   Reproductive/Obstetrics                             Anesthesia Physical Anesthesia Plan  ASA: III  Anesthesia Plan: General LMA   Post-op Pain Management:  Regional for Post-op pain   Induction:   PONV Risk Score and Plan: 2 and Treatment may vary due to age or medical condition, Ondansetron and Dexamethasone  Airway Management Planned:   Additional Equipment:   Intra-op Plan:   Post-operative Plan:   Informed Consent: I have reviewed the patients History and Physical, chart, labs and discussed the procedure including the risks, benefits and alternatives for the proposed anesthesia with the patient or authorized representative who has indicated his/her understanding and acceptance.     Dental Advisory Given  Plan Discussed with: CRNA  Anesthesia Plan Comments:         Anesthesia Quick Evaluation

## 2020-03-08 NOTE — Progress Notes (Signed)
Assisted Mike Stella ANMD with left, ultrasound guided, interscalene block. Side rails up, monitors on throughout procedure. See vital signs in flow sheet. Tolerated Procedure well.   

## 2020-03-08 NOTE — Transfer of Care (Signed)
Immediate Anesthesia Transfer of Care Note  Patient: Timothy Wells  Procedure(s) Performed: Left shoulder arthroscopic subscapularis repair and biodegradable balloon spacer placement with partial infraspinatus repair (Left )  Patient Location: PACU  Anesthesia Type: General LMA  Level of Consciousness: awake, alert  and patient cooperative  Airway and Oxygen Therapy: Patient Spontanous Breathing and Patient connected to supplemental oxygen  Post-op Assessment: Post-op Vital signs reviewed, Patient's Cardiovascular Status Stable, Respiratory Function Stable, Patent Airway and No signs of Nausea or vomiting  Post-op Vital Signs: Reviewed and stable  Complications: No complications documented.

## 2020-03-08 NOTE — Discharge Instructions (Signed)
Post-Op Instructions - Rotator Cuff Repair  1. Bracing: You will wear a shoulder immobilizer or sling for 6 weeks.   2. Driving: No driving for 3 weeks post-op. When driving, do not wear the immobilizer. Ideally, we recommend no driving for 6 weeks while sling is in place as one arm will be immobilized.   3. Activity: No active lifting for 2 months. Wrist, hand, and elbow motion only. Avoid lifting the upper arm away from the body except for hygiene. You are permitted to bend and straighten the elbow passively only (no active elbow motion). You may use your hand and wrist for typing, writing, and managing utensils (cutting food). Do not lift more than a coffee cup for 8 weeks.  When sleeping or resting, inclined positions (recliner chair or wedge pillow) and a pillow under the forearm for support may provide better comfort for up to 4 weeks.  Avoid long distance travel for 4 weeks.  Return to normal activities after rotator cuff repair repair normally takes 6 months on average. If rehab goes very well, may be able to do most activities at 4 months, except overhead or contact sports.  4. Physical Therapy: Begins 3-4 days after surgery, and proceed 1 time per week for the first 6 weeks, then 1-2 times per week from weeks 6-20 post-op.  5. Medications:  - You will be provided a prescription for narcotic pain medicine. After surgery, take 1-2 narcotic tablets every 4 hours if needed for severe pain.  - A prescription for anti-nausea medication will be provided in case the narcotic medicine causes nausea - take 1 tablet every 6 hours only if nauseated.   - Take tylenol 1000 mg (2 Extra Strength tablets or 3 regular strength) every 8 hours for pain.  May decrease or stop tylenol 5 days after surgery if you are having minimal pain. - Resume home anticoagulation on Postoperative day #1 (day after surgery) - DO NOT take ANY nonsteroidal anti-inflammatory pain medications (Advil, Motrin, Ibuprofen, Aleve,  Naproxen, or Naprosyn). These medicines can inhibit healing of your shoulder repair.    If you are taking prescription medication for anxiety, depression, insomnia, muscle spasm, chronic pain, or for attention deficit disorder, you are advised that you are at a higher risk of adverse effects with use of narcotics post-op, including narcotic addiction/dependence, depressed breathing, death. If you use non-prescribed substances: alcohol, marijuana, cocaine, heroin, methamphetamines, etc., you are at a higher risk of adverse effects with use of narcotics post-op, including narcotic addiction/dependence, depressed breathing, death. You are advised that taking > 50 morphine milligram equivalents (MME) of narcotic pain medication per day results in twice the risk of overdose or death. For your prescription provided: oxycodone 5 mg - taking more than 6 tablets per day would result in > 50 morphine milligram equivalents (MME) of narcotic pain medication. Be advised that we will prescribe narcotics short-term, for acute post-operative pain only - 3 weeks for major operations such as shoulder repair/reconstruction surgeries.     6. Post-Op Appointment:  Your first post-op appointment will be 10-14 days post-op.  7. Work or School: For most, but not all procedures, we advise staying out of work or school for at least 1 to 2 weeks in order to recover from the stress of surgery and to allow time for healing.   If you need a work or school note this can be provided.   8. Smoking: If you are a smoker, you need to refrain from smoking in the  postoperative period. The nicotine in cigarettes will inhibit healing of your shoulder repair and decrease the chance of successful repair. Similarly, nicotine containing products (gum, patches) should be avoided.   Post-operative Brace: Apply and remove the brace you received as you were instructed to at the time of fitting and as described in detail as the braces  instructions for use indicate.  Wear the brace for the period of time prescribed by your physician.  The brace can be cleaned with soap and water and allowed to air dry only.  Should the brace result in increased pain, decreased feeling (numbness/tingling), increased swelling or an overall worsening of your medical condition, please contact your doctor immediately.  If an emergency situation occurs as a result of wearing the brace after normal business hours, please dial 911 and seek immediate medical attention.  Let your doctor know if you have any further questions about the brace issued to you. Refer to the shoulder sling instructions for use if you have any questions regarding the correct fit of your shoulder sling.  [place] for Troubleshooting: [phone]  Video that illustrates how to properly use a shoulder sling: "Instructions for Proper Use of an Orthopaedic Sling" [email or web address]         Information for Discharge Teaching: EXPAREL (bupivacaine liposome injectable suspension)   Your surgeon or anesthesiologist gave you EXPAREL(bupivacaine) to help control your pain after surgery.   EXPAREL is a local anesthetic that provides pain relief by numbing the tissue around the surgical site.  EXPAREL is designed to release pain medication over time and can control pain for up to 72 hours.  Depending on how you respond to EXPAREL, you may require less pain medication during your recovery.  Possible side effects:  Temporary loss of sensation or ability to move in the area where bupivacaine was injected.  Nausea, vomiting, constipation  Rarely, numbness and tingling in your mouth or lips, lightheadedness, or anxiety may occur.  Call your doctor right away if you think you may be experiencing any of these sensations, or if you have other questions regarding possible side effects.  Follow all other discharge instructions given to you by your  surgeon or nurse. Eat a healthy diet and drink plenty of water or other fluids.  If you return to the hospital for any reason within 96 hours following the administration of EXPAREL, it is important for health care providers to know that you have received this anesthetic. A teal colored band has been placed on your arm with the date, time and amount of EXPAREL you have received in order to alert and inform your health care providers. Please leave this armband in place for the full 96 hours following administration, and then you may remove the band.  General Anesthesia, Adult, Care After This sheet gives you information about how to care for yourself after your procedure. Your health care provider may also give you more specific instructions. If you have problems or questions, contact your health care provider. What can I expect after the procedure? After the procedure, the following side effects are common:  Pain or discomfort at the IV site.  Nausea.  Vomiting.  Sore throat.  Trouble concentrating.  Feeling cold or chills.  Weak or tired.  Sleepiness and fatigue.  Soreness and body aches. These side effects can affect parts of the body that were not involved in surgery. Follow these instructions at home:  For at least 24 hours after the procedure:  Have a responsible  adult stay with you. It is important to have someone help care for you until you are awake and alert.  Rest as needed.  Do not: ? Participate in activities in which you could fall or become injured. ? Drive. ? Use heavy machinery. ? Drink alcohol. ? Take sleeping pills or medicines that cause drowsiness. ? Make important decisions or sign legal documents. ? Take care of children on your own. Eating and drinking  Follow any instructions from your health care provider about eating or drinking restrictions.  When you feel hungry, start by eating small amounts of foods that are soft and easy to digest (bland),  such as toast. Gradually return to your regular diet.  Drink enough fluid to keep your urine pale yellow.  If you vomit, rehydrate by drinking water, juice, or clear broth. General instructions  If you have sleep apnea, surgery and certain medicines can increase your risk for breathing problems. Follow instructions from your health care provider about wearing your sleep device: ? Anytime you are sleeping, including during daytime naps. ? While taking prescription pain medicines, sleeping medicines, or medicines that make you drowsy.  Return to your normal activities as told by your health care provider. Ask your health care provider what activities are safe for you.  Take over-the-counter and prescription medicines only as told by your health care provider.  If you smoke, do not smoke without supervision.  Keep all follow-up visits as told by your health care provider. This is important. Contact a health care provider if:  You have nausea or vomiting that does not get better with medicine.  You cannot eat or drink without vomiting.  You have pain that does not get better with medicine.  You are unable to pass urine.  You develop a skin rash.  You have a fever.  You have redness around your IV site that gets worse. Get help right away if:  You have difficulty breathing.  You have chest pain.  You have blood in your urine or stool, or you vomit blood. Summary  After the procedure, it is common to have a sore throat or nausea. It is also common to feel tired.  Have a responsible adult stay with you for the first 24 hours after general anesthesia. It is important to have someone help care for you until you are awake and alert.  When you feel hungry, start by eating small amounts of foods that are soft and easy to digest (bland), such as toast. Gradually return to your regular diet.  Drink enough fluid to keep your urine pale yellow.  Return to your normal activities as  told by your health care provider. Ask your health care provider what activities are safe for you. This information is not intended to replace advice given to you by your health care provider. Make sure you discuss any questions you have with your health care provider. Document Revised: 02/16/2017 Document Reviewed: 09/29/2016 Elsevier Patient Education  [place].

## 2020-03-08 NOTE — Anesthesia Postprocedure Evaluation (Signed)
Anesthesia Post Note  Patient: Timothy Wells  Procedure(s) Performed: Left shoulder arthroscopic subscapularis repair and biodegradable balloon spacer placement with partial infraspinatus repair (Left )     Patient location during evaluation: PACU Anesthesia Type: General Level of consciousness: awake and alert and oriented Pain management: satisfactory to patient Vital Signs Assessment: post-procedure vital signs reviewed and stable Respiratory status: spontaneous breathing, nonlabored ventilation and respiratory function stable Cardiovascular status: blood pressure returned to baseline and stable Postop Assessment: Adequate PO intake and No signs of nausea or vomiting Anesthetic complications: no   No complications documented.  Raliegh Ip

## 2020-03-08 NOTE — Anesthesia Procedure Notes (Signed)
Procedure Name: MAC Date/Time: 03/08/2020 8:21 AM Performed by: Cameron Ali, CRNA Pre-anesthesia Checklist: Patient identified, Emergency Drugs available, Suction available, Patient being monitored and Timeout performed Patient Re-evaluated:Patient Re-evaluated prior to induction Oxygen Delivery Method: Simple face mask Placement Confirmation: positive ETCO2 and breath sounds checked- equal and bilateral

## 2020-03-08 NOTE — H&P (Signed)
Paper H&P to be scanned into permanent record. H&P reviewed. No significant changes noted.  

## 2020-03-08 NOTE — Anesthesia Procedure Notes (Addendum)
Anesthesia Regional Block: Interscalene brachial plexus block   Pre-Anesthetic Checklist: ,, timeout performed, Correct Patient, Correct Site, Correct Laterality, Correct Procedure, Correct Position, site marked, Risks and benefits discussed,  Surgical consent,  Pre-op evaluation,  At surgeon's request and post-op pain management  Laterality: Left  Prep: chloraprep       Needles:  Injection technique: Single-shot  Needle Type: Stimiplex     Needle Length: 10cm  Needle Gauge: 21     Additional Needles:   Procedures:,,,, ultrasound used (permanent image in chart),,,,  Narrative:  Start time: 03/08/2020 7:07 AM End time: 03/08/2020 7:10 AM Injection made incrementally with aspirations every 5 mL.  Performed by: Personally  Anesthesiologist: Ronelle Nigh, MD  Additional Notes: Functioning IV was confirmed and monitors applied. Ultrasound guidance: relevant anatomy identified, needle position confirmed, local anesthetic spread visualized around nerve(s)., vascular puncture avoided.  Image printed for medical record.  Negative aspiration and no paresthesias; incremental administration of local anesthetic. The patient tolerated the procedure well. Vitals signes recorded in RN notes.

## 2020-03-08 NOTE — Op Note (Signed)
SURGERY DATE: 03/08/2020  PRE-OP DIAGNOSIS:  1. Left rotator cuff tear (subscapularis, supraspinatus, infraspinatus) 2. Left proximal biceps rupture  POST-OP DIAGNOSIS: 1. Left rotator cuff tear (subscapularis, supraspinatus, infraspinatus) 2. Left proximal biceps rupture  PROCEDURES:  1. Left arthroscopic rotator cuff repair (subscapularis and partial infraspinatus) 2. Left arthroscopic implantation of subacromial biodegradable balloon spacer 3. Left arthroscopic extensive debridement of shoulder (glenohumeral and subacromial spaces)  SURGEON: Cato Mulligan, MD  ASSISTANT: Anitra Lauth, PA  ANESTHESIA: Gen with Exparil interscalene block  ESTIMATED BLOOD LOSS: 5cc  DRAINS:  none  TOTAL IV FLUIDS: per anesthesia   SPECIMENS: none  IMPLANTS:  - [employer] InSpace biodegradable balloon spacer - Large - Arthrex 4.75 mm swivel lock x2   OPERATIVE FINDINGS:  Examination under anesthesia: A careful examination under anesthesia was performed.  Passive range of motion was: FF: 140; ER at side: 50; ER in abduction: 90; IR in abduction: 50.  Anterior load shift: NT.  Posterior load shift: NT.  Sulcus in neutral: NT.  Sulcus in ER: NT.    Intra-operative findings: A thorough arthroscopic examination of the shoulder was performed.  The findings are: 1. Biceps tendon: not visualized 2. Superior labrum: injected with surrounding synovitis 3. Posterior labrum and capsule: normal 4. Inferior capsule and inferior recess: normal 5. Glenoid cartilage surface: Grade 1 changes  6. Supraspinatus attachment: full-thickness tear with retraction medial to the glenoid 7. Posterior rotator cuff attachment: Complete tear of the infraspinatus 8. Humeral head articular cartilage: normal 9. Rotator interval: significant synovitis 10: Subscapularis tendon: full-thickness tear of the superior subscapularis 11. Anterior labrum: degenerative 12. IGHL: significant synovitis around IGHL  OPERATIVE  REPORT:   Indications for procedure: CALIN ROMINGER is a 70 y.o. male with with an approximately 65-month history of shoulder pain that began after lifting a heavy object at work overhead.  There is significant difficulty with elevation of the arm and overhead activity since that time. Since that time, the patient has failed non-operative management including activity modification, medical management, corticosteroid injection, and extensive physical therapy.  He had regained overhead range of motion, but still had significant pain and weakness with lifting away from the body and overhead, which is necessary for his work. 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  There is significant atrophy of the supraspinatus, but no significant atrophy of the subscapularis.  Given the continued symptoms and desire for more demanding activity, we decided to proceed with surgical management.  We did discuss that placement of subacromial balloon spacer was just recently approved for use and long-term data was incomplete.  Patient was in understanding and given the encouraging short to medium term results, we agreed to proceed.    Procedure in detail:  I identified Qwentin Maner Leisey in the pre-operative holding area.  I marked the operative shoulder with my initials. I reviewed the risks and benefits of the proposed surgical intervention, and the patient (and/or patient's guardian) wished to proceed.  Anesthesia was then performed with an Exparil interscalene block.  The patient was transferred to the operative suite and placed in the beach chair position.    SCDs were placed on the lower extremities. Appropriate IV antibiotics were administered prior to incision. The operative upper extremity was then prepped and draped in standard fashion. A time out was performed confirming the correct extremity, correct patient, and  correct procedure.   I then created a standard  posterior portal with an 11 blade. The glenohumeral joint was easily entered with a blunt trochar and the arthroscope introduced. The findings of diagnostic arthroscopy are described above.  A standard anterior portal was made.  I debrided degenerative tissue including the synovitic tissue about the rotator interval and IGHL as well as the anterior and superior labrum. I then coagulated the inflamed synovium to obtain hemostasis and reduce the risk of post-operative swelling using an Arthrocare radiofrequency device. The biceps tendon was cut at its insertion on the superior labrum with arthroscopic scissors.  The subscapularis tear was identified.  A superior anterolateral portal was made under needle localization.  A 7 mm cannula was placed.  The, comma tissue indicating the superolateral border of the subscapularis was identified readily.  The tip of the coracoid as well as the conjoined tendon and coracoacromial ligaments were visualized after debriding rotator interval tissue.  Tissue about the subscapularis was released anteriorly, superiorly, and posteriorly to allow for improved mobilization.  The comma tissue was tagged with a FiberWire suture and tension on this allowed for appropriate mobilization of the subscapularis.  A 70 degree arthroscope was utilized, and the lesser tuberosity footprint was prepared with a combination of electrocautery and an arthroscopic curette.  A Scorpion suture passing device was used to pass one SutureTape in a mattress fashion through the subscapularis tendon.  These were passed from the anterolateral portal and after passage, the sutures were retrieved from the anterior portal.  These were passed through a 4.75 mm SwiveLock and placed into the lesser tuberosity at the footprint of the subscapularis tendon with the arm in a neutral position.  This appropriately reduced the subscapularis tear.  The arm was then internally and  externally rotated and the subscapularis was noted to move appropriately with rotation.  Next, the arthroscope was then introduced into the subacromial space. A direct lateral portal was created with an 11-blade after spinal needle localization. An extensive subacromial bursectomy was performed using a combination of the shaver and Arthrocare wand.  The CA ligament was left intact.  The superior rotator cuff was examined and it could not be reduced in an appropriate fashion after appropriate mobilization.  The arm was then internally rotated.  The posterior aspect of the greater tuberosity footprint was prepared by removing it of soft tissue and creating a a bleeding bony bed in anticipation of a partial repair of the infraspinatus.  A fiber tape suture was passed in a mattress fashion through the anterior aspect of the infraspinatus.  This was loaded onto a 4.75 mm SwiveLock anchor and placed into the lateral aspect of the greater tuberosity along the posterior margin.  This allowed for a partial repair of the infraspinatus.  Next, the distance from the torn edge of the rotator cuff to the lateral edge of the greater tuberosity was measured and an appropriately sized balloon spacer was selected.  The lateral portal was appropriately dilated using a hemostat to allow for easy passage of the implant.  The implant was held in appropriate position with the medial edge just medial to the edge of the rotator cuff and the lateral edge even with the lateral edge of the greater tuberosity.  It was filled with saline and an appropriate amount of saline was removed allowing for appropriate conformity to the humeral head.  The humeral head was noted to move inferiorly into a more reduced position with inflation of the balloon spacer.  The insertion device was then disconnected and the shoulder  was taken through a range of motion.  There is no displacement of the subacromial space or with motion.  Arthroscopic fluid was  removed from the joint.  The portals were closed with 3-0 Nylon. Xeroform was applied to the incisions. A sterile dressing was applied, followed by a Polar Care sleeve and a SlingShot shoulder immobilizer/sling. The patient was awakened from anesthesia without difficulty and was transferred to the PACU in stable condition.   Of note, assistance from a PA was essential to performing the surgery.  PA was present for the entire surgery.  PA assisted with patient positioning, retraction, instrumentation, and wound closure. The surgery would have been more difficult and had longer operative time without PA assistance.   Additionally, this case had increased complexity compared to standard arthroscopic rotator cuff repair given that partial repair of the infraspinatus was performed in addition to the subscapularis repair.  Partial repair of the infraspinatus tear increased surgical time by approximately 30 minutes minutes and increased complexity due to additional preparation and use of additional implants.   COMPLICATIONS: none  DISPOSITION: plan for discharge home after recovery in PACU   POSTOPERATIVE PLAN: Remain in sling (except hygiene and elbow/wrist/hand RoM exercises as instructed by PT) x 6 weeks and NWB for this time. PT to begin 3-4 days after surgery.  Use large rotator cuff repair rehab protocol with subscapularis repair.  ASA 325mg  daily x 2 weeks for DVT ppx.

## 2020-03-08 NOTE — H&P (Deleted)
SURGERY DATE: 03/08/2020  PRE-OP DIAGNOSIS:  1. Left rotator cuff tear (subscapularis, supraspinatus, infraspinatus) 2. Left proximal biceps rupture  POST-OP DIAGNOSIS: 1. Left rotator cuff tear (subscapularis, supraspinatus, infraspinatus) 2. Left proximal biceps rupture  PROCEDURES:  1. Left arthroscopic rotator cuff repair (subscapularis and partial infraspinatus) 2. Left arthroscopic implantation of subacromial biodegradable balloon spacer 3. Left arthroscopic extensive debridement of shoulder (glenohumeral and subacromial spaces)  SURGEON: Cato Mulligan, MD  ASSISTANT: Anitra Lauth, PA  ANESTHESIA: Gen with Exparil interscalene block  ESTIMATED BLOOD LOSS: 5cc  DRAINS:  none  TOTAL IV FLUIDS: per anesthesia   SPECIMENS: none  IMPLANTS:  - [employer] InSpace biodegradable balloon spacer - Large - Arthrex 4.75 mm swivel lock x2   OPERATIVE FINDINGS:  Examination under anesthesia: A careful examination under anesthesia was performed.  Passive range of motion was: FF: 140; ER at side: 50; ER in abduction: 90; IR in abduction: 50.  Anterior load shift: NT.  Posterior load shift: NT.  Sulcus in neutral: NT.  Sulcus in ER: NT.    Intra-operative findings: A thorough arthroscopic examination of the shoulder was performed.  The findings are: 1. Biceps tendon: not visualized 2. Superior labrum: injected with surrounding synovitis 3. Posterior labrum and capsule: normal 4. Inferior capsule and inferior recess: normal 5. Glenoid cartilage surface: Grade 1 changes  6. Supraspinatus attachment: full-thickness tear with retraction medial to the glenoid 7. Posterior rotator cuff attachment: Complete tear of the infraspinatus 8. Humeral head articular cartilage: normal 9. Rotator interval: significant synovitis 10: Subscapularis tendon: full-thickness tear of the superior subscapularis 11. Anterior labrum: degenerative 12. IGHL: significant synovitis around IGHL  OPERATIVE  REPORT:   Indications for procedure: Timothy Wells is a 70 y.o. male with with an approximately 12-month history of shoulder pain that began after lifting a heavy object at work overhead.  There is significant difficulty with elevation of the arm and overhead activity since that time. Since that time, the patient has failed non-operative management including activity modification, medical management, corticosteroid injection, and extensive physical therapy.  He had regained overhead range of motion, but still had significant pain and weakness with lifting away from the body and overhead, which is necessary for his work. 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  There is significant atrophy of the supraspinatus, but no significant atrophy of the subscapularis.  Given the continued symptoms and desire for more demanding activity, we decided to proceed with surgical management.  We did discuss that placement of subacromial balloon spacer was just recently approved for use and long-term data was incomplete.  Patient was in understanding and given the encouraging short to medium term results, we agreed to proceed.    Procedure in detail:  I identified Timothy Wells in the pre-operative holding area.  I marked the operative shoulder with my initials. I reviewed the risks and benefits of the proposed surgical intervention, and the patient (and/or patient's guardian) wished to proceed.  Anesthesia was then performed with an Exparil interscalene block.  The patient was transferred to the operative suite and placed in the beach chair position.    SCDs were placed on the lower extremities. Appropriate IV antibiotics were administered prior to incision. The operative upper extremity was then prepped and draped in standard fashion. A time out was performed confirming the correct extremity, correct patient, and  correct procedure.   I then created a standard  posterior portal with an 11 blade. The glenohumeral joint was easily entered with a blunt trochar and the arthroscope introduced. The findings of diagnostic arthroscopy are described above.  A standard anterior portal was made.  I debrided degenerative tissue including the synovitic tissue about the rotator interval and IGHL as well as the anterior and superior labrum. I then coagulated the inflamed synovium to obtain hemostasis and reduce the risk of post-operative swelling using an Arthrocare radiofrequency device. The biceps tendon was cut at its insertion on the superior labrum with arthroscopic scissors.  The subscapularis tear was identified.  A superior anterolateral portal was made under needle localization.  A 7 mm cannula was placed.  The, comma tissue indicating the superolateral border of the subscapularis was identified readily.  The tip of the coracoid as well as the conjoined tendon and coracoacromial ligaments were visualized after debriding rotator interval tissue.  Tissue about the subscapularis was released anteriorly, superiorly, and posteriorly to allow for improved mobilization.  The comma tissue was tagged with a FiberWire suture and tension on this allowed for appropriate mobilization of the subscapularis.  A 70 degree arthroscope was utilized, and the lesser tuberosity footprint was prepared with a combination of electrocautery and an arthroscopic curette.  A Scorpion suture passing device was used to pass one SutureTape in a mattress fashion through the subscapularis tendon.  These were passed from the anterolateral portal and after passage, the sutures were retrieved from the anterior portal.  These were passed through a 4.75 mm SwiveLock and placed into the lesser tuberosity at the footprint of the subscapularis tendon with the arm in a neutral position.  This appropriately reduced the subscapularis tear.  The arm was then internally and  externally rotated and the subscapularis was noted to move appropriately with rotation.  Next, the arthroscope was then introduced into the subacromial space. A direct lateral portal was created with an 11-blade after spinal needle localization. An extensive subacromial bursectomy was performed using a combination of the shaver and Arthrocare wand.  The CA ligament was left intact.  The superior rotator cuff was examined and it could not be reduced in an appropriate fashion after appropriate mobilization.  The arm was then internally rotated.  The posterior aspect of the greater tuberosity footprint was prepared by removing it of soft tissue and creating a a bleeding bony bed in anticipation of a partial repair of the infraspinatus.  A fiber tape suture was passed in a mattress fashion through the anterior aspect of the infraspinatus.  This was loaded onto a 4.75 mm SwiveLock anchor and placed into the lateral aspect of the greater tuberosity along the posterior margin.  This allowed for a partial repair of the infraspinatus.  Next, the distance from the torn edge of the rotator cuff to the lateral edge of the greater tuberosity was measured and an appropriately sized balloon spacer was selected.  The lateral portal was appropriately dilated using a hemostat to allow for easy passage of the implant.  The implant was held in appropriate position with the medial edge just medial to the edge of the rotator cuff and the lateral edge even with the lateral edge of the greater tuberosity.  It was filled with saline and an appropriate amount of saline was removed allowing for appropriate conformity to the humeral head.  The humeral head was noted to move inferiorly into a more reduced position with inflation of the balloon spacer.  The insertion device was then disconnected and the shoulder  was taken through a range of motion.  There is no displacement of the subacromial space or with motion.  Arthroscopic fluid was  removed from the joint.  The portals were closed with 3-0 Nylon. Xeroform was applied to the incisions. A sterile dressing was applied, followed by a Polar Care sleeve and a SlingShot shoulder immobilizer/sling. The patient was awakened from anesthesia without difficulty and was transferred to the PACU in stable condition.   Of note, assistance from a PA was essential to performing the surgery.  PA was present for the entire surgery.  PA assisted with patient positioning, retraction, instrumentation, and wound closure. The surgery would have been more difficult and had longer operative time without PA assistance.   Additionally, this case had increased complexity compared to standard arthroscopic rotator cuff repair given that partial repair of the infraspinatus was performed in addition to the subscapularis repair.  Partial repair of the infraspinatus tear increased surgical time by approximately 30 minutes minutes and increased complexity due to additional preparation and use of additional implants.   COMPLICATIONS: none  DISPOSITION: plan for discharge home after recovery in PACU   POSTOPERATIVE PLAN: Remain in sling (except hygiene and elbow/wrist/hand RoM exercises as instructed by PT) x 6 weeks and NWB for this time. PT to begin 3-4 days after surgery.  Use large rotator cuff repair rehab protocol with subscapularis repair.  ASA 325mg  daily x 2 weeks for DVT ppx.

## 2020-03-09 ENCOUNTER — Encounter: Payer: Self-pay | Admitting: Orthopedic Surgery

## 2020-03-12 DIAGNOSIS — M25512 Pain in left shoulder: Secondary | ICD-10-CM | POA: Diagnosis not present

## 2020-03-12 DIAGNOSIS — M6281 Muscle weakness (generalized): Secondary | ICD-10-CM | POA: Diagnosis not present

## 2020-03-12 DIAGNOSIS — M25612 Stiffness of left shoulder, not elsewhere classified: Secondary | ICD-10-CM | POA: Diagnosis not present

## 2020-03-12 DIAGNOSIS — Z9889 Other specified postprocedural states: Secondary | ICD-10-CM | POA: Diagnosis not present

## 2020-03-18 DIAGNOSIS — M6281 Muscle weakness (generalized): Secondary | ICD-10-CM | POA: Diagnosis not present

## 2020-03-18 DIAGNOSIS — M25612 Stiffness of left shoulder, not elsewhere classified: Secondary | ICD-10-CM | POA: Diagnosis not present

## 2020-03-18 DIAGNOSIS — M25512 Pain in left shoulder: Secondary | ICD-10-CM | POA: Diagnosis not present

## 2020-03-18 DIAGNOSIS — Z9889 Other specified postprocedural states: Secondary | ICD-10-CM | POA: Diagnosis not present

## 2020-03-23 DIAGNOSIS — M75122 Complete rotator cuff tear or rupture of left shoulder, not specified as traumatic: Secondary | ICD-10-CM | POA: Diagnosis not present

## 2020-03-24 DIAGNOSIS — M6281 Muscle weakness (generalized): Secondary | ICD-10-CM | POA: Diagnosis not present

## 2020-03-24 DIAGNOSIS — M25512 Pain in left shoulder: Secondary | ICD-10-CM | POA: Diagnosis not present

## 2020-03-24 DIAGNOSIS — Z9889 Other specified postprocedural states: Secondary | ICD-10-CM | POA: Diagnosis not present

## 2020-03-24 DIAGNOSIS — M25612 Stiffness of left shoulder, not elsewhere classified: Secondary | ICD-10-CM | POA: Diagnosis not present

## 2020-03-31 DIAGNOSIS — M25612 Stiffness of left shoulder, not elsewhere classified: Secondary | ICD-10-CM | POA: Diagnosis not present

## 2020-03-31 DIAGNOSIS — M6281 Muscle weakness (generalized): Secondary | ICD-10-CM | POA: Diagnosis not present

## 2020-03-31 DIAGNOSIS — M25512 Pain in left shoulder: Secondary | ICD-10-CM | POA: Diagnosis not present

## 2020-03-31 DIAGNOSIS — Z9889 Other specified postprocedural states: Secondary | ICD-10-CM | POA: Diagnosis not present

## 2020-04-07 DIAGNOSIS — M25612 Stiffness of left shoulder, not elsewhere classified: Secondary | ICD-10-CM | POA: Diagnosis not present

## 2020-04-07 DIAGNOSIS — Z9889 Other specified postprocedural states: Secondary | ICD-10-CM | POA: Diagnosis not present

## 2020-04-07 DIAGNOSIS — M25512 Pain in left shoulder: Secondary | ICD-10-CM | POA: Diagnosis not present

## 2020-04-07 DIAGNOSIS — M6281 Muscle weakness (generalized): Secondary | ICD-10-CM | POA: Diagnosis not present

## 2020-04-13 DIAGNOSIS — E782 Mixed hyperlipidemia: Secondary | ICD-10-CM | POA: Diagnosis not present

## 2020-04-13 DIAGNOSIS — R600 Localized edema: Secondary | ICD-10-CM | POA: Diagnosis not present

## 2020-04-13 DIAGNOSIS — R0789 Other chest pain: Secondary | ICD-10-CM | POA: Diagnosis not present

## 2020-04-13 DIAGNOSIS — I4891 Unspecified atrial fibrillation: Secondary | ICD-10-CM | POA: Diagnosis not present

## 2020-04-13 DIAGNOSIS — R002 Palpitations: Secondary | ICD-10-CM | POA: Diagnosis not present

## 2020-04-13 DIAGNOSIS — Z8673 Personal history of transient ischemic attack (TIA), and cerebral infarction without residual deficits: Secondary | ICD-10-CM | POA: Diagnosis not present

## 2020-04-14 DIAGNOSIS — M25612 Stiffness of left shoulder, not elsewhere classified: Secondary | ICD-10-CM | POA: Diagnosis not present

## 2020-04-14 DIAGNOSIS — M25512 Pain in left shoulder: Secondary | ICD-10-CM | POA: Diagnosis not present

## 2020-04-14 DIAGNOSIS — Z9889 Other specified postprocedural states: Secondary | ICD-10-CM | POA: Diagnosis not present

## 2020-04-14 DIAGNOSIS — M6281 Muscle weakness (generalized): Secondary | ICD-10-CM | POA: Diagnosis not present

## 2020-04-21 DIAGNOSIS — Z9889 Other specified postprocedural states: Secondary | ICD-10-CM | POA: Diagnosis not present

## 2020-04-21 DIAGNOSIS — M25512 Pain in left shoulder: Secondary | ICD-10-CM | POA: Diagnosis not present

## 2020-04-21 DIAGNOSIS — M25612 Stiffness of left shoulder, not elsewhere classified: Secondary | ICD-10-CM | POA: Diagnosis not present

## 2020-04-21 DIAGNOSIS — M6281 Muscle weakness (generalized): Secondary | ICD-10-CM | POA: Diagnosis not present

## 2020-04-26 DIAGNOSIS — M25512 Pain in left shoulder: Secondary | ICD-10-CM | POA: Diagnosis not present

## 2020-04-26 DIAGNOSIS — Z9889 Other specified postprocedural states: Secondary | ICD-10-CM | POA: Diagnosis not present

## 2020-04-26 DIAGNOSIS — M25612 Stiffness of left shoulder, not elsewhere classified: Secondary | ICD-10-CM | POA: Diagnosis not present

## 2020-04-26 DIAGNOSIS — M6281 Muscle weakness (generalized): Secondary | ICD-10-CM | POA: Diagnosis not present

## 2020-04-27 DIAGNOSIS — I4891 Unspecified atrial fibrillation: Secondary | ICD-10-CM | POA: Diagnosis not present

## 2020-04-30 DIAGNOSIS — Z9889 Other specified postprocedural states: Secondary | ICD-10-CM | POA: Diagnosis not present

## 2020-04-30 DIAGNOSIS — M25512 Pain in left shoulder: Secondary | ICD-10-CM | POA: Diagnosis not present

## 2020-04-30 DIAGNOSIS — M25612 Stiffness of left shoulder, not elsewhere classified: Secondary | ICD-10-CM | POA: Diagnosis not present

## 2020-04-30 DIAGNOSIS — M6281 Muscle weakness (generalized): Secondary | ICD-10-CM | POA: Diagnosis not present

## 2020-05-04 DIAGNOSIS — M25612 Stiffness of left shoulder, not elsewhere classified: Secondary | ICD-10-CM | POA: Diagnosis not present

## 2020-05-04 DIAGNOSIS — M6281 Muscle weakness (generalized): Secondary | ICD-10-CM | POA: Diagnosis not present

## 2020-05-04 DIAGNOSIS — Z9889 Other specified postprocedural states: Secondary | ICD-10-CM | POA: Diagnosis not present

## 2020-05-04 DIAGNOSIS — M25512 Pain in left shoulder: Secondary | ICD-10-CM | POA: Diagnosis not present

## 2020-05-06 DIAGNOSIS — M25512 Pain in left shoulder: Secondary | ICD-10-CM | POA: Diagnosis not present

## 2020-05-06 DIAGNOSIS — Z9889 Other specified postprocedural states: Secondary | ICD-10-CM | POA: Diagnosis not present

## 2020-05-06 DIAGNOSIS — M25612 Stiffness of left shoulder, not elsewhere classified: Secondary | ICD-10-CM | POA: Diagnosis not present

## 2020-05-06 DIAGNOSIS — M6281 Muscle weakness (generalized): Secondary | ICD-10-CM | POA: Diagnosis not present

## 2020-05-10 DIAGNOSIS — Z9889 Other specified postprocedural states: Secondary | ICD-10-CM | POA: Diagnosis not present

## 2020-05-10 DIAGNOSIS — M25512 Pain in left shoulder: Secondary | ICD-10-CM | POA: Diagnosis not present

## 2020-05-10 DIAGNOSIS — M25612 Stiffness of left shoulder, not elsewhere classified: Secondary | ICD-10-CM | POA: Diagnosis not present

## 2020-05-10 DIAGNOSIS — M6281 Muscle weakness (generalized): Secondary | ICD-10-CM | POA: Diagnosis not present

## 2020-05-11 DIAGNOSIS — R600 Localized edema: Secondary | ICD-10-CM | POA: Diagnosis not present

## 2020-05-11 DIAGNOSIS — R002 Palpitations: Secondary | ICD-10-CM | POA: Diagnosis not present

## 2020-05-11 DIAGNOSIS — R000 Tachycardia, unspecified: Secondary | ICD-10-CM | POA: Diagnosis not present

## 2020-05-11 DIAGNOSIS — E782 Mixed hyperlipidemia: Secondary | ICD-10-CM | POA: Diagnosis not present

## 2020-05-11 DIAGNOSIS — I480 Paroxysmal atrial fibrillation: Secondary | ICD-10-CM | POA: Diagnosis not present

## 2020-05-11 DIAGNOSIS — Z8673 Personal history of transient ischemic attack (TIA), and cerebral infarction without residual deficits: Secondary | ICD-10-CM | POA: Diagnosis not present

## 2020-05-11 DIAGNOSIS — I10 Essential (primary) hypertension: Secondary | ICD-10-CM | POA: Diagnosis not present

## 2020-05-11 DIAGNOSIS — Z9889 Other specified postprocedural states: Secondary | ICD-10-CM | POA: Diagnosis not present

## 2020-05-11 DIAGNOSIS — Z23 Encounter for immunization: Secondary | ICD-10-CM | POA: Diagnosis not present

## 2020-05-11 DIAGNOSIS — I4891 Unspecified atrial fibrillation: Secondary | ICD-10-CM | POA: Diagnosis not present

## 2020-05-11 DIAGNOSIS — R0602 Shortness of breath: Secondary | ICD-10-CM | POA: Diagnosis not present

## 2020-05-13 DIAGNOSIS — M6281 Muscle weakness (generalized): Secondary | ICD-10-CM | POA: Diagnosis not present

## 2020-05-13 DIAGNOSIS — M25512 Pain in left shoulder: Secondary | ICD-10-CM | POA: Diagnosis not present

## 2020-05-13 DIAGNOSIS — M25612 Stiffness of left shoulder, not elsewhere classified: Secondary | ICD-10-CM | POA: Diagnosis not present

## 2020-05-13 DIAGNOSIS — Z9889 Other specified postprocedural states: Secondary | ICD-10-CM | POA: Diagnosis not present

## 2020-05-14 ENCOUNTER — Other Ambulatory Visit
Admission: RE | Admit: 2020-05-14 | Discharge: 2020-05-14 | Disposition: A | Discharge status: Home / Self Care | Payer: PPO | Source: Ambulatory Visit | Attending: Vascular Surgery | Admitting: Vascular Surgery

## 2020-05-14 ENCOUNTER — Other Ambulatory Visit: Payer: Self-pay

## 2020-05-14 DIAGNOSIS — Z01812 Encounter for preprocedural laboratory examination: Secondary | ICD-10-CM | POA: Diagnosis not present

## 2020-05-14 DIAGNOSIS — Z20822 Contact with and (suspected) exposure to covid-19: Secondary | ICD-10-CM | POA: Diagnosis not present

## 2020-05-14 LAB — SARS CORONAVIRUS 2 (TAT 6-24 HRS): SARS Coronavirus 2: NEGATIVE

## 2020-05-18 ENCOUNTER — Ambulatory Visit: Payer: PPO | Admitting: Anesthesiology

## 2020-05-18 ENCOUNTER — Ambulatory Visit
Admission: RE | Admit: 2020-05-18 | Discharge: 2020-05-18 | Disposition: A | Discharge status: Home / Self Care | Payer: PPO | Source: Ambulatory Visit | Attending: Internal Medicine | Admitting: Internal Medicine

## 2020-05-18 ENCOUNTER — Encounter: Payer: Self-pay | Admitting: Internal Medicine

## 2020-05-18 ENCOUNTER — Encounter
Admission: RE | Disposition: A | Discharge status: Home / Self Care | Payer: Self-pay | Source: Ambulatory Visit | Attending: Internal Medicine

## 2020-05-18 DIAGNOSIS — I4891 Unspecified atrial fibrillation: Secondary | ICD-10-CM | POA: Insufficient documentation

## 2020-05-18 DIAGNOSIS — I499 Cardiac arrhythmia, unspecified: Secondary | ICD-10-CM | POA: Diagnosis not present

## 2020-05-18 DIAGNOSIS — Z8673 Personal history of transient ischemic attack (TIA), and cerebral infarction without residual deficits: Secondary | ICD-10-CM | POA: Diagnosis not present

## 2020-05-18 DIAGNOSIS — I10 Essential (primary) hypertension: Secondary | ICD-10-CM | POA: Diagnosis not present

## 2020-05-18 DIAGNOSIS — R001 Bradycardia, unspecified: Secondary | ICD-10-CM | POA: Diagnosis not present

## 2020-05-18 DIAGNOSIS — E119 Type 2 diabetes mellitus without complications: Secondary | ICD-10-CM | POA: Diagnosis not present

## 2020-05-18 HISTORY — PX: CARDIOVERSION: SHX1299

## 2020-05-18 SURGERY — CARDIOVERSION
Anesthesia: General

## 2020-05-18 MED ORDER — PROPOFOL 10 MG/ML IV BOLUS
INTRAVENOUS | Status: DC | PRN
  Administered 2020-05-18: 60 mg via INTRAVENOUS

## 2020-05-18 MED ORDER — PROPOFOL 10 MG/ML IV BOLUS
INTRAVENOUS | Status: AC
  Filled 2020-05-18: qty 20

## 2020-05-18 MED ORDER — LIDOCAINE HCL (CARDIAC) PF 100 MG/5ML IV SOSY
PREFILLED_SYRINGE | INTRAVENOUS | Status: DC | PRN
  Administered 2020-05-18: 40 mg via INTRAVENOUS

## 2020-05-18 NOTE — CV Procedure (Signed)
Electrical Cardioversion Procedure Note   Procedure: Electrical Cardioversion Indications:  Atrial Fibrillation  Procedure Details Consent: Risks of procedure as well as the alternatives and risks of each were explained to the (patient/caregiver).  Consent for procedure obtained. Time Out: Verified patient identification, verified procedure, site/side was marked, verified correct patient position, special equipment/implants available, medications/allergies/relevent history reviewed, required imaging and test results available.  Performed  Patient placed on cardiac monitor, pulse oximetry, supplemental oxygen as necessary.  Sedation given: As per anesthesia probably propofol Pacer pads placed anterior and posterior chest.  Cardioverted 1 time(s).  Cardioverted at [place].  Evaluation Findings: Post procedure EKG shows: NSR Complications: None Patient did tolerate procedure well.   Lujean Amel MD 05/18/2020 , 07:40 AM

## 2020-05-18 NOTE — Anesthesia Postprocedure Evaluation (Signed)
Anesthesia Post Note  Patient: Timothy Wells  Procedure(s) Performed: CARDIOVERSION (N/A )  Patient location during evaluation: Specials Recovery Anesthesia Type: General Level of consciousness: awake and alert Pain management: pain level controlled Vital Signs Assessment: post-procedure vital signs reviewed and stable Respiratory status: spontaneous breathing, nonlabored ventilation, respiratory function stable and patient connected to nasal cannula oxygen Cardiovascular status: blood pressure returned to baseline and stable Postop Assessment: no apparent nausea or vomiting Anesthetic complications: no   No complications documented.   Last Vitals:  Vitals:   05/18/20 0756 05/18/20 0800  BP: 101/68 96/65  Pulse: 63 63  Resp: 11 17  Temp:    SpO2: 95% 97%    Last Pain:  Vitals:   05/18/20 0800  TempSrc:   PainSc: Asleep                 Arita Miss

## 2020-05-18 NOTE — Anesthesia Procedure Notes (Signed)
Date/Time: 05/18/2020 7:25 AM Performed by: Allean Found, CRNA Pre-anesthesia Checklist: Patient identified, Emergency Drugs available, Suction available, Patient being monitored and Timeout performed Patient Re-evaluated:Patient Re-evaluated prior to induction Oxygen Delivery Method: Nasal cannula Placement Confirmation: positive ETCO2

## 2020-05-18 NOTE — Transfer of Care (Signed)
Immediate Anesthesia Transfer of Care Note  Patient: Timothy Wells  Procedure(s) Performed: CARDIOVERSION (N/A )  Patient Location: PACU  Anesthesia Type:General  Level of Consciousness: sedated  Airway & Oxygen Therapy: Patient Spontanous Breathing and Patient connected to nasal cannula oxygen  Post-op Assessment: Report given to RN and Post -op Vital signs reviewed and stable  Post vital signs: Reviewed and stable  Last Vitals:  Vitals Value Taken Time  BP    Temp    Pulse    Resp    SpO2      Last Pain:  Vitals:   05/18/20 0659  TempSrc: Oral  PainSc: 0-No pain         Complications: No complications documented.

## 2020-05-18 NOTE — Anesthesia Preprocedure Evaluation (Addendum)
Anesthesia Evaluation  Patient identified by MRN, date of birth, ID band Patient awake    Reviewed: Allergy & Precautions, NPO status , Patient's Chart, lab work & pertinent test results  History of Anesthesia Complications Negative for: history of anesthetic complications  Airway Mallampati: II  TM Distance: >3 FB Neck ROM: Full    Dental  (+) Partial Upper   Pulmonary neg pulmonary ROS, neg sleep apnea, neg COPD, Patient abstained from smoking.Not current smoker,    Pulmonary exam normal breath sounds clear to auscultation       Cardiovascular Exercise Tolerance: Good METShypertension, (-) CAD and (-) Past MI + dysrhythmias Atrial Fibrillation  Rhythm:Irregular Rate:Normal - Systolic murmurs TTE 9379: 1. Left ventricular ejection fraction, by visual estimation, is 60 to  65%. The left ventricle has normal function. Left ventricular septal wall  thickness was moderately increased. Moderately increased left ventricular  posterior wall thickness. There is  mildly increased left ventricular hypertrophy.  2. Global right ventricle has normal systolic function.The right  ventricular size is normal. No increase in right ventricular wall  thickness.  3. Left atrial size was mildly dilated.  4. Right atrial size was normal.  5. The mitral valve is grossly normal. Trace mitral valve regurgitation.  6. The tricuspid valve is grossly normal. Tricuspid valve regurgitation  is trivial.  7. The aortic valve is tricuspid. Aortic valve regurgitation is not  visualized.  8. The pulmonic valve was not well visualized. Pulmonic valve  regurgitation is trivial.  9. The atrial septum is grossly normal.    Neuro/Psych TIAnegative psych ROS   GI/Hepatic neg GERD  ,(+)     (-) substance abuse  ,   Endo/Other  diabetes  Renal/GU negative Renal ROS     Musculoskeletal   Abdominal   Peds  Hematology   Anesthesia Other  Findings Past Medical History: No date: A-fib (HCC) No date: Anemia No date: Diabetes mellitus without complication (HCC)     Comment:  type 2 No date: Dysrhythmia     Comment:  wide complex tachycardia No date: Edema of both lower legs No date: Memory loss No date: Restless leg syndrome No date: Skin cancer     Comment:  skin cancer face basal cell 2019: Stroke ([place])  Reproductive/Obstetrics                           Anesthesia Physical Anesthesia Plan  ASA: III  Anesthesia Plan: General   Post-op Pain Management:    Induction: Intravenous  PONV Risk Score and Plan: 2 and Ondansetron, Propofol infusion and TIVA  Airway Management Planned: Nasal Cannula  Additional Equipment: None  Intra-op Plan:   Post-operative Plan:   Informed Consent: I have reviewed the patients History and Physical, chart, labs and discussed the procedure including the risks, benefits and alternatives for the proposed anesthesia with the patient or authorized representative who has indicated his/her understanding and acceptance.     Dental advisory given  Plan Discussed with: CRNA and Surgeon  Anesthesia Plan Comments: (Discussed risks of anesthesia with patient, including possibility of difficulty with spontaneous ventilation under anesthesia necessitating airway intervention, PONV, and rare risks such as cardiac or respiratory or neurological events. Patient understands.)        Anesthesia Quick Evaluation

## 2020-05-20 DIAGNOSIS — M6281 Muscle weakness (generalized): Secondary | ICD-10-CM | POA: Diagnosis not present

## 2020-05-20 DIAGNOSIS — M25612 Stiffness of left shoulder, not elsewhere classified: Secondary | ICD-10-CM | POA: Diagnosis not present

## 2020-05-20 DIAGNOSIS — M25512 Pain in left shoulder: Secondary | ICD-10-CM | POA: Diagnosis not present

## 2020-05-20 DIAGNOSIS — Z9889 Other specified postprocedural states: Secondary | ICD-10-CM | POA: Diagnosis not present

## 2020-05-24 DIAGNOSIS — M25512 Pain in left shoulder: Secondary | ICD-10-CM | POA: Diagnosis not present

## 2020-05-24 DIAGNOSIS — M6281 Muscle weakness (generalized): Secondary | ICD-10-CM | POA: Diagnosis not present

## 2020-05-24 DIAGNOSIS — M25612 Stiffness of left shoulder, not elsewhere classified: Secondary | ICD-10-CM | POA: Diagnosis not present

## 2020-05-24 DIAGNOSIS — Z9889 Other specified postprocedural states: Secondary | ICD-10-CM | POA: Diagnosis not present

## 2020-05-26 ENCOUNTER — Ambulatory Visit: Payer: PPO | Admitting: Cardiology

## 2020-05-27 DIAGNOSIS — M6281 Muscle weakness (generalized): Secondary | ICD-10-CM | POA: Diagnosis not present

## 2020-05-27 DIAGNOSIS — M25612 Stiffness of left shoulder, not elsewhere classified: Secondary | ICD-10-CM | POA: Diagnosis not present

## 2020-05-27 DIAGNOSIS — Z9889 Other specified postprocedural states: Secondary | ICD-10-CM | POA: Diagnosis not present

## 2020-05-27 DIAGNOSIS — M25512 Pain in left shoulder: Secondary | ICD-10-CM | POA: Diagnosis not present

## 2020-06-01 DIAGNOSIS — Z9889 Other specified postprocedural states: Secondary | ICD-10-CM | POA: Diagnosis not present

## 2020-06-01 DIAGNOSIS — M6281 Muscle weakness (generalized): Secondary | ICD-10-CM | POA: Diagnosis not present

## 2020-06-01 DIAGNOSIS — M25512 Pain in left shoulder: Secondary | ICD-10-CM | POA: Diagnosis not present

## 2020-06-01 DIAGNOSIS — M25612 Stiffness of left shoulder, not elsewhere classified: Secondary | ICD-10-CM | POA: Diagnosis not present

## 2020-06-01 DIAGNOSIS — M75122 Complete rotator cuff tear or rupture of left shoulder, not specified as traumatic: Secondary | ICD-10-CM | POA: Diagnosis not present

## 2020-06-01 IMAGING — CR DG CHEST 2V
2 series · 2 of 2 positions shown · non-contrast
Comparison: 01/21/2019

CLINICAL DATA: Chest pain. Vaccine earlier today. Atrial
fibrillation.

EXAM:
CHEST - 2 VIEW

[chest pa]
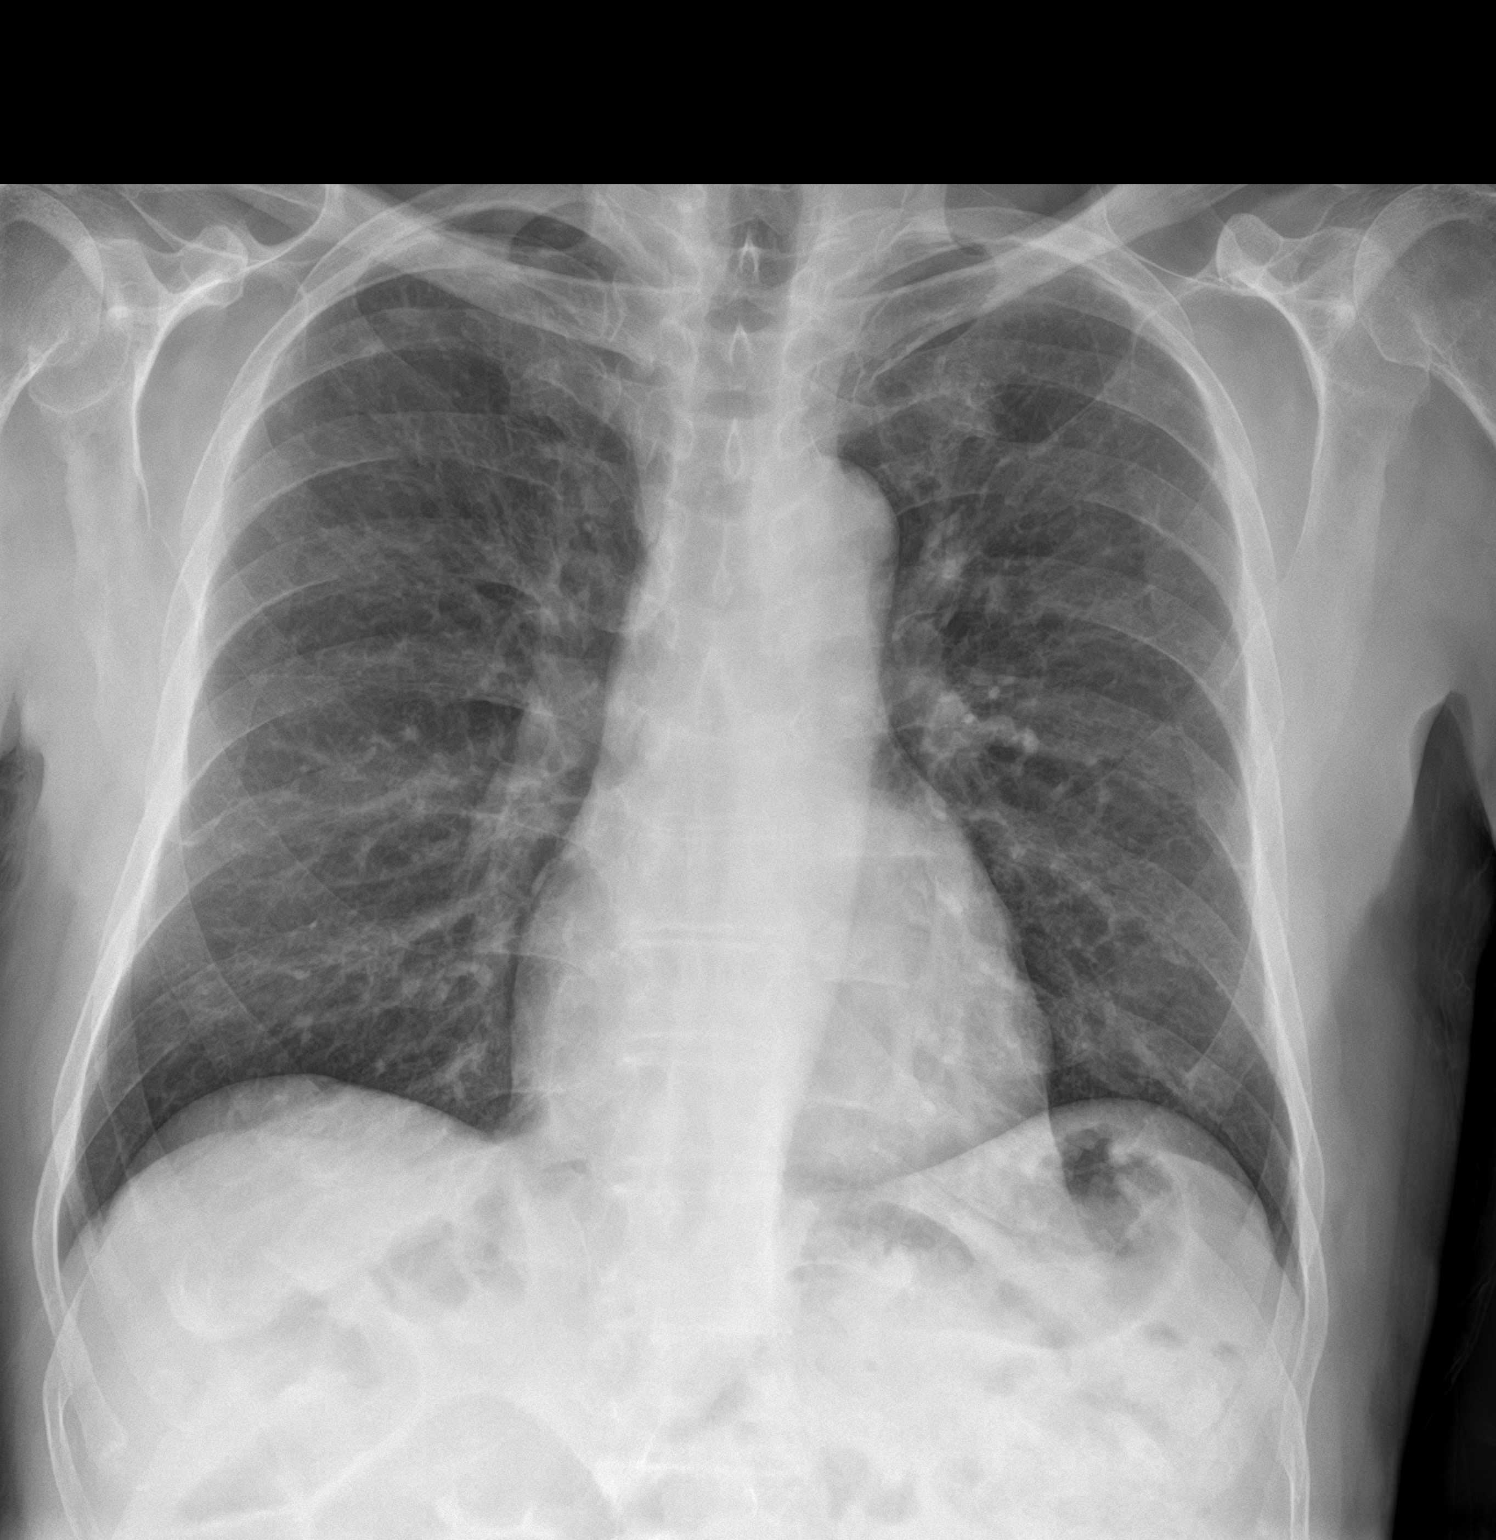

[chest lat]
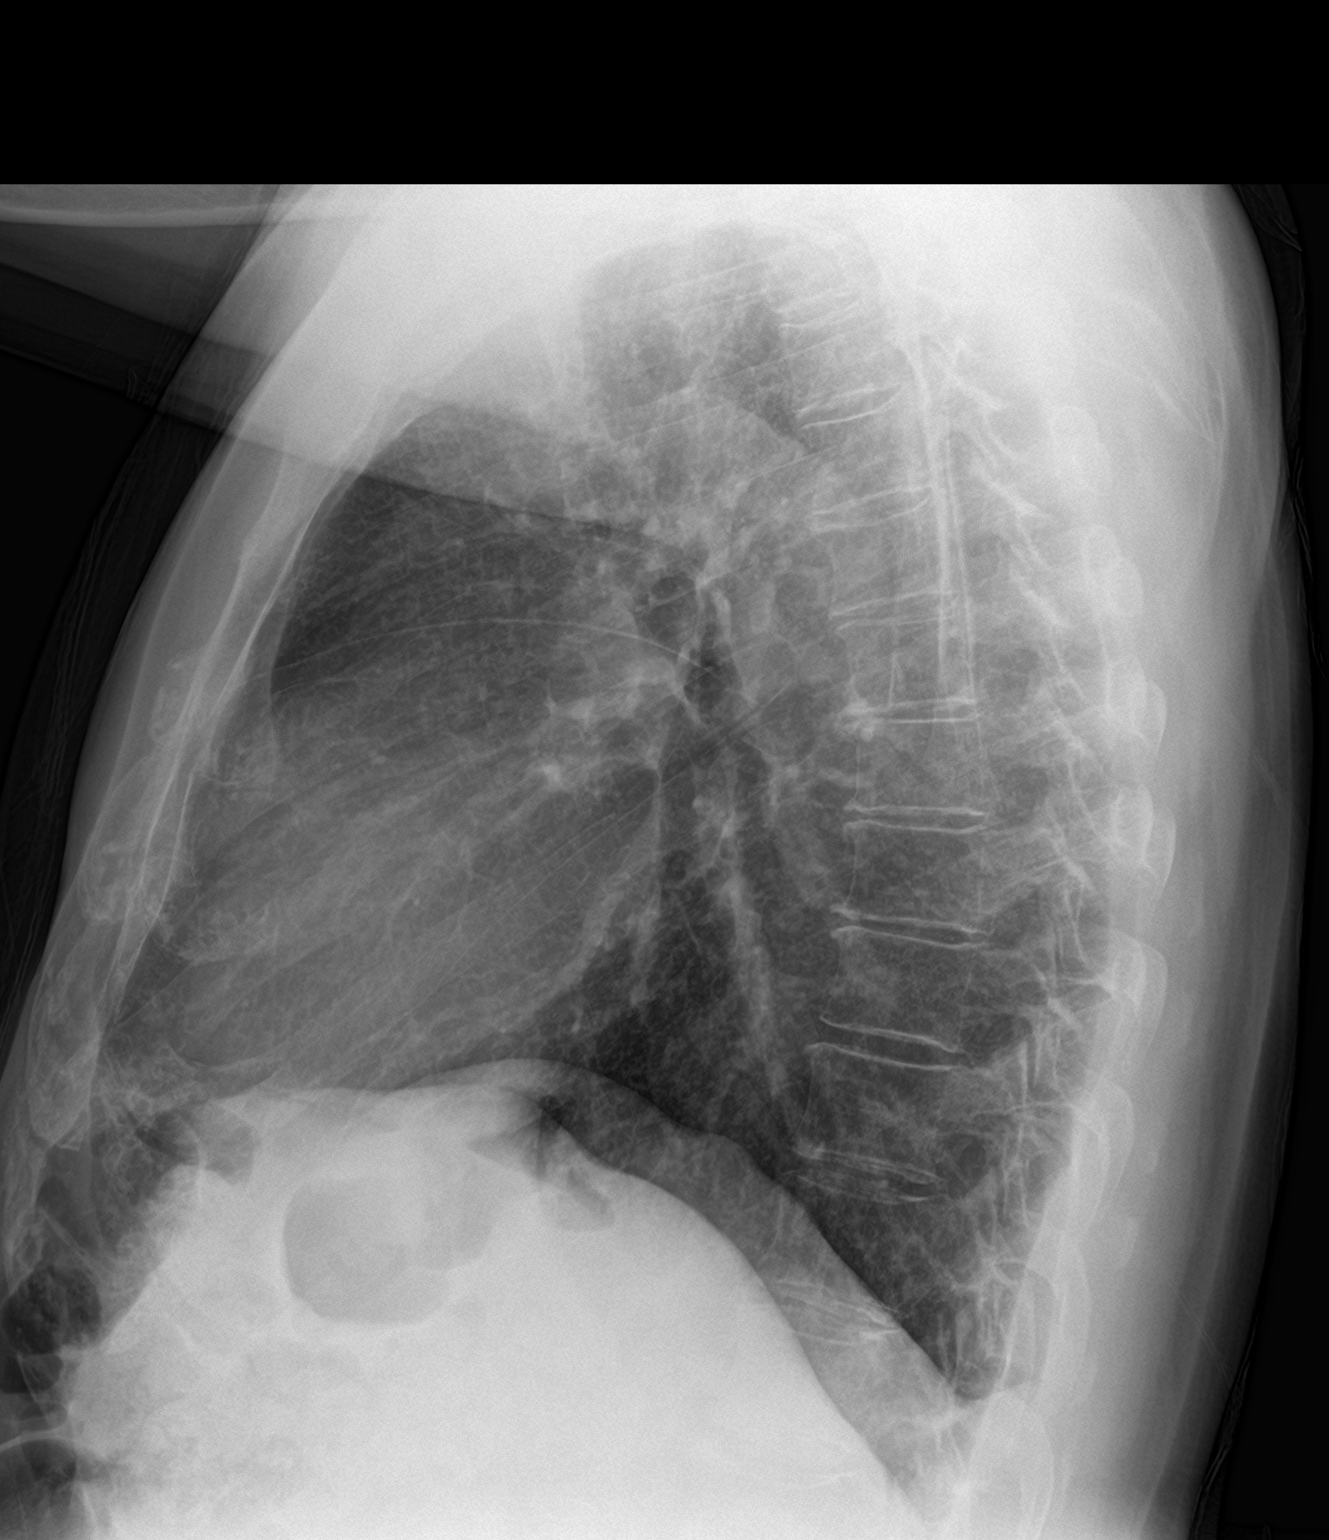

[2 of 2 positions shown; findings below may reference images not displayed]

FINDINGS: Lateral view degraded by patient arm position. Midline trachea.
Normal heart size and mediastinal contours. No pleural effusion or
pneumothorax. Clear lungs.
IMPRESSION: No acute cardiopulmonary disease.

## 2020-06-02 DIAGNOSIS — R0602 Shortness of breath: Secondary | ICD-10-CM | POA: Diagnosis not present

## 2020-06-02 DIAGNOSIS — I4891 Unspecified atrial fibrillation: Secondary | ICD-10-CM | POA: Diagnosis not present

## 2020-06-02 DIAGNOSIS — I480 Paroxysmal atrial fibrillation: Secondary | ICD-10-CM | POA: Diagnosis not present

## 2020-06-02 DIAGNOSIS — R000 Tachycardia, unspecified: Secondary | ICD-10-CM | POA: Diagnosis not present

## 2020-06-02 DIAGNOSIS — E782 Mixed hyperlipidemia: Secondary | ICD-10-CM | POA: Diagnosis not present

## 2020-06-02 DIAGNOSIS — Z9889 Other specified postprocedural states: Secondary | ICD-10-CM | POA: Diagnosis not present

## 2020-06-02 DIAGNOSIS — R002 Palpitations: Secondary | ICD-10-CM | POA: Diagnosis not present

## 2020-06-02 DIAGNOSIS — I10 Essential (primary) hypertension: Secondary | ICD-10-CM | POA: Diagnosis not present

## 2020-06-02 DIAGNOSIS — Z8673 Personal history of transient ischemic attack (TIA), and cerebral infarction without residual deficits: Secondary | ICD-10-CM | POA: Diagnosis not present

## 2020-06-02 DIAGNOSIS — R600 Localized edema: Secondary | ICD-10-CM | POA: Diagnosis not present

## 2020-06-03 DIAGNOSIS — Z9889 Other specified postprocedural states: Secondary | ICD-10-CM | POA: Diagnosis not present

## 2020-06-03 DIAGNOSIS — M6281 Muscle weakness (generalized): Secondary | ICD-10-CM | POA: Diagnosis not present

## 2020-06-03 DIAGNOSIS — M25612 Stiffness of left shoulder, not elsewhere classified: Secondary | ICD-10-CM | POA: Diagnosis not present

## 2020-06-03 DIAGNOSIS — M25512 Pain in left shoulder: Secondary | ICD-10-CM | POA: Diagnosis not present

## 2020-06-04 DIAGNOSIS — G2581 Restless legs syndrome: Secondary | ICD-10-CM | POA: Diagnosis not present

## 2020-06-04 DIAGNOSIS — I480 Paroxysmal atrial fibrillation: Secondary | ICD-10-CM | POA: Diagnosis not present

## 2020-06-04 DIAGNOSIS — I472 Ventricular tachycardia: Secondary | ICD-10-CM | POA: Diagnosis not present

## 2020-06-04 DIAGNOSIS — E119 Type 2 diabetes mellitus without complications: Secondary | ICD-10-CM | POA: Diagnosis not present

## 2020-06-04 DIAGNOSIS — Z8673 Personal history of transient ischemic attack (TIA), and cerebral infarction without residual deficits: Secondary | ICD-10-CM | POA: Diagnosis not present

## 2020-06-04 DIAGNOSIS — R413 Other amnesia: Secondary | ICD-10-CM | POA: Diagnosis not present

## 2020-06-04 DIAGNOSIS — R030 Elevated blood-pressure reading, without diagnosis of hypertension: Secondary | ICD-10-CM | POA: Diagnosis not present

## 2020-06-04 DIAGNOSIS — E559 Vitamin D deficiency, unspecified: Secondary | ICD-10-CM | POA: Diagnosis not present

## 2020-06-04 DIAGNOSIS — N400 Enlarged prostate without lower urinary tract symptoms: Secondary | ICD-10-CM | POA: Diagnosis not present

## 2020-06-04 DIAGNOSIS — Z0000 Encounter for general adult medical examination without abnormal findings: Secondary | ICD-10-CM | POA: Diagnosis not present

## 2020-06-04 DIAGNOSIS — E7849 Other hyperlipidemia: Secondary | ICD-10-CM | POA: Diagnosis not present

## 2020-06-04 DIAGNOSIS — L570 Actinic keratosis: Secondary | ICD-10-CM | POA: Diagnosis not present

## 2020-06-04 DIAGNOSIS — D649 Anemia, unspecified: Secondary | ICD-10-CM | POA: Diagnosis not present

## 2020-06-04 DIAGNOSIS — Z1329 Encounter for screening for other suspected endocrine disorder: Secondary | ICD-10-CM | POA: Diagnosis not present

## 2020-06-08 DIAGNOSIS — M6281 Muscle weakness (generalized): Secondary | ICD-10-CM | POA: Diagnosis not present

## 2020-06-08 DIAGNOSIS — Z9889 Other specified postprocedural states: Secondary | ICD-10-CM | POA: Diagnosis not present

## 2020-06-08 DIAGNOSIS — M25612 Stiffness of left shoulder, not elsewhere classified: Secondary | ICD-10-CM | POA: Diagnosis not present

## 2020-06-08 DIAGNOSIS — M25512 Pain in left shoulder: Secondary | ICD-10-CM | POA: Diagnosis not present

## 2020-06-10 DIAGNOSIS — Z9889 Other specified postprocedural states: Secondary | ICD-10-CM | POA: Diagnosis not present

## 2020-06-10 DIAGNOSIS — M25612 Stiffness of left shoulder, not elsewhere classified: Secondary | ICD-10-CM | POA: Diagnosis not present

## 2020-06-10 DIAGNOSIS — M6281 Muscle weakness (generalized): Secondary | ICD-10-CM | POA: Diagnosis not present

## 2020-06-10 DIAGNOSIS — M25512 Pain in left shoulder: Secondary | ICD-10-CM | POA: Diagnosis not present

## 2020-06-15 DIAGNOSIS — M75122 Complete rotator cuff tear or rupture of left shoulder, not specified as traumatic: Secondary | ICD-10-CM | POA: Diagnosis not present

## 2020-06-15 DIAGNOSIS — M25512 Pain in left shoulder: Secondary | ICD-10-CM | POA: Diagnosis not present

## 2020-06-15 DIAGNOSIS — M25612 Stiffness of left shoulder, not elsewhere classified: Secondary | ICD-10-CM | POA: Diagnosis not present

## 2020-06-15 DIAGNOSIS — Z9889 Other specified postprocedural states: Secondary | ICD-10-CM | POA: Diagnosis not present

## 2020-06-15 DIAGNOSIS — M6281 Muscle weakness (generalized): Secondary | ICD-10-CM | POA: Diagnosis not present

## 2020-06-16 ENCOUNTER — Ambulatory Visit: Payer: PPO | Admitting: Cardiology

## 2020-06-16 ENCOUNTER — Other Ambulatory Visit: Payer: Self-pay

## 2020-06-16 ENCOUNTER — Encounter: Payer: Self-pay | Admitting: Cardiology

## 2020-06-16 VITALS — BP 110/60 | HR 58 | Ht 67.0 in | Wt 169.0 lb

## 2020-06-16 DIAGNOSIS — I4819 Other persistent atrial fibrillation: Secondary | ICD-10-CM | POA: Diagnosis not present

## 2020-06-16 DIAGNOSIS — I10 Essential (primary) hypertension: Secondary | ICD-10-CM | POA: Diagnosis not present

## 2020-06-16 NOTE — Patient Instructions (Addendum)
Medication Instructions:  Your physician recommends that you continue on your current medications as directed. Please refer to the Current Medication list given to you today. *If you need a refill on your cardiac medications before your next appointment, please call your pharmacy*  Lab Work: You will get lab work the same day as your ECHO at [facility].  If you have labs (blood work) drawn today and your tests are completely normal, you will receive your results only by:  [place] (if you have MyChart) OR  A paper copy in the mail If you have any lab test that is abnormal or we need to change your treatment, we will call you to review the results.  Testing/Procedures: Your physician has requested that you have an echocardiogram. Echocardiography is a painless test that uses sound waves to create images of your heart. It provides your doctor with information about the size and shape of your heart and how well your heart's chambers and valves are working. This procedure takes approximately one hour. There are no restrictions for this procedure.  Please schedule for ECHO  Follow-Up: At [facility], you and your health needs are our priority. As part of our continuing mission to provide you with exceptional heart care, we have created designated Provider Care Teams. These Care Teams include your primary Cardiologist (physician) and Advanced Practice Providers (APPs -  Physician Assistants and Nurse Practitioners) who all work together to provide you with the care you need, when you need it.  Your next appointment:    You will get a phone call from [facility] to schedule your follow up visits.  SEE INSTRUCTION LETTER   Cardiac electrophysiology: From cell to bedside (7th ed., pp. 4818-5631). [place], [place]: Elsevier.">  Cardiac Ablation Cardiac ablation is a procedure to destroy, or ablate, a small amount of heart tissue in very specific places. The heart has many  electrical connections. Sometimes these connections are abnormal and can cause the heart to beat very fast or irregularly. Ablating some of the areas that cause problems can improve the heart's rhythm or return it to normal. Ablation may be done for people who:  Have Wolff-Parkinson-White syndrome.  Have fast heart rhythms (tachycardia).  Have taken medicines for an abnormal heart rhythm (arrhythmia) that were not effective or caused side effects.  Have a high-risk heartbeat that may be life-threatening. During the procedure, a small incision is made in the neck or the groin, and a long, thin tube (catheter) is inserted into the incision and moved to the heart. Small devices (electrodes) on the tip of the catheter will send out electrical currents. A type of X-ray (fluoroscopy) will be used to help guide the catheter and to provide images of the heart. Tell a health care provider about:  Any allergies you have.  All medicines you are taking, including vitamins, herbs, eye drops, creams, and over-the-counter medicines.  Any problems you or family members have had with anesthetic medicines.  Any blood disorders you have.  Any surgeries you have had.  Any medical conditions you have, such as kidney failure.  Whether you are pregnant or may be pregnant. What are the risks? Generally, this is a safe procedure. However, problems may occur, including:  Infection.  Bruising and bleeding at the catheter insertion site.  Bleeding into the chest, especially into the sac that surrounds the heart. This is a serious complication.  Stroke or blood clots.  Damage to nearby structures or organs.  Allergic reaction to medicines  or dyes.  Need for a permanent pacemaker if the normal electrical system is damaged. A pacemaker is a small computer that sends electrical signals to the heart and helps your heart beat normally.  The procedure not being fully effective. This may not be recognized  until months later. Repeat ablation procedures are sometimes done. What happens before the procedure? Medicines Ask your health care provider about:  Changing or stopping your regular medicines. This is especially important if you are taking diabetes medicines or blood thinners.  Taking medicines such as aspirin and ibuprofen. These medicines can thin your blood. Do not take these medicines unless your health care provider tells you to take them.  Taking over-the-counter medicines, vitamins, herbs, and supplements. General instructions  Follow instructions from your health care provider about eating or drinking restrictions.  Plan to have someone take you home from the hospital or clinic.  If you will be going home right after the procedure, plan to have someone with you for 24 hours.  Ask your health care provider what steps will be taken to prevent infection. What happens during the procedure?  An IV will be inserted into one of your veins.  You will be given a medicine to help you relax (sedative).  The skin on your neck or groin will be numbed.  An incision will be made in your neck or your groin.  A needle will be inserted through the incision and into a large vein in your neck or groin.  A catheter will be inserted into the needle and moved to your heart.  Dye may be injected through the catheter to help your surgeon see the area of the heart that needs treatment.  Electrical currents will be sent from the catheter to ablate heart tissue in desired areas. There are three types of energy that may be used to do this: ? Heat (radiofrequency energy). ? Laser energy. ? Extreme cold (cryoablation).  When the tissue has been ablated, the catheter will be removed.  Pressure will be held on the insertion area to prevent a lot of bleeding.  A bandage (dressing) will be placed over the insertion area. The exact procedure may vary among health care providers and hospitals.    What happens after the procedure?  Your blood pressure, heart rate, breathing rate, and blood oxygen level will be monitored until you leave the hospital or clinic.  Your insertion area will be monitored for bleeding. You will need to lie still for a few hours to ensure that you do not bleed from the insertion area.  Do not drive for 24 hours or as long as told by your health care provider. Summary  Cardiac ablation is a procedure to destroy, or ablate, a small amount of heart tissue using an electrical current. This procedure can improve the heart rhythm or return it to normal.  Tell your health care provider about any medical conditions you may have and all medicines you are taking to treat them.  This is a safe procedure, but problems may occur. Problems may include infection, bruising, damage to nearby organs or structures, or allergic reactions to medicines.  Follow your health care provider's instructions about eating and drinking before the procedure. You may also be told to change or stop some of your medicines.  After the procedure, do not drive for 24 hours or as long as told by your health care provider. This information is not intended to replace advice given to you by your health  care provider. Make sure you discuss any questions you have with your health care provider. Document Revised: 12/23/2018 Document Reviewed: 12/23/2018 Elsevier Patient Education  [place].

## 2020-06-16 NOTE — Progress Notes (Signed)
Electrophysiology Office Note:    Date:  06/16/2020   ID:  Timothy Wells, DOB 10-Aug-1950, MRN [id number]  PCP:  Toni Arthurs, NP  [facility] Cardiologist:  No primary care provider on file.  [facility] Electrophysiologist:  Vickie Epley, MD   Referring MD: Toni Arthurs, NP   Chief Complaint: Atrial fibrillation  History of Present Illness:    Timothy Wells is a 70 y.o. male who presents for an evaluation of atrial fibrillation at the request Dr Clayborn Bigness. Their medical history includes atrial fibrillation, diabetes, stroke, anemia, memory loss, bradycardia, hyperlipidemia.  He is on Eliquis for stroke prophylaxis.  He takes amiodarone.  He last saw Dr. Clayborn Bigness on June 02, 2020.  He had previously undergone a cardioversion on May 18, 2020.  He is symptomatic with his atrial fibrillation.  The first time it happened he was felt like he was in a pass out with heart rates near 200 bpm.  He has done well since starting amiodarone.  Past Medical History:  Diagnosis Date  . A-fib (Timothy Wells)   . Anemia   . Diabetes mellitus without complication ([place])    type 2  . Dysrhythmia    wide complex tachycardia  . Edema of both lower legs   . Memory loss   . Restless leg syndrome   . Skin cancer    skin cancer face basal cell  . Stroke [facility]) 2019    Past Surgical History:  Procedure Laterality Date  . CARDIOVERSION N/A 03/25/2019   Procedure: CARDIOVERSION;  Surgeon: Yolonda Kida, MD;  Location: [facility] ORS;  Service: Cardiovascular;  Laterality: N/A;  . CARDIOVERSION N/A 05/18/2020   Procedure: CARDIOVERSION;  Surgeon: Yolonda Kida, MD;  Location: [facility] ORS;  Service: Cardiovascular;  Laterality: N/A;  . COLONOSCOPY WITH PROPOFOL N/A 09/10/2019   Procedure: COLONOSCOPY WITH PROPOFOL;  Surgeon: Robert Bellow, MD;  Location: [facility] ENDOSCOPY;  Service: Endoscopy;  Laterality: N/A;  . SHOULDER ARTHROSCOPY WITH SUBACROMIAL DECOMPRESSION AND OPEN ROTATOR C Left  03/08/2020   Procedure: Left shoulder arthroscopic subscapularis repair and biodegradable balloon spacer placement with partial infraspinatus repair;  Surgeon: Leim Fabry, MD;  Location: [place];  Service: Orthopedics;  Laterality: Left;    Current Medications: Current Meds  Medication Sig  . acetaminophen (TYLENOL) 500 MG tablet Take 1,000 mg by mouth every 6 (six) hours as needed for mild pain or headache.  Marland Kitchen amiodarone (PACERONE) 400 MG tablet Take 1 tablet (400 mg total) by mouth 2 (two) times daily. (Patient taking differently: Take 200 mg by mouth daily.)  . apixaban (ELIQUIS) 5 MG TABS tablet Take 1 tablet (5 mg total) by mouth 2 (two) times daily.  . Ascorbic Acid (VITAMIN C WITH ROSE HIPS) 1000 MG tablet Take 1,000 mg by mouth daily.  Marland Kitchen atenolol (TENORMIN) 25 MG tablet Take 25 mg by mouth daily.  Marland Kitchen atorvastatin (LIPITOR) 80 MG tablet Take 80 mg by mouth every evening.   . Cholecalciferol (VITAMIN D) 50 MCG (2000 UT) CAPS Take 4,000 Units by mouth daily.  . Chromium-Cinnamon (CINNAMON PLUS CHROMIUM PO) Take 1 tablet by mouth 2 (two) times daily.  Marland Kitchen CRANBERRY EXTRACT PO Take 1 tablet by mouth daily.  Marland Kitchen diltiazem (TIAZAC) 360 MG 24 hr capsule Take 360 mg by mouth daily.  . Ginkgo Biloba 60 MG TABS Take 60 mg by mouth daily.  . Glucosamine-Chondroitin (COSAMIN DS PO) Take 1 tablet by mouth 2 (two) times daily.  Marland Kitchen lisinopril (ZESTRIL) 10 MG  tablet Take 10 mg by mouth daily.  . magnesium oxide (MAG-OX) 400 (241.3 Mg) MG tablet Take 1 tablet (400 mg total) by mouth daily.  . Multiple Vitamin (MULTIVITAMIN WITH MINERALS) TABS tablet Take 1 tablet by mouth daily.  . Omega-3 Fatty Acids (FISH OIL) 1000 MG CAPS Take 1,000 mg by mouth daily.  Marland Kitchen omeprazole (PRILOSEC) 20 MG capsule Take 20 mg by mouth daily.  Marland Kitchen rOPINIRole (REQUIP) 1 MG tablet Take 1 mg by mouth at bedtime.  . tamsulosin (FLOMAX) 0.4 MG CAPS capsule Take 0.4 mg by mouth at bedtime.  . Turmeric Curcumin 500 MG CAPS  Take 500 mg by mouth 2 (two) times daily.  . vitamin B-12 (CYANOCOBALAMIN) 1000 MCG tablet Take 1,000 mcg by mouth daily.  . vitamin E 400 UNIT capsule Take 400 Units by mouth daily.  . Zinc 50 MG TABS Take 50 mg by mouth daily.  . [DISCONTINUED] B Complex-Biotin-FA (SUPER B-COMPLEX PO) Take 1 tablet by mouth daily.     Allergies:   Patient has no known allergies.   Social History   Socioeconomic History  . Marital status: Single    Spouse name: Not on file  . Number of children: Not on file  . Years of education: Not on file  . Highest education level: Not on file  Occupational History  . Not on file  Tobacco Use  . Smoking status: Never Smoker  . Smokeless tobacco: Never Used  Vaping Use  . Vaping Use: Never used  Substance and Sexual Activity  . Alcohol use: Yes    Comment: rare  . Drug use: Not Currently  . Sexual activity: Not on file  Other Topics Concern  . Not on file  Social History Narrative  . Not on file   Social Determinants of Health   Financial Resource Strain: Not on file  Food Insecurity: Not on file  Transportation Needs: Not on file  Physical Activity: Not on file  Stress: Not on file  Social Connections: Not on file     Family History: The patient's family history is not on file.  ROS:   Please see the history of present illness.    All other systems reviewed and are negative.  EKGs/Labs/Other Studies Reviewed:    The following studies were reviewed today:  January 22, 2019 echo Left ventricular function normal, 60% Mild LVH Mildly dilated left atrium Trace MR Trivial TR  May 16, 2019 SPECT stress at [facility] FINDINGS:  Regional wall motion: reveals normal myocardial thickening and wall  motion.  The overall quality of the study is excellent.   Artifacts noted: no  Left ventricularcavity: normal.  Perfusion Analysis: SPECT images demonstrate homogeneous tracer  distribution throughout the myocardium. Defect type:  Normal   EKG:  The ekg ordered today demonstrates sinus rhythm  Recent Labs: 03/04/2020: BUN 24; Creatinine, Ser 0.86; Hemoglobin 12.4; Platelets 249; Potassium 4.5; Sodium 138  Recent Lipid Panel No results found for: CHOL, TRIG, HDL, CHOLHDL, VLDL, LDLCALC, LDLDIRECT  Physical Exam:    VS:  BP 110/60   Pulse (!) 58   Ht 5\' 7"  (1.702 m)   Wt 169 lb (76.7 kg)   SpO2 96%   BMI 26.47 kg/m     Wt Readings from Last 3 Encounters:  06/16/20 169 lb (76.7 kg)  05/18/20 170 lb (77.1 kg)  03/08/20 163 lb (73.9 kg)     GEN:  Well nourished, well developed in no acute distress HEENT: Normal NECK: No JVD; No  carotid bruits LYMPHATICS: No lymphadenopathy CARDIAC: RRR, no murmurs, rubs, gallops RESPIRATORY:  Clear to auscultation without rales, wheezing or rhonchi  ABDOMEN: Soft, non-tender, non-distended MUSCULOSKELETAL:  No edema; No deformity  SKIN: Warm and dry NEUROLOGIC:  Alert and oriented x 3 PSYCHIATRIC:  Normal affect   ASSESSMENT:    1. Primary hypertension   2. Persistent atrial fibrillation (HCC)    PLAN:    In order of problems listed above:  1. Persistent atrial fibrillation Symptomatic.  Has required cardioversion and takes amiodarone.  On Eliquis for stroke prophylaxis.  We discussed the management options for his atrial fibrillation including continued antiarrhythmic use with amiodarone versus ablation.  I do not think that continued use of amiodarone is the best option given his relatively young age.  I discussed ablation in detail with patient during today's visit including the risk, expected efficacy and recovery time.  He wishes to proceed with scheduling.  Risk, benefits, and alternatives to EP study and radiofrequency ablation for afib were also discussed in detail today. These risks include but are not limited to stroke, bleeding, vascular damage, tamponade, perforation, damage to the esophagus, lungs, and other structures, pulmonary vein stenosis,  worsening renal function, and death. The patient understands these risk and wishes to proceed.  We will therefore proceed with catheter ablation at the next available time.  Carto, ICE, anesthesia are requested for the procedure.  Will also obtain CT PV protocol prior to the procedure to exclude LAA thrombus and further evaluate atrial anatomy.  He will also need an echocardiogram prior to the procedure.  He will continue taking Eliquis around the time of the procedure.  We will continue his amiodarone for now.  If he maintains normal rhythm, we will plan to stop the amiodarone at the 34-month follow-up appointment.  Ablation strategy will be PVI plus posterior wall.  2.  Hypertension Controlled.  Continue current regimen.  Total time spent with patient today 65 minutes. This includes reviewing records, evaluating the patient and coordinating care.  Medication Adjustments/Labs and Tests Ordered: Current medicines are reviewed at length with the patient today.  Concerns regarding medicines are outlined above.  Orders Placed This Encounter  Procedures  . CT CARDIAC MORPH/PULM VEIN W/CM&W/O CA SCORE  . Basic Metabolic Panel (BMET)  . CBC w/Diff  . EKG 12-Lead  . ECHOCARDIOGRAM COMPLETE   No orders of the defined types were placed in this encounter.    Signed, Hilton Cork. Quentin Ore, MD, [facility], [facility] 06/16/2020 3:36 PM    Electrophysiology [facility]

## 2020-06-17 DIAGNOSIS — J0100 Acute maxillary sinusitis, unspecified: Secondary | ICD-10-CM | POA: Diagnosis not present

## 2020-07-07 ENCOUNTER — Other Ambulatory Visit: Payer: Self-pay | Admitting: Orthopedic Surgery

## 2020-07-07 ENCOUNTER — Other Ambulatory Visit (HOSPITAL_COMMUNITY): Payer: Self-pay | Admitting: Orthopedic Surgery

## 2020-07-07 DIAGNOSIS — M75122 Complete rotator cuff tear or rupture of left shoulder, not specified as traumatic: Secondary | ICD-10-CM

## 2020-07-08 DIAGNOSIS — R55 Syncope and collapse: Secondary | ICD-10-CM | POA: Diagnosis not present

## 2020-07-12 ENCOUNTER — Telehealth: Payer: Self-pay

## 2020-07-12 NOTE — Telephone Encounter (Signed)
Pt advised that he will have his Cardiac CT cancelled and will have a TEE the morning of his Afib Ablation with arrival at 7:30 am. Pt to have labs 07/14/20 at [facility].   He verbalized understanding if all of this instructions.   Reviewing with Dr. Quentin Ore if the pt still needs his planned Echo also on 5/18.

## 2020-07-13 NOTE — Telephone Encounter (Signed)
Left message for the pt that Dr. Quentin Ore still wants him to have the regular surface Echo previously planned for 07/14/20 at [facility]. He will also have his labs this day.   Detailed message left per his DPR.

## 2020-07-13 NOTE — Telephone Encounter (Signed)
Pt advised and verbalized understanding.

## 2020-07-14 ENCOUNTER — Other Ambulatory Visit: Payer: Self-pay

## 2020-07-14 ENCOUNTER — Other Ambulatory Visit
Admission: RE | Admit: 2020-07-14 | Discharge: 2020-07-14 | Disposition: A | Discharge status: Home / Self Care | Payer: PPO | Attending: Cardiology | Admitting: Cardiology

## 2020-07-14 ENCOUNTER — Ambulatory Visit (INDEPENDENT_AMBULATORY_CARE_PROVIDER_SITE_OTHER): Payer: PPO

## 2020-07-14 DIAGNOSIS — I4819 Other persistent atrial fibrillation: Secondary | ICD-10-CM

## 2020-07-14 LAB — CBC WITH DIFFERENTIAL/PLATELET
Abs Immature Granulocytes: 0.02 10*3/uL (ref 0.00–0.07)
Basophils Absolute: 0.1 10*3/uL (ref 0.0–0.1)
Basophils Relative: 1 %
Eosinophils Absolute: 0.1 10*3/uL (ref 0.0–0.5)
Eosinophils Relative: 1 %
HCT: 32.7 % — ABNORMAL LOW (ref 39.0–52.0)
Hemoglobin: 11.7 g/dL — ABNORMAL LOW (ref 13.0–17.0)
Immature Granulocytes: 0 %
Lymphocytes Relative: 17 %
Lymphs Abs: 1 10*3/uL (ref 0.7–4.0)
MCH: 31.9 pg (ref 26.0–34.0)
MCHC: 35.8 g/dL (ref 30.0–36.0)
MCV: 89.1 fL (ref 80.0–100.0)
Monocytes Absolute: 0.6 10*3/uL (ref 0.1–1.0)
Monocytes Relative: 11 %
Neutro Abs: 4 10*3/uL (ref 1.7–7.7)
Neutrophils Relative %: 70 %
Platelets: 245 10*3/uL (ref 150–400)
RBC: 3.67 MIL/uL — ABNORMAL LOW (ref 4.22–5.81)
RDW: 14.8 % (ref 11.5–15.5)
WBC: 5.8 10*3/uL (ref 4.0–10.5)
nRBC: 0 % (ref 0.0–0.2)

## 2020-07-14 LAB — ECHOCARDIOGRAM COMPLETE
AR max vel: 2.27 cm2
AV Area VTI: 2.2 cm2
AV Area mean vel: 2.06 cm2
AV Mean grad: 7 mmHg
AV Peak grad: 15.4 mmHg
Ao pk vel: 1.96 m/s
Area-P 1/2: 3.02 cm2
Calc EF: 57.1 %
MV VTI: 2.52 cm2
S' Lateral: 2.8 cm
Single Plane A2C EF: 55.8 %
Single Plane A4C EF: 55 %

## 2020-07-14 LAB — BASIC METABOLIC PANEL
Anion gap: 8 (ref 5–15)
BUN: 25 mg/dL — ABNORMAL HIGH (ref 8–23)
CO2: 25 mmol/L (ref 22–32)
Calcium: 8.7 mg/dL — ABNORMAL LOW (ref 8.9–10.3)
Chloride: 100 mmol/L (ref 98–111)
Creatinine, Ser: 0.83 mg/dL (ref 0.61–1.24)
GFR, Estimated: >60 mL/min (ref >60)
Glucose, Bld: 119 mg/dL — ABNORMAL HIGH (ref 70–99)
Potassium: 4.4 mmol/L (ref 3.5–5.1)
Sodium: 133 mmol/L — ABNORMAL LOW (ref 135–145)

## 2020-07-15 ENCOUNTER — Ambulatory Visit
Admission: RE | Admit: 2020-07-15 | Discharge: 2020-07-15 | Disposition: A | Discharge status: Home / Self Care | Payer: PPO | Source: Ambulatory Visit | Attending: Orthopedic Surgery | Admitting: Orthopedic Surgery

## 2020-07-15 ENCOUNTER — Ambulatory Visit: Admission: RE | Admit: 2020-07-15 | Payer: PPO | Source: Ambulatory Visit

## 2020-07-15 DIAGNOSIS — M75122 Complete rotator cuff tear or rupture of left shoulder, not specified as traumatic: Secondary | ICD-10-CM | POA: Diagnosis not present

## 2020-07-15 DIAGNOSIS — M19012 Primary osteoarthritis, left shoulder: Secondary | ICD-10-CM | POA: Diagnosis not present

## 2020-07-15 DIAGNOSIS — S46112A Strain of muscle, fascia and tendon of long head of biceps, left arm, initial encounter: Secondary | ICD-10-CM | POA: Diagnosis not present

## 2020-07-19 ENCOUNTER — Other Ambulatory Visit: Payer: Self-pay | Admitting: Orthopedic Surgery

## 2020-07-19 DIAGNOSIS — M75122 Complete rotator cuff tear or rupture of left shoulder, not specified as traumatic: Secondary | ICD-10-CM

## 2020-07-20 ENCOUNTER — Ambulatory Visit
Admission: RE | Admit: 2020-07-20 | Discharge: 2020-07-20 | Disposition: A | Discharge status: Home / Self Care | Payer: PPO | Source: Ambulatory Visit | Attending: Orthopedic Surgery | Admitting: Orthopedic Surgery

## 2020-07-20 ENCOUNTER — Other Ambulatory Visit: Payer: Self-pay

## 2020-07-20 DIAGNOSIS — Z01818 Encounter for other preprocedural examination: Secondary | ICD-10-CM | POA: Diagnosis not present

## 2020-07-20 DIAGNOSIS — M75102 Unspecified rotator cuff tear or rupture of left shoulder, not specified as traumatic: Secondary | ICD-10-CM | POA: Diagnosis not present

## 2020-07-20 DIAGNOSIS — M75122 Complete rotator cuff tear or rupture of left shoulder, not specified as traumatic: Secondary | ICD-10-CM | POA: Diagnosis not present

## 2020-07-20 DIAGNOSIS — M6258 Muscle wasting and atrophy, not elsewhere classified, other site: Secondary | ICD-10-CM | POA: Diagnosis not present

## 2020-07-20 DIAGNOSIS — M19012 Primary osteoarthritis, left shoulder: Secondary | ICD-10-CM | POA: Diagnosis not present

## 2020-07-21 ENCOUNTER — Other Ambulatory Visit: Payer: PPO

## 2020-07-22 NOTE — Pre-Procedure Instructions (Signed)
Instructed patient on the following items: Arrival time 0630, Tee at 0730 Nothing to eat or drink after midnight No meds AM of procedure Responsible person to drive you home and stay with you for 24 hrs  Have you missed any doses of anti-coagulant Eliquis- hasn't missed any doses

## 2020-07-23 ENCOUNTER — Encounter (HOSPITAL_COMMUNITY): Payer: Self-pay | Admitting: Cardiology

## 2020-07-23 ENCOUNTER — Ambulatory Visit (HOSPITAL_COMMUNITY): Payer: PPO | Admitting: Anesthesiology

## 2020-07-23 ENCOUNTER — Ambulatory Visit (HOSPITAL_BASED_OUTPATIENT_CLINIC_OR_DEPARTMENT_OTHER): Payer: PPO

## 2020-07-23 ENCOUNTER — Other Ambulatory Visit: Payer: Self-pay

## 2020-07-23 ENCOUNTER — Ambulatory Visit (HOSPITAL_COMMUNITY)
Admission: RE | Admit: 2020-07-23 | Discharge: 2020-07-23 | Disposition: A | Discharge status: Home / Self Care | Payer: PPO | Attending: Cardiology | Admitting: Cardiology

## 2020-07-23 ENCOUNTER — Ambulatory Visit (HOSPITAL_COMMUNITY)
Admission: RE | Disposition: A | Discharge status: Home / Self Care | Payer: Self-pay | Source: Home / Self Care | Attending: Cardiology

## 2020-07-23 ENCOUNTER — Encounter (HOSPITAL_COMMUNITY)
Admission: RE | Disposition: A | Discharge status: Home / Self Care | Payer: Self-pay | Source: Home / Self Care | Attending: Cardiology

## 2020-07-23 DIAGNOSIS — I4892 Unspecified atrial flutter: Secondary | ICD-10-CM | POA: Diagnosis not present

## 2020-07-23 DIAGNOSIS — I340 Nonrheumatic mitral (valve) insufficiency: Secondary | ICD-10-CM | POA: Diagnosis not present

## 2020-07-23 DIAGNOSIS — E119 Type 2 diabetes mellitus without complications: Secondary | ICD-10-CM | POA: Insufficient documentation

## 2020-07-23 DIAGNOSIS — I10 Essential (primary) hypertension: Secondary | ICD-10-CM | POA: Diagnosis not present

## 2020-07-23 DIAGNOSIS — Z79899 Other long term (current) drug therapy: Secondary | ICD-10-CM | POA: Insufficient documentation

## 2020-07-23 DIAGNOSIS — I4819 Other persistent atrial fibrillation: Secondary | ICD-10-CM | POA: Insufficient documentation

## 2020-07-23 DIAGNOSIS — Z8673 Personal history of transient ischemic attack (TIA), and cerebral infarction without residual deficits: Secondary | ICD-10-CM | POA: Insufficient documentation

## 2020-07-23 DIAGNOSIS — D630 Anemia in neoplastic disease: Secondary | ICD-10-CM | POA: Diagnosis not present

## 2020-07-23 DIAGNOSIS — Z85828 Personal history of other malignant neoplasm of skin: Secondary | ICD-10-CM | POA: Diagnosis not present

## 2020-07-23 DIAGNOSIS — I081 Rheumatic disorders of both mitral and tricuspid valves: Secondary | ICD-10-CM | POA: Diagnosis not present

## 2020-07-23 DIAGNOSIS — I4891 Unspecified atrial fibrillation: Secondary | ICD-10-CM | POA: Diagnosis not present

## 2020-07-23 DIAGNOSIS — C44310 Basal cell carcinoma of skin of unspecified parts of face: Secondary | ICD-10-CM | POA: Diagnosis not present

## 2020-07-23 DIAGNOSIS — Z7901 Long term (current) use of anticoagulants: Secondary | ICD-10-CM | POA: Insufficient documentation

## 2020-07-23 DIAGNOSIS — E785 Hyperlipidemia, unspecified: Secondary | ICD-10-CM | POA: Insufficient documentation

## 2020-07-23 HISTORY — PX: TEE WITHOUT CARDIOVERSION: SHX5443

## 2020-07-23 HISTORY — PX: ATRIAL FIBRILLATION ABLATION: EP1191

## 2020-07-23 LAB — POCT ACTIVATED CLOTTING TIME
Activated Clotting Time: 225 seconds
Activated Clotting Time: 297 seconds
Activated Clotting Time: 297 seconds
Activated Clotting Time: 315 seconds

## 2020-07-23 SURGERY — ECHOCARDIOGRAM, TRANSESOPHAGEAL
Anesthesia: Monitor Anesthesia Care

## 2020-07-23 SURGERY — ATRIAL FIBRILLATION ABLATION
Anesthesia: General

## 2020-07-23 MED ORDER — HEPARIN (PORCINE) IN NACL 1000-0.9 UT/500ML-% IV SOLN
INTRAVENOUS | Status: DC | PRN
  Administered 2020-07-23: 500 mL

## 2020-07-23 MED ORDER — ISOPROTERENOL HCL 0.2 MG/ML IJ SOLN
INTRAMUSCULAR | Status: AC
  Filled 2020-07-23: qty 5

## 2020-07-23 MED ORDER — DEXAMETHASONE SODIUM PHOSPHATE 10 MG/ML IJ SOLN
INTRAMUSCULAR | Status: DC | PRN
  Administered 2020-07-23: 10 mg via INTRAVENOUS

## 2020-07-23 MED ORDER — HEPARIN (PORCINE) IN NACL 1000-0.9 UT/500ML-% IV SOLN
INTRAVENOUS | Status: AC
  Filled 2020-07-23: qty 500

## 2020-07-23 MED ORDER — PANTOPRAZOLE SODIUM 40 MG PO TBEC
40.0000 mg | DELAYED_RELEASE_TABLET | Freq: Every day | ORAL | Status: DC
  Administered 2020-07-23: 40 mg via ORAL
  Filled 2020-07-23: qty 1

## 2020-07-23 MED ORDER — HEPARIN (PORCINE) IN NACL 1000-0.9 UT/500ML-% IV SOLN
INTRAVENOUS | Status: DC | PRN
Start: 2020-07-23 — End: 2020-07-23
  Administered 2020-07-23: 500 mL

## 2020-07-23 MED ORDER — SODIUM CHLORIDE 0.9 % IV SOLN
INTRAVENOUS | Status: AC | PRN
  Administered 2020-07-23: 500 mL via INTRAVENOUS

## 2020-07-23 MED ORDER — SODIUM CHLORIDE 0.9% FLUSH: 3.0000 mL | INTRAVENOUS | Status: DC | PRN

## 2020-07-23 MED ORDER — LIDOCAINE 2% (20 MG/ML) 5 ML SYRINGE
INTRAMUSCULAR | Status: DC | PRN
  Administered 2020-07-23: 100 mg via INTRAVENOUS

## 2020-07-23 MED ORDER — PROPOFOL 10 MG/ML IV BOLUS
INTRAVENOUS | Status: DC | PRN
  Administered 2020-07-23 (×2): 20 mg via INTRAVENOUS

## 2020-07-23 MED ORDER — SODIUM CHLORIDE 0.9 % IV SOLN: INTRAVENOUS | Status: DC

## 2020-07-23 MED ORDER — SODIUM CHLORIDE 0.9% FLUSH: 3.0000 mL | Freq: Two times a day (BID) | INTRAVENOUS | Status: DC

## 2020-07-23 MED ORDER — ACETAMINOPHEN 325 MG PO TABS: 650.0000 mg | ORAL_TABLET | ORAL | Status: DC | PRN

## 2020-07-23 MED ORDER — PHENYLEPHRINE 40 MCG/ML (10ML) SYRINGE FOR IV PUSH (FOR BLOOD PRESSURE SUPPORT)
PREFILLED_SYRINGE | INTRAVENOUS | Status: DC | PRN
  Administered 2020-07-23: 80 ug via INTRAVENOUS

## 2020-07-23 MED ORDER — HEPARIN SODIUM (PORCINE) 1000 UNIT/ML IJ SOLN
INTRAMUSCULAR | Status: DC | PRN
  Administered 2020-07-23: 4000 [IU] via INTRAVENOUS
  Administered 2020-07-23: 5000 [IU] via INTRAVENOUS
  Administered 2020-07-23: 11000 [IU] via INTRAVENOUS
  Administered 2020-07-23: 4000 [IU] via INTRAVENOUS

## 2020-07-23 MED ORDER — ISOPROTERENOL HCL 0.2 MG/ML IJ SOLN
INTRAVENOUS | Status: DC | PRN
  Administered 2020-07-23: 2 ug/min via INTRAVENOUS

## 2020-07-23 MED ORDER — ONDANSETRON HCL 4 MG/2ML IJ SOLN
INTRAMUSCULAR | Status: DC | PRN
  Administered 2020-07-23: 4 mg via INTRAVENOUS

## 2020-07-23 MED ORDER — SUGAMMADEX SODIUM 200 MG/2ML IV SOLN
INTRAVENOUS | Status: DC | PRN
  Administered 2020-07-23: 200 mg via INTRAVENOUS

## 2020-07-23 MED ORDER — APIXABAN 5 MG PO TABS
5.0000 mg | ORAL_TABLET | Freq: Two times a day (BID) | ORAL | Status: DC
  Administered 2020-07-23: 5 mg via ORAL
  Filled 2020-07-23: qty 1

## 2020-07-23 MED ORDER — EPHEDRINE SULFATE-NACL 50-0.9 MG/10ML-% IV SOSY
PREFILLED_SYRINGE | INTRAVENOUS | Status: DC | PRN
  Administered 2020-07-23: 10 mg via INTRAVENOUS
  Administered 2020-07-23 (×2): 5 mg via INTRAVENOUS

## 2020-07-23 MED ORDER — ROCURONIUM BROMIDE 10 MG/ML (PF) SYRINGE
PREFILLED_SYRINGE | INTRAVENOUS | Status: DC | PRN
  Administered 2020-07-23: 30 mg via INTRAVENOUS
  Administered 2020-07-23: 70 mg via INTRAVENOUS

## 2020-07-23 MED ORDER — PHENYLEPHRINE 40 MCG/ML (10ML) SYRINGE FOR IV PUSH (FOR BLOOD PRESSURE SUPPORT)
PREFILLED_SYRINGE | INTRAVENOUS | Status: DC | PRN
  Administered 2020-07-23: 40 ug via INTRAVENOUS

## 2020-07-23 MED ORDER — PHENYLEPHRINE HCL-NACL 10-0.9 MG/250ML-% IV SOLN
INTRAVENOUS | Status: DC | PRN
  Administered 2020-07-23: 20 ug/min via INTRAVENOUS

## 2020-07-23 MED ORDER — PROPOFOL 10 MG/ML IV BOLUS
INTRAVENOUS | Status: DC | PRN
  Administered 2020-07-23: 30 mg via INTRAVENOUS
  Administered 2020-07-23: 150 mg via INTRAVENOUS

## 2020-07-23 MED ORDER — FENTANYL CITRATE (PF) 100 MCG/2ML IJ SOLN
INTRAMUSCULAR | Status: DC | PRN
  Administered 2020-07-23: 100 ug via INTRAVENOUS

## 2020-07-23 MED ORDER — MIDAZOLAM HCL 2 MG/2ML IJ SOLN
INTRAMUSCULAR | Status: DC | PRN
  Administered 2020-07-23: 2 mg via INTRAVENOUS

## 2020-07-23 MED ORDER — HEPARIN SODIUM (PORCINE) 1000 UNIT/ML IJ SOLN
INTRAMUSCULAR | Status: AC
  Filled 2020-07-23: qty 1

## 2020-07-23 MED ORDER — PROTAMINE SULFATE 10 MG/ML IV SOLN
INTRAVENOUS | Status: DC | PRN
  Administered 2020-07-23: 10 mg via INTRAVENOUS
  Administered 2020-07-23: 20 mg via INTRAVENOUS

## 2020-07-23 MED ORDER — LACTATED RINGERS IV SOLN: INTRAVENOUS | Status: DC | PRN

## 2020-07-23 MED ORDER — HEPARIN SODIUM (PORCINE) 1000 UNIT/ML IJ SOLN
INTRAMUSCULAR | Status: DC | PRN
  Administered 2020-07-23: 1000 [IU] via INTRAVENOUS

## 2020-07-23 MED ORDER — ONDANSETRON HCL 4 MG/2ML IJ SOLN: 4.0000 mg | Freq: Four times a day (QID) | INTRAMUSCULAR | Status: DC | PRN

## 2020-07-23 MED ORDER — PANTOPRAZOLE SODIUM 40 MG PO TBEC: 40.0000 mg | DELAYED_RELEASE_TABLET | Freq: Every day | ORAL | 0 refills | Status: DC

## 2020-07-23 MED ORDER — LIDOCAINE HCL (PF) 1 % IJ SOLN
INTRAMUSCULAR | Status: AC
  Filled 2020-07-23: qty 30

## 2020-07-23 MED ORDER — PROPOFOL 500 MG/50ML IV EMUL
INTRAVENOUS | Status: DC | PRN
  Administered 2020-07-23: 100 ug/kg/min via INTRAVENOUS

## 2020-07-23 MED ORDER — LIDOCAINE 2% (20 MG/ML) 5 ML SYRINGE
INTRAMUSCULAR | Status: DC | PRN
  Administered 2020-07-23: 60 mg via INTRAVENOUS

## 2020-07-23 MED ORDER — SODIUM CHLORIDE 0.9 % IV SOLN: INTRAVENOUS | Status: DC | PRN

## 2020-07-23 MED ORDER — SODIUM CHLORIDE 0.9 % IV SOLN: 250.0000 mL | INTRAVENOUS | Status: DC | PRN

## 2020-07-23 SURGICAL SUPPLY — 20 items
BLANKET WARM UNDERBOD FULL ACC (MISCELLANEOUS) ×4 IMPLANT
CATH MAPPNG PENTARAY F 2-6-2MM (CATHETERS) ×1 IMPLANT
CATH S CIRCA THERM PROBE 10F (CATHETERS) ×2 IMPLANT
CATH SMTCH THERMOCOOL SF DF (CATHETERS) ×2 IMPLANT
CATH SOUNDSTAR ECO 8FR (CATHETERS) ×2 IMPLANT
CATH WEBSTER BI DIR CS D-F CRV (CATHETERS) ×2 IMPLANT
CLOSURE PERCLOSE PROSTYLE (VASCULAR PRODUCTS) ×6 IMPLANT
COVER SWIFTLINK CONNECTOR (BAG) ×2 IMPLANT
MAT PREVALON FULL STRYKER (MISCELLANEOUS) ×2 IMPLANT
PACK EP LATEX FREE (CUSTOM PROCEDURE TRAY) ×2
PACK EP LF (CUSTOM PROCEDURE TRAY) ×1 IMPLANT
PAD PRO RADIOLUCENT 2001M-C (PAD) ×2 IMPLANT
PATCH CARTO3 (PAD) ×4 IMPLANT
PENTARAY F 2-6-2MM (CATHETERS) ×2
SHEATH BAYLIS TRANSSEPTAL 98CM (NEEDLE) ×2 IMPLANT
SHEATH CARTO VIZIGO SM CVD (SHEATH) ×2 IMPLANT
SHEATH PINNACLE 8F 10CM (SHEATH) ×4 IMPLANT
SHEATH PINNACLE 9F 10CM (SHEATH) ×2 IMPLANT
SHEATH PROBE COVER 6X72 (BAG) ×2 IMPLANT
TUBING SMART ABLATE COOLFLOW (TUBING) ×2 IMPLANT

## 2020-07-23 NOTE — Anesthesia Preprocedure Evaluation (Signed)
Anesthesia Evaluation  Patient identified by MRN, date of birth, ID band Patient awake    Reviewed: Allergy & Precautions, NPO status , Patient's Chart, lab work & pertinent test results, reviewed documented beta blocker date and time   History of Anesthesia Complications Negative for: history of anesthetic complications  Airway Mallampati: I  TM Distance: >3 FB Neck ROM: Full    Dental  (+) Partial Upper, Dental Advisory Given   Pulmonary neg pulmonary ROS,    Pulmonary exam normal        Cardiovascular hypertension, Pt. on home beta blockers and Pt. on medications + dysrhythmias Atrial Fibrillation  Rhythm:Irregular Rate:Bradycardia     Neuro/Psych CVA (2019), No Residual Symptoms    GI/Hepatic negative GI ROS, Neg liver ROS,   Endo/Other  diabetes, Type 2  Renal/GU negative Renal ROS  negative genitourinary   Musculoskeletal negative musculoskeletal ROS (+)   Abdominal   Peds  Hematology  (+) anemia ,   Anesthesia Other Findings  Echo 07/14/20: 1. Left ventricular ejection fraction, by estimation, is 60 to 65%. The left ventricle has normal function. The left ventricle has no regional wall motion abnormalities. There is mild left ventricular hypertrophy. Left ventricular diastolic parameters  are consistent with Grade II diastolic dysfunction (pseudonormalization). 2. Right ventricular systolic function is normal. The right ventricular size is normal. 3. Left atrial size was moderately dilated. 4. Right atrial size was mildly dilated. 5. The mitral valve is normal in structure. Trivial mitral valve regurgitation. 6. The aortic valve is tricuspid. Aortic valve regurgitation is not visualized. 7. The inferior vena cava is normal in size with <50% respiratory variability, suggesting right atrial pressure of 8 mmHg.  Reproductive/Obstetrics                             Anesthesia  Physical Anesthesia Plan  ASA: III  Anesthesia Plan: General   Post-op Pain Management:    Induction: Intravenous  PONV Risk Score and Plan: 2 and Ondansetron, Dexamethasone, Treatment may vary due to age or medical condition and Midazolam  Airway Management Planned: Oral ETT  Additional Equipment: None  Intra-op Plan:   Post-operative Plan: Extubation in OR  Informed Consent: I have reviewed the patients History and Physical, chart, labs and discussed the procedure including the risks, benefits and alternatives for the proposed anesthesia with the patient or authorized representative who has indicated his/her understanding and acceptance.     Dental advisory given  Plan Discussed with:   Anesthesia Plan Comments:         Anesthesia Quick Evaluation

## 2020-07-23 NOTE — Anesthesia Procedure Notes (Addendum)
Procedure Name: MAC Date/Time: 07/23/2020 8:12 AM Performed by: Rande Brunt, CRNA Pre-anesthesia Checklist: Patient identified, Emergency Drugs available, Suction available and Patient being monitored Patient Re-evaluated:Patient Re-evaluated prior to induction Oxygen Delivery Method: Nasal cannula Preoxygenation: Pre-oxygenation with 100% oxygen Induction Type: IV induction Airway Equipment and Method: Bite block Placement Confirmation: positive ETCO2 and CO2 detector Dental Injury: Teeth and Oropharynx as per pre-operative assessment

## 2020-07-23 NOTE — CV Procedure (Signed)
   Transesophageal Echocardiogram  Indications:AFIB ablation  Time out performed  Propofol anesthesia present  Findings:  Left Ventricle: EF 60%  Mitral Valve: Mild MR  Aortic Valve: Normal  Tricuspid Valve: Mild TR  Left Atrium: Normal, no LAA thrombus  Bubble Contrast Study: n/a  IMPRESSION: No thrombus, proceed with ablation.   Candee Furbish, MD

## 2020-07-23 NOTE — H&P (Signed)
Chief Complaint: Atrial fibrillation  History of Present Illness:    Timothy Wells is a 70 y.o. male who presents for an evaluation of atrial fibrillation at the request Dr Clayborn Bigness. Their medical history includes atrial fibrillation, diabetes, stroke, anemia, memory loss, bradycardia, hyperlipidemia.  He is on Eliquis for stroke prophylaxis.  He takes amiodarone.  He last saw Dr. Clayborn Bigness on June 02, 2020.  He had previously undergone a cardioversion on May 18, 2020.  He is symptomatic with his atrial fibrillation.  The first time it happened he was felt like he was in a pass out with heart rates near 200 bpm.  He has done well since starting amiodarone.      Past Medical History:  Diagnosis Date  . A-fib ([place])   . Anemia   . Diabetes mellitus without complication ([place])    type 2  . Dysrhythmia    wide complex tachycardia  . Edema of both lower legs   . Memory loss   . Restless leg syndrome   . Skin cancer    skin cancer face basal cell  . Stroke [facility]) 2019         Past Surgical History:  Procedure Laterality Date  . CARDIOVERSION N/A 03/25/2019   Procedure: CARDIOVERSION;  Surgeon: Yolonda Kida, MD;  Location: [facility] ORS;  Service: Cardiovascular;  Laterality: N/A;  . CARDIOVERSION N/A 05/18/2020   Procedure: CARDIOVERSION;  Surgeon: Yolonda Kida, MD;  Location: [facility] ORS;  Service: Cardiovascular;  Laterality: N/A;  . COLONOSCOPY WITH PROPOFOL N/A 09/10/2019   Procedure: COLONOSCOPY WITH PROPOFOL;  Surgeon: Robert Bellow, MD;  Location: [facility] ENDOSCOPY;  Service: Endoscopy;  Laterality: N/A;  . SHOULDER ARTHROSCOPY WITH SUBACROMIAL DECOMPRESSION AND OPEN ROTATOR C Left 03/08/2020   Procedure: Left shoulder arthroscopic subscapularis repair and biodegradable balloon spacer placement with partial infraspinatus repair;  Surgeon: Leim Fabry, MD;  Location: [facility];  Service: Orthopedics;  Laterality: Left;    Current  Medications: Active Medications      Current Meds  Medication Sig  . acetaminophen (TYLENOL) 500 MG tablet Take 1,000 mg by mouth every 6 (six) hours as needed for mild pain or headache.  Marland Kitchen amiodarone (PACERONE) 400 MG tablet Take 1 tablet (400 mg total) by mouth 2 (two) times daily. (Patient taking differently: Take 200 mg by mouth daily.)  . apixaban (ELIQUIS) 5 MG TABS tablet Take 1 tablet (5 mg total) by mouth 2 (two) times daily.  . Ascorbic Acid (VITAMIN C WITH ROSE HIPS) 1000 MG tablet Take 1,000 mg by mouth daily.  Marland Kitchen atenolol (TENORMIN) 25 MG tablet Take 25 mg by mouth daily.  Marland Kitchen atorvastatin (LIPITOR) 80 MG tablet Take 80 mg by mouth every evening.   . Cholecalciferol (VITAMIN D) 50 MCG (2000 UT) CAPS Take 4,000 Units by mouth daily.  . Chromium-Cinnamon (CINNAMON PLUS CHROMIUM PO) Take 1 tablet by mouth 2 (two) times daily.  Marland Kitchen CRANBERRY EXTRACT PO Take 1 tablet by mouth daily.  Marland Kitchen diltiazem (TIAZAC) 360 MG 24 hr capsule Take 360 mg by mouth daily.  . Ginkgo Biloba 60 MG TABS Take 60 mg by mouth daily.  . Glucosamine-Chondroitin (COSAMIN DS PO) Take 1 tablet by mouth 2 (two) times daily.  Marland Kitchen lisinopril (ZESTRIL) 10 MG tablet Take 10 mg by mouth daily.  . magnesium oxide (MAG-OX) 400 (241.3 Mg) MG tablet Take 1 tablet (400 mg total) by mouth daily.  . Multiple Vitamin (MULTIVITAMIN WITH MINERALS) TABS tablet Take 1 tablet  by mouth daily.  . Omega-3 Fatty Acids (FISH OIL) 1000 MG CAPS Take 1,000 mg by mouth daily.  Marland Kitchen omeprazole (PRILOSEC) 20 MG capsule Take 20 mg by mouth daily.  Marland Kitchen rOPINIRole (REQUIP) 1 MG tablet Take 1 mg by mouth at bedtime.  . tamsulosin (FLOMAX) 0.4 MG CAPS capsule Take 0.4 mg by mouth at bedtime.  . Turmeric Curcumin 500 MG CAPS Take 500 mg by mouth 2 (two) times daily.  . vitamin B-12 (CYANOCOBALAMIN) 1000 MCG tablet Take 1,000 mcg by mouth daily.  . vitamin E 400 UNIT capsule Take 400 Units by mouth daily.  . Zinc 50 MG TABS Take 50 mg by mouth daily.  .  [DISCONTINUED] B Complex-Biotin-FA (SUPER B-COMPLEX PO) Take 1 tablet by mouth daily.       Allergies:   Patient has no known allergies.   Social History        Socioeconomic History  . Marital status: Single    Spouse name: Not on file  . Number of children: Not on file  . Years of education: Not on file  . Highest education level: Not on file  Occupational History  . Not on file  Tobacco Use  . Smoking status: Never Smoker  . Smokeless tobacco: Never Used  Vaping Use  . Vaping Use: Never used  Substance and Sexual Activity  . Alcohol use: Yes    Comment: rare  . Drug use: Not Currently  . Sexual activity: Not on file  Other Topics Concern  . Not on file  Social History Narrative  . Not on file   Social Determinants of Health   Financial Resource Strain: Not on file  Food Insecurity: Not on file  Transportation Needs: Not on file  Physical Activity: Not on file  Stress: Not on file  Social Connections: Not on file     Family History: The patient's family history is not on file.  ROS:   Please see the history of present illness.    All other systems reviewed and are negative.  EKGs/Labs/Other Studies Reviewed:    The following studies were reviewed today:  January 22, 2019 echo Left ventricular function normal, 60% Mild LVH Mildly dilated left atrium Trace MR Trivial TR  May 16, 2019 SPECT stress at [facility] FINDINGS:  Regional wall motion: reveals normal myocardial thickening and wall  motion.  The overall quality of the study is excellent.   Artifacts noted: no  Left ventricularcavity: normal.  Perfusion Analysis: SPECT images demonstrate homogeneous tracer  distribution throughout the myocardium. Defect type: Normal   EKG:  The ekg ordered today demonstrates sinus rhythm  Recent Labs: 03/04/2020: BUN 24; Creatinine, Ser 0.86; Hemoglobin 12.4; Platelets 249; Potassium 4.5; Sodium 138  Recent Lipid Panel Labs (Brief)   No results found for: CHOL, TRIG, HDL, CHOLHDL, VLDL, LDLCALC, LDLDIRECT    Physical Exam:    VS:  BP 110/60   Pulse (!) 58   Ht 5\' 7"  (1.702 m)   Wt 169 lb (76.7 kg)   SpO2 96%   BMI 26.47 kg/m        Wt Readings from Last 3 Encounters:  06/16/20 169 lb (76.7 kg)  05/18/20 170 lb (77.1 kg)  03/08/20 163 lb (73.9 kg)     GEN:  Well nourished, well developed in no acute distress HEENT: Normal NECK: No JVD; No carotid bruits LYMPHATICS: No lymphadenopathy CARDIAC: RRR, no murmurs, rubs, gallops RESPIRATORY:  Clear to auscultation without rales, wheezing or rhonchi  ABDOMEN:  Soft, non-tender, non-distended MUSCULOSKELETAL:  No edema; No deformity  SKIN: Warm and dry NEUROLOGIC:  Alert and oriented x 3 PSYCHIATRIC:  Normal affect   ASSESSMENT:    1. Primary hypertension   2. Persistent atrial fibrillation (HCC)    PLAN:    In order of problems listed above:  1. Persistent atrial fibrillation Symptomatic.  Has required cardioversion and takes amiodarone.  On Eliquis for stroke prophylaxis.  We discussed the management options for his atrial fibrillation including continued antiarrhythmic use with amiodarone versus ablation.  I do not think that continued use of amiodarone is the best option given his relatively young age.  I discussed ablation in detail with patient during today's visit including the risk, expected efficacy and recovery time.  He wishes to proceed with scheduling.  Risk, benefits, and alternatives to EP study and radiofrequency ablation for afib were also discussed in detail today. These risks include but are not limited to stroke, bleeding, vascular damage, tamponade, perforation, damage to the esophagus, lungs, and other structures, pulmonary vein stenosis, worsening renal function, and death. The patient understands these risk and wishes to proceed.  We will therefore proceed with catheter ablation at the next available time.  Carto, ICE,  anesthesia are requested for the procedure.  Will also obtain CT PV protocol prior to the procedure to exclude LAA thrombus and further evaluate atrial anatomy.  He will also need an echocardiogram prior to the procedure.  He will continue taking Eliquis around the time of the procedure.  We will continue his amiodarone for now.  If he maintains normal rhythm, we will plan to stop the amiodarone at the 59-month follow-up appointment.  Ablation strategy will be PVI plus posterior wall.  2.  Hypertension Controlled.  Continue current regimen.  Total time spent with patient today 65 minutes. This includes reviewing records, evaluating the patient and coordinating care.  Medication Adjustments/Labs and Tests Ordered: Current medicines are reviewed at length with the patient today.  Concerns regarding medicines are outlined above.     Orders Placed This Encounter  Procedures  . CT CARDIAC MORPH/PULM VEIN W/CM&W/O CA SCORE  . Basic Metabolic Panel (BMET)  . CBC w/Diff  . EKG 12-Lead  . ECHOCARDIOGRAM COMPLETE   No orders of the defined types were placed in this encounter.    Signed, Hilton Cork. Quentin Ore, MD, [facility], [facility]  Personally seen and examined. Agree with above. TEE today. Risks and benefits explained.   Candee Furbish, MD

## 2020-07-23 NOTE — Anesthesia Procedure Notes (Signed)
Procedure Name: Intubation Date/Time: 07/23/2020 11:55 AM Performed by: Harden Mo, CRNA Pre-anesthesia Checklist: Patient identified, Emergency Drugs available, Suction available and Patient being monitored Patient Re-evaluated:Patient Re-evaluated prior to induction Oxygen Delivery Method: Circle System Utilized Preoxygenation: Pre-oxygenation with 100% oxygen Induction Type: IV induction Ventilation: Mask ventilation without difficulty Grade View: Grade I Tube type: Oral Tube size: 7.5 mm Number of attempts: 1 Airway Equipment and Method: Stylet and Oral airway Placement Confirmation: ETT inserted through vocal cords under direct vision,  positive ETCO2 and breath sounds checked- equal and bilateral Secured at: 23 cm Tube secured with: Tape Dental Injury: Teeth and Oropharynx as per pre-operative assessment

## 2020-07-23 NOTE — Anesthesia Preprocedure Evaluation (Signed)
Anesthesia Evaluation    History of Anesthesia Complications Negative for: history of anesthetic complications  Airway Mallampati: II  TM Distance: >3 FB Neck ROM: Full    Dental  (+) Partial Upper   Pulmonary neg pulmonary ROS, neg sleep apnea, neg COPD, Patient abstained from smoking.Not current smoker,    Pulmonary exam normal breath sounds clear to auscultation       Cardiovascular Exercise Tolerance: Good METShypertension, (-) CAD and (-) Past MI + dysrhythmias Atrial Fibrillation  Rhythm:Irregular Rate:Normal - Systolic murmurs TTE 6712: 1. Left ventricular ejection fraction, by visual estimation, is 60 to  65%. The left ventricle has normal function. Left ventricular septal wall  thickness was moderately increased. Moderately increased left ventricular  posterior wall thickness. There is  mildly increased left ventricular hypertrophy.  2. Global right ventricle has normal systolic function.The right  ventricular size is normal. No increase in right ventricular wall  thickness.  3. Left atrial size was mildly dilated.  4. Right atrial size was normal.  5. The mitral valve is grossly normal. Trace mitral valve regurgitation.  6. The tricuspid valve is grossly normal. Tricuspid valve regurgitation  is trivial.  7. The aortic valve is tricuspid. Aortic valve regurgitation is not  visualized.  8. The pulmonic valve was not well visualized. Pulmonic valve  regurgitation is trivial.  9. The atrial septum is grossly normal.    Neuro/Psych TIAnegative psych ROS   GI/Hepatic neg GERD  ,(+)     (-) substance abuse  ,   Endo/Other  diabetes  Renal/GU negative Renal ROS     Musculoskeletal   Abdominal   Peds  Hematology  (+) anemia ,   Anesthesia Other Findings Past Medical History: No date: A-fib (HCC) No date: Anemia No date: Diabetes mellitus without complication (HCC)     Comment:  type 2 No date:  Dysrhythmia     Comment:  wide complex tachycardia No date: Edema of both lower legs No date: Memory loss No date: Restless leg syndrome No date: Skin cancer     Comment:  skin cancer face basal cell 2019: Stroke ([place])  Reproductive/Obstetrics                             Anesthesia Physical  Anesthesia Plan  ASA: III  Anesthesia Plan: MAC   Post-op Pain Management:    Induction: Intravenous  PONV Risk Score and Plan: 2 and Ondansetron, Propofol infusion and TIVA  Airway Management Planned: Nasal Cannula  Additional Equipment: None  Intra-op Plan:   Post-operative Plan:   Informed Consent: I have reviewed the patients History and Physical, chart, labs and discussed the procedure including the risks, benefits and alternatives for the proposed anesthesia with the patient or authorized representative who has indicated his/her understanding and acceptance.     Dental advisory given  Plan Discussed with: CRNA and Surgeon  Anesthesia Plan Comments:         Anesthesia Quick Evaluation

## 2020-07-23 NOTE — Anesthesia Postprocedure Evaluation (Signed)
Anesthesia Post Note  Patient: Timothy Wells  Procedure(s) Performed: TRANSESOPHAGEAL ECHOCARDIOGRAM (TEE) (N/A )     Patient location during evaluation: PACU Anesthesia Type: MAC Level of consciousness: sedated Pain management: pain level controlled Vital Signs Assessment: post-procedure vital signs reviewed and stable Respiratory status: spontaneous breathing and respiratory function stable Cardiovascular status: stable Postop Assessment: no apparent nausea or vomiting Anesthetic complications: no   No complications documented.  Last Vitals:  Vitals:   07/23/20 0838 07/23/20 0849  BP: 106/63 113/62  Pulse: (!) 57 (!) 55  Resp: 20 12  Temp:    SpO2: 97% 100%    Last Pain:  Vitals:   07/23/20 0849  TempSrc:   PainSc: 0-No pain                 Merlinda Frederick

## 2020-07-23 NOTE — Anesthesia Postprocedure Evaluation (Signed)
Anesthesia Post Note  Patient: Timothy Wells  Procedure(s) Performed: ATRIAL FIBRILLATION ABLATION (N/A )     Patient location during evaluation: Cath Lab Anesthesia Type: General Level of consciousness: awake and alert, patient cooperative and oriented Pain management: pain level controlled Vital Signs Assessment: post-procedure vital signs reviewed and stable Respiratory status: spontaneous breathing, nonlabored ventilation and respiratory function stable Cardiovascular status: blood pressure returned to baseline and stable Postop Assessment: no apparent nausea or vomiting Anesthetic complications: no   No complications documented.  Last Vitals:  Vitals:   07/23/20 1445 07/23/20 1450  BP: 116/73 121/70  Pulse: 63 68  Resp: 15 13  Temp:    SpO2: 96% 95%    Last Pain:  Vitals:   07/23/20 1435  TempSrc: Temporal  PainSc:                  Marylouise Mallet,E. Dua Mehler

## 2020-07-23 NOTE — Transfer of Care (Signed)
Immediate Anesthesia Transfer of Care Note  Patient: Timothy Wells  Procedure(s) Performed: ATRIAL FIBRILLATION ABLATION (N/A )  Patient Location: Cath Lab  Anesthesia Type:General  Level of Consciousness: awake, alert  and oriented  Airway & Oxygen Therapy: Patient Spontanous Breathing  Post-op Assessment: Report given to RN, Post -op Vital signs reviewed and stable and Patient moving all extremities X 4  Post vital signs: Reviewed and stable  Last Vitals:  Vitals Value Taken Time  BP 127/76 07/23/20 1428  Temp    Pulse 66 07/23/20 1431  Resp 14 07/23/20 1431  SpO2 98 % 07/23/20 1431  Vitals shown include unvalidated device data.  Last Pain:  Vitals:   07/23/20 0849  TempSrc:   PainSc: 0-No pain         Complications: No complications documented.

## 2020-07-23 NOTE — Discharge Instructions (Signed)
Post procedure care instructions No driving for 4 days. No lifting over 5 lbs for 1 week. No vigorous or sexual activity for 1 week. You may return to work/your usual activities on 07/31/20. Keep procedure site clean & dry. If you notice increased pain, swelling, bleeding or pus, call/return!  You may shower after 24 hours, but no soaking in baths/hot tubs/pools for 1 week.  You have an appointment set up with the [facility].  Multiple studies have shown that being followed by a dedicated atrial fibrillation clinic in addition to the standard care you receive from your other physicians improves health. We believe that enrollment in the atrial fibrillation clinic will allow [place] to better care for you.   The phone number to the [facility] is [phone]. The clinic is staffed Monday through Friday from 8:30am to 5pm.  Parking Directions: The clinic is located in the Heart and Vascular Building connected to [facility]. 1)From [address] turn on to [address] and go to the 3rd entrance  (Heart and Vascular entrance) on the right. 2)Look to the right for Heart &Vascular Parking Garage. 3)A code for the entrance is required for June is 4233 4)Take the elevators to the 1st floor. Registration is in the room with the glass walls at the end of the hallway.  If you have any trouble parking or locating the clinic, please don't hesitate to call [phone].   Cardiac Ablation, Care After  This sheet gives you information about how to care for yourself after your procedure. Your health care provider may also give you more specific instructions. If you have problems or questions, contact your health care provider. What can I expect after the procedure? After the procedure, it is common to have:  Bruising around your puncture site.  Tenderness around your puncture site.  Skipped heartbeats.  Tiredness (fatigue).  Follow these instructions at  home: Puncture site care   Follow instructions from your health care provider about how to take care of your puncture site. Make sure you: ? If present, leave stitches (sutures), skin glue, or adhesive strips in place. These skin closures may need to stay in place for up to 2 weeks. If adhesive strip edges start to loosen and curl up, you may trim the loose edges. Do not remove adhesive strips completely unless your health care provider tells you to do that. ? If a large square bandage is present, this may be removed 24 hours after surgery.   Check your puncture site every day for signs of infection. Check for: ? Redness, swelling, or pain. ? Fluid or blood. If your puncture site starts to bleed, lie down on your back, apply firm pressure to the area, and contact your health care provider. ? Warmth. ? Pus or a bad smell. Driving  Do not drive for at least 4 days after your procedure or however long your health care provider recommends. (Do not resume driving if you have previously been instructed not to drive for other health reasons.)  Do not drive or use heavy machinery while taking prescription pain medicine. Activity  Avoid activities that take a lot of effort for at least 7 days after your procedure.  Do not lift anything that is heavier than 5 lb (4.5 kg) for one week.   No sexual activity for 1 week.   Return to your normal activities as told by your health care provider. Ask your health care provider what activities are safe for you.  General instructions  Take over-the-counter and prescription medicines only as told by your health care provider.  Do not use any products that contain nicotine or tobacco, such as cigarettes and e-cigarettes. If you need help quitting, ask your health care provider.  You may shower after 24 hours, but Do not take baths, swim, or use a hot tub for 1 week.   Do not drink alcohol for 24 hours after your procedure.  Keep all follow-up visits as  told by your health care provider. This is important. Contact a health care provider if:  You have redness, mild swelling, or pain around your puncture site.  You have fluid or blood coming from your puncture site that stops after applying firm pressure to the area.  Your puncture site feels warm to the touch.  You have pus or a bad smell coming from your puncture site.  You have a fever.  You have chest pain or discomfort that spreads to your neck, jaw, or arm.  You are sweating a lot.  You feel nauseous.  You have a fast or irregular heartbeat.  You have shortness of breath.  You are dizzy or light-headed and feel the need to lie down.  You have pain or numbness in the arm or leg closest to your puncture site. Get help right away if:  Your puncture site suddenly swells.  Your puncture site is bleeding and the bleeding does not stop after applying firm pressure to the area. These symptoms may represent a serious problem that is an emergency. Do not wait to see if the symptoms will go away. Get medical help right away. Call your local emergency services (911 in the [place] Do not drive yourself to the hospital. Summary  After the procedure, it is normal to have bruising and tenderness at the puncture site in your groin, neck, or forearm.  Check your puncture site every day for signs of infection.  Get help right away if your puncture site is bleeding and the bleeding does not stop after applying firm pressure to the area. This is a medical emergency. This information is not intended to replace advice given to you by your health care provider. Make sure you discuss any questions you have with your health care provider.

## 2020-07-23 NOTE — Progress Notes (Signed)
  Echocardiogram Echocardiogram Transesophageal has been performed.  Bobbye Charleston 07/23/2020, 9:18 AM

## 2020-07-23 NOTE — Transfer of Care (Signed)
Immediate Anesthesia Transfer of Care Note  Patient: Shawnmichael Parenteau Noreen  Procedure(s) Performed: TRANSESOPHAGEAL ECHOCARDIOGRAM (TEE) (N/A )  Patient Location: Endoscopy Unit  Anesthesia Type:MAC  Level of Consciousness: awake, drowsy and pateint uncooperative  Airway & Oxygen Therapy: Patient Spontanous Breathing  Post-op Assessment: Report given to RN, Post -op Vital signs reviewed and stable and Patient moving all extremities  Post vital signs: Reviewed and stable  Last Vitals:  Vitals Value Taken Time  BP 104/57 07/23/20 0829  Temp    Pulse 55 07/23/20 0829  Resp 11 07/23/20 0829  SpO2 97 % 07/23/20 0829  Vitals shown include unvalidated device data.  Last Pain:  Vitals:   07/23/20 0705  TempSrc: Oral  PainSc: 0-No pain         Complications: No complications documented.

## 2020-07-26 ENCOUNTER — Encounter (HOSPITAL_COMMUNITY): Payer: Self-pay | Admitting: Cardiology

## 2020-07-27 ENCOUNTER — Encounter (HOSPITAL_COMMUNITY): Payer: Self-pay | Admitting: Cardiology

## 2020-07-27 DIAGNOSIS — M75122 Complete rotator cuff tear or rupture of left shoulder, not specified as traumatic: Secondary | ICD-10-CM | POA: Diagnosis not present

## 2020-07-27 MED FILL — Lidocaine HCl Local Preservative Free (PF) Inj 1%: INTRAMUSCULAR | Qty: 30 | Status: AC

## 2020-07-29 ENCOUNTER — Telehealth: Payer: Self-pay | Admitting: Cardiology

## 2020-07-29 NOTE — Telephone Encounter (Signed)
Returned call to Pt.  Advised he should remove gauze from his cath sites.  He states sites both look good with no active bleeding.  He will continue to monitor and call back if either site appears to be bleeding.  Pt thanked nurse for call back.

## 2020-07-29 NOTE — Telephone Encounter (Signed)
Patient calling to post ablation due to concern of bleeding. Patient reports right side has been draining and leaking a lot. Patient did look into it further and found that the gauze had moved from the puncture side. Patient tried to slide new gauze into bandage without removing it due to the instructions stating to not remove it for 2 weeks. Patient is wanting to know if this is okay or if something else should be done   Please advise

## 2020-08-03 ENCOUNTER — Telehealth: Payer: Self-pay | Admitting: Cardiology

## 2020-08-03 NOTE — Telephone Encounter (Signed)
Patient was at the dentist office for a routine cleaning and dentist office called this office to see if this is okay to proceed. Patient had an ablation on 07/23/20.  Per Dr. Lovena Le it will be okay to proceed.  This information was given to the dental office.

## 2020-08-03 NOTE — Telephone Encounter (Signed)
Missy calling from [facility], wanting to find out if Timothy Wells is ok'd to have his teeth cleaned today following procedure

## 2020-08-19 ENCOUNTER — Other Ambulatory Visit: Payer: Self-pay

## 2020-08-19 ENCOUNTER — Ambulatory Visit (HOSPITAL_COMMUNITY)
Admit: 2020-08-19 | Discharge: 2020-08-19 | Disposition: A | Discharge status: Home / Self Care | Payer: PPO | Source: Ambulatory Visit | Attending: Nurse Practitioner | Admitting: Nurse Practitioner

## 2020-08-19 ENCOUNTER — Encounter (HOSPITAL_COMMUNITY): Payer: Self-pay | Admitting: Nurse Practitioner

## 2020-08-19 VITALS — BP 140/76 | HR 65 | Ht 67.0 in | Wt 173.4 lb

## 2020-08-19 DIAGNOSIS — I4819 Other persistent atrial fibrillation: Secondary | ICD-10-CM | POA: Diagnosis not present

## 2020-08-19 DIAGNOSIS — Z79899 Other long term (current) drug therapy: Secondary | ICD-10-CM | POA: Insufficient documentation

## 2020-08-19 DIAGNOSIS — D6869 Other thrombophilia: Secondary | ICD-10-CM

## 2020-08-19 DIAGNOSIS — I4891 Unspecified atrial fibrillation: Secondary | ICD-10-CM | POA: Diagnosis not present

## 2020-08-19 DIAGNOSIS — Z7901 Long term (current) use of anticoagulants: Secondary | ICD-10-CM | POA: Insufficient documentation

## 2020-08-19 MED ORDER — AMIODARONE HCL 200 MG PO TABS: 200.0000 mg | ORAL_TABLET | Freq: Every day | ORAL | 3 refills | Status: DC

## 2020-08-19 NOTE — Addendum Note (Signed)
Encounter addended by: Juluis Mire, RN on: 08/19/2020 12:15 PM  Actions taken: Order list changed

## 2020-08-19 NOTE — Progress Notes (Signed)
Primary Care Physician: Toni Arthurs, NP Referring Physician: Dr. Quentin Ore Cardiologist: Dr. Elayne Guerin Timothy Wells is a 70 y.o. male with a h/o afib, DM, CVA, anemia, memory loss, bradycardia, hyperlipidemia. Use of amiodarone, and prior CV in March of this year. Due to potential side effects from amiodarone and his relative young age for long term use, he had an ablation with Dr. Quentin Ore 5/27. He is now in the afib clinic, one month f/u ablation. EKG today shows SR. He feels improved.  He is staying in [place]. There is a question of he is on 400 mg amiodarone vrs 200 mg daily. Med sheet  says 400 mg daily. He is taking only one tab one a day. He will go home and  look at RX bottle and call back to confirm. At this point 200 mg amiodarone would be preferred dose to prevent S. E. 's of drug. No swallowing or groin issues. Is being compliant with anticoagulation.   Today, he denies symptoms of palpitations, chest pain, shortness of breath, orthopnea, PND, lower extremity edema, dizziness, presyncope, syncope, or neurologic sequela. The patient is tolerating medications without difficulties and is otherwise without complaint today.   Past Medical History:  Diagnosis Date   A-fib ([place])    Anemia    Diabetes mellitus without complication (HCC)    type 2   Dysrhythmia    wide complex tachycardia   Edema of both lower legs    Memory loss    Restless leg syndrome    Skin cancer    skin cancer face basal cell   Stroke ([place]) 2019   Past Surgical History:  Procedure Laterality Date   ATRIAL FIBRILLATION ABLATION N/A 07/23/2020   Procedure: ATRIAL FIBRILLATION ABLATION;  Surgeon: Vickie Epley, MD;  Location: [facility];  Service: Cardiovascular;  Laterality: N/A;   CARDIOVERSION N/A 03/25/2019   Procedure: CARDIOVERSION;  Surgeon: Yolonda Kida, MD;  Location: [facility];  Service: Cardiovascular;  Laterality: N/A;   CARDIOVERSION N/A 05/18/2020   Procedure: CARDIOVERSION;   Surgeon: Yolonda Kida, MD;  Location: [facility] OR[facility];  Service: Cardiovascular;  Laterality: N/A;   COLONOSCOPY WITH PROPOFOL N/A 09/10/2019   Procedure: COLONOSCOPY WITH PROPOFOL;  Surgeon: Robert Bellow, MD;  Location: [facility] ENDOSCOPY;  Service: Endoscopy;  Laterality: N/A;   SHOULDER ARTHROSCOPY WITH SUBACROMIAL DECOMPRESSION AND OPEN ROTATOR C Left 03/08/2020   Procedure: Left shoulder arthroscopic subscapularis repair and biodegradable balloon spacer placement with partial infraspinatus repair;  Surgeon: Leim Fabry, MD;  Location: [facility];  Service: Orthopedics;  Laterality: Left;   TEE WITHOUT CARDIOVERSION N/A 07/23/2020   Procedure: TRANSESOPHAGEAL ECHOCARDIOGRAM (TEE);  Surgeon: Jerline Pain, MD;  Location: [facility] ENDOSCOPY;  Service: Cardiovascular;  Laterality: N/A;    Current Outpatient Medications  Medication Sig Dispense Refill   acetaminophen (TYLENOL) 500 MG tablet Take 1,000 mg by mouth every 6 (six) hours as needed for mild pain or headache.     amiodarone (PACERONE) 400 MG tablet Take 1 tablet (400 mg total) by mouth 2 (two) times daily. (Patient taking differently: Take 400 mg by mouth daily.) 60 tablet 1   apixaban (ELIQUIS) 5 MG TABS tablet Take 1 tablet (5 mg total) by mouth 2 (two) times daily. 60 tablet    atenolol (TENORMIN) 25 MG tablet Take 25 mg by mouth daily.     atorvastatin (LIPITOR) 80 MG tablet Take 80 mg by mouth every evening.      Cholecalciferol (VITAMIN  D) 50 MCG (2000 UT) CAPS Take 4,000 Units by mouth daily.     Chromium-Cinnamon (CINNAMON PLUS CHROMIUM PO) Take 1 tablet by mouth 2 (two) times daily. 500 mg / 100 mg     CRANBERRY EXTRACT PO Take 500 mg by mouth daily.     diltiazem (TIAZAC) 360 MG 24 hr capsule Take 360 mg by mouth daily.     Ginkgo Biloba 60 MG TABS Take 60 mg by mouth daily.     Glucosamine-Chondroitin (COSAMIN DS PO) Take 1 tablet by mouth 2 (two) times daily.     lisinopril (ZESTRIL) 10 MG tablet Take 10 mg by mouth  daily.     magnesium oxide (MAG-OX) 400 (241.3 Mg) MG tablet Take 1 tablet (400 mg total) by mouth daily. 30 tablet 0   Multiple Vitamin (MULTIVITAMIN WITH MINERALS) TABS tablet Take 1 tablet by mouth daily.     Omega-3 Fatty Acids (FISH OIL) 1000 MG CAPS Take 1,000 mg by mouth daily. DHA     omeprazole (PRILOSEC) 20 MG capsule Take 20 mg by mouth daily.     OVER THE COUNTER MEDICATION Take 5 mLs by mouth daily. Super Beets power     pantoprazole (PROTONIX) 40 MG tablet Take 1 tablet (40 mg total) by mouth daily. 45 tablet 0   rOPINIRole (REQUIP) 1 MG tablet Take 1 mg by mouth at bedtime.     tamsulosin (FLOMAX) 0.4 MG CAPS capsule Take 0.4 mg by mouth at bedtime.     Turmeric Curcumin 500 MG CAPS Take 500 mg by mouth 2 (two) times daily.     vitamin B-12 (CYANOCOBALAMIN) 1000 MCG tablet Take 1,000 mcg by mouth daily.     vitamin C (ASCORBIC ACID) 500 MG tablet Take 500 mg by mouth daily.     vitamin E 400 UNIT capsule Take 400 Units by mouth daily.     Zinc 50 MG TABS Take 50 mg by mouth daily.     No current facility-administered medications for this encounter.    No Known Allergies  Social History   Socioeconomic History   Marital status: Single    Spouse name: Not on file   Number of children: Not on file   Years of education: Not on file   Highest education level: Not on file  Occupational History   Not on file  Tobacco Use   Smoking status: Never   Smokeless tobacco: Never  Vaping Use   Vaping Use: Never used  Substance and Sexual Activity   Alcohol use: Yes    Comment: rare   Drug use: Not Currently   Sexual activity: Not on file  Other Topics Concern   Not on file  Social History Narrative   Not on file   Social Determinants of Health   Financial Resource Strain: Not on file  Food Insecurity: Not on file  Transportation Needs: Not on file  Physical Activity: Not on file  Stress: Not on file  Social Connections: Not on file  Intimate Partner Violence: Not  on file    No family history on file.  ROS- All systems are reviewed and negative except as per the HPI above  Physical Exam: Vitals:   08/19/20 0929  Weight: 78.7 kg  Height: 5\' 7"  (1.702 m)   Wt Readings from Last 3 Encounters:  08/19/20 78.7 kg  07/23/20 77.1 kg  06/16/20 76.7 kg    Labs: Lab Results  Component Value Date   NA 133 (L) 07/14/2020  K 4.4 07/14/2020   CL 100 07/14/2020   CO2 25 07/14/2020   GLUCOSE 119 (H) 07/14/2020   BUN 25 (H) 07/14/2020   CREATININE 0.83 07/14/2020   CALCIUM 8.7 (L) 07/14/2020   MG 2.0 01/21/2019   Lab Results  Component Value Date   INR 1.1 01/21/2019   No results found for: CHOL, HDL, LDLCALC, TRIG   GEN- The patient is well appearing, alert and oriented x 3 today.   Head- normocephalic, atraumatic Eyes-  Sclera clear, conjunctiva pink Ears- hearing intact Oropharynx- clear Neck- supple, no JVP Lymph- no cervical lymphadenopathy Lungs- Clear to ausculation bilaterally, normal work of breathing Heart- Regular rate and rhythm, no murmurs, rubs or gallops, PMI not laterally displaced GI- soft, NT, ND, + BS Extremities- no clubbing, cyanosis, or edema MS- no significant deformity or atrophy Skin- no rash or lesion Psych- euthymic mood, full affect Neuro- strength and sensation are intact  EKG-NSR at 65 bpm, pr int 192 ms, qrs int 124 ms, qtc 453 ms     Assessment and Plan:  1. Afib S/p ablation and maintaining  SR There is a question of if he is on 400 mg daily or 200 mg daily of amiodarone  He will call back to office to confirm dose At this point, to prevent side effects from the drug, the preferred daily dose is 200 mg daily   2. CHA2DS2VASc  score of 3 Continue eliquis 5 mg bid  Reminded not to interrupt anticoagulation for the 3 month recovery center   F/u with Dr. Quentin Ore 10/27/20  Geroge Baseman. Elye Harmsen, [facility] [address] [place], [place] [address] [phone]

## 2020-09-05 ENCOUNTER — Other Ambulatory Visit: Payer: Self-pay | Admitting: Cardiology

## 2020-09-30 DIAGNOSIS — R600 Localized edema: Secondary | ICD-10-CM | POA: Diagnosis not present

## 2020-09-30 DIAGNOSIS — R002 Palpitations: Secondary | ICD-10-CM | POA: Diagnosis not present

## 2020-09-30 DIAGNOSIS — E119 Type 2 diabetes mellitus without complications: Secondary | ICD-10-CM | POA: Diagnosis not present

## 2020-09-30 DIAGNOSIS — R030 Elevated blood-pressure reading, without diagnosis of hypertension: Secondary | ICD-10-CM | POA: Diagnosis not present

## 2020-09-30 DIAGNOSIS — E782 Mixed hyperlipidemia: Secondary | ICD-10-CM | POA: Diagnosis not present

## 2020-09-30 DIAGNOSIS — Z8679 Personal history of other diseases of the circulatory system: Secondary | ICD-10-CM | POA: Diagnosis not present

## 2020-09-30 DIAGNOSIS — Z9889 Other specified postprocedural states: Secondary | ICD-10-CM | POA: Diagnosis not present

## 2020-09-30 DIAGNOSIS — Z8673 Personal history of transient ischemic attack (TIA), and cerebral infarction without residual deficits: Secondary | ICD-10-CM | POA: Diagnosis not present

## 2020-10-13 ENCOUNTER — Other Ambulatory Visit: Payer: Self-pay | Admitting: Orthopedic Surgery

## 2020-10-18 DIAGNOSIS — M25512 Pain in left shoulder: Secondary | ICD-10-CM | POA: Diagnosis not present

## 2020-10-22 ENCOUNTER — Telehealth: Payer: Self-pay | Admitting: *Deleted

## 2020-10-22 NOTE — Telephone Encounter (Signed)
   [facility] Pre-operative Risk Assessment    Patient Name: Timothy Wells  DOB: 28-Jul-1950 MRN: [id number]  HEARTCARE STAFF:  - IMPORTANT!!!!!! Under Visit Info/Reason for Call, type in Other and utilize the format Clearance MM/DD/YY or Clearance TBD. Do not use dashes or single digits. - Please review there is not already an duplicate clearance open for this procedure. - If request is for dental extraction, please clarify the # of teeth to be extracted. - If the patient is currently at the dentist's office, call Pre-Op Callback Staff (MA/nurse) to input urgent request.  - If the patient is not currently in the dentist office, please route to the Pre-Op pool.  Request for surgical clearance: PT'S GENERAL CARDIOLOGIST IS DR. CALLWOOD; [place] DR. CALLWOOD IS ASKING FOR INPUT FROM DR. LAMBERT IN REGARDS TO ELIQUIS RECOMMENDATIONS. SEE LINE # [phone]  What type of surgery is being performed? LEFT REVERSE SHOULDER ARTHROPLASTY  When is this surgery scheduled? TBD  What type of clearance is required (medical clearance vs. Pharmacy clearance to hold med vs. Both)? PHARM  Are there any medications that need to be held prior to surgery and how long? ELIQUIS (PER DR. CALLWOOD HE RECOMMENDS HOLDING ELIQUIS x 5 DAYS THOUGH WOULD LIKE INPUT FROM DR. LAMBERT TO CONFIRM CLEARANCE AS WELL)  Practice name and name of physician performing surgery? [facility]; DR. Lennette Bihari SUPPLE  What is the office phone number? [phone]   7.   What is the office fax number? [phone] ATTN: [facility]  8.   Anesthesia type (None, local, MAC, general) ? CHOICE   [name] 10/22/2020, 10:59 AM  _________________________________________________________________   (provider comments below)

## 2020-10-25 NOTE — Telephone Encounter (Signed)
Please see note below. Patient's primary Cardiologist is Dr. Clayborn Bigness at [facility]. However, Dr. Clayborn Bigness would like Dr. Mardene Speak input on recommendations for holding Eliquis. Will await Dr. Mardene Speak recommendations. Will leave medical clearance to patient's primary Cardiologist.   Darreld Mclean, PA-C 10/25/2020 8:19 AM

## 2020-10-26 MED ORDER — APIXABAN 5 MG PO TABS: 5.0000 mg | ORAL_TABLET | Freq: Two times a day (BID) | ORAL | 5 refills | Status: DC

## 2020-10-26 NOTE — Telephone Encounter (Signed)
Patient with diagnosis of afib on Eliquis for anticoagulation.    Procedure: left reverse shoulder arthroplasty Date of procedure: TBD  CHA2DS2-VASc Score = 5  This indicates a 7.2% annual risk of stroke. The patient's score is based upon: CHF History: No HTN History: Yes Diabetes History: Yes Stroke History: Yes Vascular Disease History: No Age Score: 1 Gender Score: 0   Underwent afib ablation 07/23/20  CrCl 18m/min Platelet count 245K  Do not need 5 day hold for DOAC washout. Typically hold Eliquis for 3 days prior to shoulder arthroplasty, however pt is at elevated CV risk given hx of afib and stroke. Will defer to MD for input.

## 2020-10-26 NOTE — Addendum Note (Signed)
Addended by: Kewana Sanon E on: 10/26/2020 02:37 PM   Modules accepted: Orders

## 2020-10-27 ENCOUNTER — Encounter: Payer: Self-pay | Admitting: Cardiology

## 2020-10-27 ENCOUNTER — Ambulatory Visit: Payer: PPO | Admitting: Cardiology

## 2020-10-27 ENCOUNTER — Other Ambulatory Visit: Payer: Self-pay

## 2020-10-27 VITALS — BP 110/60 | HR 65 | Ht 66.0 in | Wt 172.0 lb

## 2020-10-27 DIAGNOSIS — I4819 Other persistent atrial fibrillation: Secondary | ICD-10-CM | POA: Diagnosis not present

## 2020-10-27 DIAGNOSIS — I10 Essential (primary) hypertension: Secondary | ICD-10-CM | POA: Diagnosis not present

## 2020-10-27 MED ORDER — DILTIAZEM HCL ER COATED BEADS 240 MG PO CP24: 240.0000 mg | ORAL_CAPSULE | Freq: Every day | ORAL | 3 refills | Status: DC

## 2020-10-27 NOTE — Progress Notes (Signed)
Electrophysiology Office Follow up Visit Note:    Date:  10/27/2020   ID:  Timothy Wells, DOB [birth year], MRN [id number]  PCP:  Toni Arthurs, NP  [facility] Cardiologist:  None  [facility] Electrophysiologist:  Vickie Epley, MD    Interval History:    Timothy Wells is a 70 y.o. male who presents for a follow up visit after a successful atrial fibrillation ablation on Jul 23, 2020.  During the ablation the pulmonary veins and posterior wall were isolated.  He has not had a recurrence of his arrhythmia since the ablation procedure.  He has an upcoming left reverse shoulder arthroplasty scheduled.     Past Medical History:  Diagnosis Date   A-fib ([place])    Anemia    Diabetes mellitus without complication (HCC)    type 2   Dysrhythmia    wide complex tachycardia   Edema of both lower legs    Memory loss    Restless leg syndrome    Skin cancer    skin cancer face basal cell   Stroke ([place]) 2019    Past Surgical History:  Procedure Laterality Date   ATRIAL FIBRILLATION ABLATION N/A 07/23/2020   Procedure: ATRIAL FIBRILLATION ABLATION;  Surgeon: Vickie Epley, MD;  Location: [place] [facility];  Service: Cardiovascular;  Laterality: N/A;   CARDIOVERSION N/A 03/25/2019   Procedure: CARDIOVERSION;  Surgeon: Yolonda Kida, MD;  Location: [facility] ORS;  Service: Cardiovascular;  Laterality: N/A;   CARDIOVERSION N/A 05/18/2020   Procedure: CARDIOVERSION;  Surgeon: Yolonda Kida, MD;  Location: [facility] ORS;  Service: Cardiovascular;  Laterality: N/A;   COLONOSCOPY WITH PROPOFOL N/A 09/10/2019   Procedure: COLONOSCOPY WITH PROPOFOL;  Surgeon: Robert Bellow, MD;  Location: [facility] ENDOSCOPY;  Service: Endoscopy;  Laterality: N/A;   SHOULDER ARTHROSCOPY WITH SUBACROMIAL DECOMPRESSION AND OPEN ROTATOR C Left 03/08/2020   Procedure: Left shoulder arthroscopic subscapularis repair and biodegradable balloon spacer placement with partial infraspinatus repair;  Surgeon:  Leim Fabry, MD;  Location: [place];  Service: Orthopedics;  Laterality: Left;   TEE WITHOUT CARDIOVERSION N/A 07/23/2020   Procedure: TRANSESOPHAGEAL ECHOCARDIOGRAM (TEE);  Surgeon: Jerline Pain, MD;  Location: [facility] ENDOSCOPY;  Service: Cardiovascular;  Laterality: N/A;    Current Medications: Current Meds  Medication Sig   acetaminophen (TYLENOL) 500 MG tablet Take 1,000 mg by mouth every 6 (six) hours as needed for mild pain or headache.   apixaban (ELIQUIS) 5 MG TABS tablet Take 5 mg by mouth 2 (two) times daily.   atenolol (TENORMIN) 25 MG tablet Take 25 mg by mouth daily.   atorvastatin (LIPITOR) 80 MG tablet Take 80 mg by mouth every evening.    Cholecalciferol (VITAMIN D) 50 MCG (2000 UT) CAPS Take 4,000 Units by mouth daily.   Chromium-Cinnamon (CINNAMON PLUS CHROMIUM PO) Take 1 tablet by mouth 2 (two) times daily. 500 mg / 100 mg   CRANBERRY EXTRACT PO Take 500 mg by mouth daily.   diltiazem (CARDIZEM CD) 240 MG 24 hr capsule Take 1 capsule (240 mg total) by mouth daily.   Ginkgo Biloba 60 MG TABS Take 60 mg by mouth daily.   Glucosamine-Chondroitin (COSAMIN DS PO) Take 1 tablet by mouth 2 (two) times daily.   lisinopril (ZESTRIL) 10 MG tablet Take 10 mg by mouth daily.   magnesium oxide (MAG-OX) 400 (241.3 Mg) MG tablet Take 1 tablet (400 mg total) by mouth daily.   Multiple Vitamin (MULTIVITAMIN WITH MINERALS) TABS tablet Take  1 tablet by mouth daily.   Omega-3 Fatty Acids (FISH OIL) 1000 MG CAPS Take 1,000 mg by mouth daily. DHA   omeprazole (PRILOSEC) 20 MG capsule Take 20 mg by mouth daily.   OVER THE COUNTER MEDICATION Take 5 mLs by mouth daily. Super Beets power   pantoprazole (PROTONIX) 40 MG tablet Take 1 tablet by mouth once daily   rOPINIRole (REQUIP) 1 MG tablet Take 1 mg by mouth at bedtime.   tamsulosin (FLOMAX) 0.4 MG CAPS capsule Take 0.4 mg by mouth at bedtime.   Turmeric Curcumin 500 MG CAPS Take 500 mg by mouth 2 (two) times daily.   vitamin B-12  (CYANOCOBALAMIN) 1000 MCG tablet Take 1,000 mcg by mouth daily.   vitamin C (ASCORBIC ACID) 500 MG tablet Take 500 mg by mouth daily.   vitamin E 400 UNIT capsule Take 400 Units by mouth daily.   Zinc 50 MG TABS Take 50 mg by mouth daily.   [DISCONTINUED] amiodarone (PACERONE) 400 MG tablet Take 1/4 tablet by mouth daily.   [DISCONTINUED] diltiazem (TIAZAC) 360 MG 24 hr capsule Take 360 mg by mouth daily.     Allergies:   Patient has no known allergies.   Social History   Socioeconomic History   Marital status: Single    Spouse name: Not on file   Number of children: Not on file   Years of education: Not on file   Highest education level: Not on file  Occupational History   Not on file  Tobacco Use   Smoking status: Never   Smokeless tobacco: Never  Vaping Use   Vaping Use: Never used  Substance and Sexual Activity   Alcohol use: Yes    Comment: rare   Drug use: Not Currently   Sexual activity: Not on file  Other Topics Concern   Not on file  Social History Narrative   Not on file   Social Determinants of Health   Financial Resource Strain: Not on file  Food Insecurity: Not on file  Transportation Needs: Not on file  Physical Activity: Not on file  Stress: Not on file  Social Connections: Not on file     Family History: The patient's family history is not on file.  ROS:   Please see the history of present illness.    All other systems reviewed and are negative.  EKGs/Labs/Other Studies Reviewed:    The following studies were reviewed today: Prior notes, ablation records  EKG:  The ekg ordered today demonstrates normal sinus rhythm  Recent Labs: 07/14/2020: BUN 25; Creatinine, Ser 0.83; Hemoglobin 11.7; Platelets 245; Potassium 4.4; Sodium 133  Recent Lipid Panel No results found for: CHOL, TRIG, HDL, CHOLHDL, VLDL, LDLCALC, LDLDIRECT  Physical Exam:    VS:  BP 110/60 (BP Location: Left Arm, Patient Position: Sitting, Cuff Size: Normal)   Pulse 65    Ht '5\' 6"'$  (1.676 m)   Wt 172 lb (78 kg)   BMI 27.76 kg/m     Wt Readings from Last 3 Encounters:  10/27/20 172 lb (78 kg)  08/19/20 173 lb 6.4 oz (78.7 kg)  07/23/20 170 lb (77.1 kg)     GEN:  Well nourished, well developed in no acute distress HEENT: Normal NECK: No JVD; No carotid bruits LYMPHATICS: No lymphadenopathy CARDIAC: RRR, no murmurs, rubs, gallops RESPIRATORY:  Clear to auscultation without rales, wheezing or rhonchi  ABDOMEN: Soft, non-tender, non-distended MUSCULOSKELETAL:  No edema; No deformity  SKIN: Warm and dry NEUROLOGIC:  Alert and  oriented x 3 PSYCHIATRIC:  Normal affect   ASSESSMENT:    1. Persistent atrial fibrillation ([place])   2. Primary hypertension    PLAN:    In order of problems listed above:    1. Persistent atrial fibrillation (HCC) Maintaining sinus rhythm after his Jul 23, 2020 atrial fibrillation ablation.  I would like him to stop his amiodarone.  We will decrease the dose of his diltiazem to 240 mg by mouth once daily.  He will continue his atenolol.  I would like him to continue his Eliquis lifelong.  It is okay for him to interrupt his anticoagulation for 2 to 3 days prior to the scheduled orthopedic surgery.  They should restart his anticoagulation when felt safe from a surgical perspective.  2. Primary hypertension Controlled   Follow-up 9 months   Medication Adjustments/Labs and Tests Ordered: Current medicines are reviewed at length with the patient today.  Concerns regarding medicines are outlined above.  Orders Placed This Encounter  Procedures   EKG 12-Lead   Meds ordered this encounter  Medications   diltiazem (CARDIZEM CD) 240 MG 24 hr capsule    Sig: Take 1 capsule (240 mg total) by mouth daily.    Dispense:  90 capsule    Refill:  3    Dose decrease     Signed, Lars Mage, MD, [facility], [facility] 10/27/2020 10:59 AM    Electrophysiology [facility]

## 2020-10-27 NOTE — Patient Instructions (Signed)
Medication Instructions:  - Your physician has recommended you make the following change in your medication:   1) STOP amiodarone  2) DECREASE diltiazem to 240 mg- take 1 capsule by mouth once daily   *If you need a refill on your cardiac medications before your next appointment, please call your pharmacy*   Lab Work: - none ordered  If you have labs (blood work) drawn today and your tests are completely normal, you will receive your results only by: [place] (if you have MyChart) OR A paper copy in the mail If you have any lab test that is abnormal or we need to change your treatment, we will call you to review the results.   Testing/Procedures: - none ordered   Follow-Up: At [facility], you and your health needs are our priority.  As part of our continuing mission to provide you with exceptional heart care, we have created designated Provider Care Teams.  These Care Teams include your primary Cardiologist (physician) and Advanced Practice Providers (APPs -  Physician Assistants and Nurse Practitioners) who all work together to provide you with the care you need, when you need it.  We recommend signing up for the patient portal called "MyChart".  Sign up information is provided on this After Visit Summary.  MyChart is used to connect with patients for Virtual Visits (Telemedicine).  Patients are able to view lab/test results, encounter notes, upcoming appointments, etc.  Non-urgent messages can be sent to your provider as well.   To learn more about what you can do with MyChart, go to [email or web address].    Your next appointment:   9 month(s)  The format for your next appointment:   In Person  Provider:   Lars Mage, MD   Other Instructions N/a

## 2020-10-28 ENCOUNTER — Other Ambulatory Visit: Payer: PPO

## 2020-11-04 ENCOUNTER — Other Ambulatory Visit: Payer: PPO

## 2020-11-08 ENCOUNTER — Inpatient Hospital Stay: Admit: 2020-11-08 | Payer: PPO | Admitting: Orthopedic Surgery

## 2020-11-08 SURGERY — ARTHROPLASTY, SHOULDER, TOTAL, REVERSE
Anesthesia: Choice | Site: Shoulder | Laterality: Left

## 2020-11-15 ENCOUNTER — Telehealth (HOSPITAL_COMMUNITY): Payer: Self-pay | Admitting: *Deleted

## 2020-11-15 NOTE — Telephone Encounter (Signed)
Patient called in stating he went into AF this morning HR was 100-105. Pt is still on cardizem '360mg'$  a day per Adline Peals PA will not make medication changes and bring in for assessment. Pt states he stopped amiodarone about 3 weeks ago per Dr. Quentin Ore.

## 2020-11-16 NOTE — Telephone Encounter (Signed)
Covering preop today, awaiting reply as below. I see surgical date tentatively set at 12/09/20. Patient has since seen Dr. Quentin Ore on 10/27/20 at which time amiodarone was stopped and diltiazem was decreased. Per phone note yesterday from afib clinic, patient went back into AF HR 100-105. Patient has OV with Adline Peals tomorrow in afib clinic. Will route this message to Dr. Quentin Ore and Audry Pili for input on updated surgical recommendations since he is actively being managed for this issue. Audry Pili indicated previously he does not do surgical clearance but at this time would suggest he speak with Dr. Quentin Ore after the visit to finalize recommendations to the preop team regarding anticoagulation recommendations given recent OVs. (Since patient is followed by Dr. Clayborn Bigness, Dr. Quentin Ore and AFib clinic, would not necessarily make sense to involve a 4th party in the decision making.)  Please route reply to P CV DIV PREOP. Thank you.

## 2020-11-17 ENCOUNTER — Encounter (HOSPITAL_COMMUNITY): Payer: Self-pay | Admitting: Physician Assistant

## 2020-11-17 ENCOUNTER — Other Ambulatory Visit: Payer: Self-pay

## 2020-11-17 ENCOUNTER — Ambulatory Visit (HOSPITAL_COMMUNITY)
Admission: RE | Admit: 2020-11-17 | Discharge: 2020-11-17 | Disposition: A | Discharge status: Home / Self Care | Payer: PPO | Source: Ambulatory Visit | Attending: Physician Assistant | Admitting: Physician Assistant

## 2020-11-17 VITALS — BP 116/74 | HR 104 | Ht 66.0 in | Wt 171.4 lb

## 2020-11-17 DIAGNOSIS — D6869 Other thrombophilia: Secondary | ICD-10-CM | POA: Diagnosis not present

## 2020-11-17 DIAGNOSIS — I10 Essential (primary) hypertension: Secondary | ICD-10-CM | POA: Diagnosis not present

## 2020-11-17 DIAGNOSIS — I4892 Unspecified atrial flutter: Secondary | ICD-10-CM | POA: Insufficient documentation

## 2020-11-17 DIAGNOSIS — I484 Atypical atrial flutter: Secondary | ICD-10-CM | POA: Diagnosis not present

## 2020-11-17 DIAGNOSIS — I4819 Other persistent atrial fibrillation: Secondary | ICD-10-CM | POA: Insufficient documentation

## 2020-11-17 DIAGNOSIS — Z7901 Long term (current) use of anticoagulants: Secondary | ICD-10-CM | POA: Diagnosis not present

## 2020-11-17 DIAGNOSIS — Z79899 Other long term (current) drug therapy: Secondary | ICD-10-CM | POA: Insufficient documentation

## 2020-11-17 LAB — BASIC METABOLIC PANEL
Anion gap: 10 (ref 5–15)
BUN: 20 mg/dL (ref 8–23)
CO2: 23 mmol/L (ref 22–32)
Calcium: 8.8 mg/dL — ABNORMAL LOW (ref 8.9–10.3)
Chloride: 99 mmol/L (ref 98–111)
Creatinine, Ser: 0.9 mg/dL (ref 0.61–1.24)
GFR, Estimated: >60 mL/min (ref >60)
Glucose, Bld: 112 mg/dL — ABNORMAL HIGH (ref 70–99)
Potassium: 4.4 mmol/L (ref 3.5–5.1)
Sodium: 132 mmol/L — ABNORMAL LOW (ref 135–145)

## 2020-11-17 LAB — CBC
HCT: 37.5 % — ABNORMAL LOW (ref 39.0–52.0)
Hemoglobin: 13.1 g/dL (ref 13.0–17.0)
MCH: 33 pg (ref 26.0–34.0)
MCHC: 34.9 g/dL (ref 30.0–36.0)
MCV: 94.5 fL (ref 80.0–100.0)
Platelets: 242 10*3/uL (ref 150–400)
RBC: 3.97 MIL/uL — ABNORMAL LOW (ref 4.22–5.81)
RDW: 13.1 % (ref 11.5–15.5)
WBC: 5.8 10*3/uL (ref 4.0–10.5)
nRBC: 0 % (ref 0.0–0.2)

## 2020-11-17 MED ORDER — DILTIAZEM HCL ER COATED BEADS 360 MG PO CP24: 360.0000 mg | ORAL_CAPSULE | Freq: Every day | ORAL | 1 refills | Status: DC

## 2020-11-17 NOTE — Progress Notes (Signed)
Primary Care Physician: Toni Arthurs, NP Referring Physician: Dr. Quentin Ore Cardiologist: Dr. Elayne Guerin Timothy Wells is a 70 y.o. male with a h/o afib, DM, CVA, anemia, memory loss, bradycardia, hyperlipidemia. Use of amiodarone, and prior CV in March 2022. Due to potential side effects from amiodarone and his relative young age for long term use, he had an ablation with Dr. Quentin Ore 5/27.   On follow up today, patient reports that he started having an elevated heart rate on 11/15/20. There were no specific triggers identified. He did start a CBD supplement about two weeks ago, not clear that this would cause him to go out of rhythm. He does report symptoms of dizziness. He denies any bleeding issues on anticoagulation.   Today, he denies symptoms of palpitations, chest pain, shortness of breath, orthopnea, PND, lower extremity edema, presyncope, syncope, or neurologic sequela. The patient is tolerating medications without difficulties and is otherwise without complaint today.   Past Medical History:  Diagnosis Date   A-fib ([place])    Anemia    Diabetes mellitus without complication (HCC)    type 2   Dysrhythmia    wide complex tachycardia   Edema of both lower legs    Memory loss    Restless leg syndrome    Skin cancer    skin cancer face basal cell   Stroke ([place]) 2019   Past Surgical History:  Procedure Laterality Date   ATRIAL FIBRILLATION ABLATION N/A 07/23/2020   Procedure: ATRIAL FIBRILLATION ABLATION;  Surgeon: Vickie Epley, MD;  Location: [place] [facility];  Service: Cardiovascular;  Laterality: N/A;   CARDIOVERSION N/A 03/25/2019   Procedure: CARDIOVERSION;  Surgeon: Yolonda Kida, MD;  Location: [facility] ORS;  Service: Cardiovascular;  Laterality: N/A;   CARDIOVERSION N/A 05/18/2020   Procedure: CARDIOVERSION;  Surgeon: Yolonda Kida, MD;  Location: [facility] ORS;  Service: Cardiovascular;  Laterality: N/A;   COLONOSCOPY WITH PROPOFOL N/A 09/10/2019   Procedure:  COLONOSCOPY WITH PROPOFOL;  Surgeon: Robert Bellow, MD;  Location: [facility] ENDOSCOPY;  Service: Endoscopy;  Laterality: N/A;   SHOULDER ARTHROSCOPY WITH SUBACROMIAL DECOMPRESSION AND OPEN ROTATOR C Left 03/08/2020   Procedure: Left shoulder arthroscopic subscapularis repair and biodegradable balloon spacer placement with partial infraspinatus repair;  Surgeon: Leim Fabry, MD;  Location: [place];  Service: Orthopedics;  Laterality: Left;   TEE WITHOUT CARDIOVERSION N/A 07/23/2020   Procedure: TRANSESOPHAGEAL ECHOCARDIOGRAM (TEE);  Surgeon: Jerline Pain, MD;  Location: [facility] ENDOSCOPY;  Service: Cardiovascular;  Laterality: N/A;    Current Outpatient Medications  Medication Sig Dispense Refill   acetaminophen (TYLENOL) 500 MG tablet Take 1,000 mg by mouth every 6 (six) hours as needed for mild pain or headache.     apixaban (ELIQUIS) 5 MG TABS tablet Take 5 mg by mouth 2 (two) times daily.     atenolol (TENORMIN) 25 MG tablet Take 25 mg by mouth daily.     atorvastatin (LIPITOR) 80 MG tablet Take 80 mg by mouth every evening.      Cholecalciferol (VITAMIN D) 50 MCG (2000 UT) CAPS Take 4,000 Units by mouth daily.     Chromium-Cinnamon (CINNAMON PLUS CHROMIUM PO) Take 1 tablet by mouth 2 (two) times daily. 500 mg / 100 mg     CRANBERRY EXTRACT PO Take 500 mg by mouth daily.     Ginkgo Biloba 60 MG TABS Take 60 mg by mouth daily.     Glucosamine-Chondroitin (COSAMIN DS PO) Take 1 tablet by mouth  2 (two) times daily.     lisinopril (ZESTRIL) 10 MG tablet Take 10 mg by mouth daily.     magnesium oxide (MAG-OX) 400 (241.3 Mg) MG tablet Take 1 tablet (400 mg total) by mouth daily. 30 tablet 0   Multiple Vitamin (MULTIVITAMIN WITH MINERALS) TABS tablet Take 1 tablet by mouth daily.     Omega-3 Fatty Acids (FISH OIL) 1000 MG CAPS Take 1,000 mg by mouth daily. DHA     omeprazole (PRILOSEC) 20 MG capsule Take 20 mg by mouth daily.     OVER THE COUNTER MEDICATION Take 5 mLs by mouth daily.  Super Beets power     pantoprazole (PROTONIX) 40 MG tablet Take 1 tablet by mouth once daily 90 tablet 3   rOPINIRole (REQUIP) 1 MG tablet Take 1 mg by mouth at bedtime.     tamsulosin (FLOMAX) 0.4 MG CAPS capsule Take 0.4 mg by mouth at bedtime.     Turmeric Curcumin 500 MG CAPS Take 500 mg by mouth 2 (two) times daily.     UNABLE TO FIND [employer] CBD Gummy Bears 10mg  per Gummy     vitamin B-12 (CYANOCOBALAMIN) 1000 MCG tablet Take 1,000 mcg by mouth daily.     vitamin C (ASCORBIC ACID) 500 MG tablet Take 500 mg by mouth daily.     vitamin E 400 UNIT capsule Take 400 Units by mouth daily.     Zinc 50 MG TABS Take 50 mg by mouth daily.     diltiazem (CARDIZEM CD) 360 MG 24 hr capsule Take 1 capsule (360 mg total) by mouth daily. 90 capsule 1   No current facility-administered medications for this encounter.    No Known Allergies  Social History   Socioeconomic History   Marital status: Single    Spouse name: Not on file   Number of children: Not on file   Years of education: Not on file   Highest education level: Not on file  Occupational History   Not on file  Tobacco Use   Smoking status: Never   Smokeless tobacco: Never  Vaping Use   Vaping Use: Never used  Substance and Sexual Activity   Alcohol use: Yes    Comment: rare   Drug use: Not Currently   Sexual activity: Not on file  Other Topics Concern   Not on file  Social History Narrative   Not on file   Social Determinants of Health   Financial Resource Strain: Not on file  Food Insecurity: Not on file  Transportation Needs: Not on file  Physical Activity: Not on file  Stress: Not on file  Social Connections: Not on file  Intimate Partner Violence: Not on file    No family history on file.  ROS- All systems are reviewed and negative except as per the HPI above  Physical Exam: Vitals:   11/17/20 1117  BP: 116/74  Pulse: (!) 104  Weight: 77.7 kg  Height: 5\' 6"  (1.676 m)    Wt Readings from Last 3  Encounters:  11/17/20 77.7 kg  10/27/20 78 kg  08/19/20 78.7 kg    Labs: Lab Results  Component Value Date   NA 133 (L) 07/14/2020   K 4.4 07/14/2020   CL 100 07/14/2020   CO2 25 07/14/2020   GLUCOSE 119 (H) 07/14/2020   BUN 25 (H) 07/14/2020   CREATININE 0.83 07/14/2020   CALCIUM 8.7 (L) 07/14/2020   MG 2.0 01/21/2019   Lab Results  Component Value Date  INR 1.1 01/21/2019   No results found for: CHOL, HDL, LDLCALC, TRIG  GEN- The patient is a well appearing male, alert and oriented x 3 today.   HEENT-head normocephalic, atraumatic, sclera clear, conjunctiva pink, hearing intact, trachea midline. Lungs- Clear to ausculation bilaterally, normal work of breathing Heart- Regular rate and rhythm, tachycardia, no murmurs, rubs or gallops  GI- soft, NT, ND, + BS Extremities- no clubbing, cyanosis, or edema MS- no significant deformity or atrophy Skin- no rash or lesion Psych- euthymic mood, full affect Neuro- strength and sensation are intact   EKG- atrial flutter, RBBB Vent. rate 104 BPM PR interval 240 ms QRS duration 134 ms QT/QTcB 354/465 ms  CHA2DS2-VASc Score = 5  The patient's score is based upon: CHF History: 0 HTN History: 1 Diabetes History: 1 Stroke History: 2 Vascular Disease History: 0 Age Score: 1 Gender Score: 0       ASSESSMENT AND PLAN: 1. Persistent Atrial Fibrillation/atrial flutter The patient's CHA2DS2-VASc score is 5, indicating a 7.2% annual risk of stroke.   S/p ablation 07/23/20 Patient appears to be in atrial flutter today.  We discussed rhythm control options. Will plan for DCCV. Check bmet/cbc. Unfortunately, he will not be able to hold Eliquis for 4 weeks post DCCV, his shoulder surgery will need to be postponed.  Continue diltiazem 360 mg daily (he never decreased this) Continue Eliquis 5 mg BID Continue atenolol 25 mg daily  2. Secondary Hypercoagulable State (ICD10:  D68.69) The patient is at significant risk for  stroke/thromboembolism based upon his CHA2DS2-VASc Score of 5.  Continue Apixaban (Eliquis).   3. HTN Stable, no changes today.   Follow up in the AF clinic post DCCV.    [facility] [address] [place], [place] [address] [phone]

## 2020-11-17 NOTE — Patient Instructions (Addendum)
  Continue cardizem 360mg    Cardioversion scheduled for Thursday, September 29th  - Arrive at the [facility] and go to admitting at [facility] not eat or drink anything after midnight the night prior to your procedure.  - Take all your morning medication (except diabetic medications) with a sip of water prior to arrival.  - You will not be able to drive home after your procedure.  - Do NOT miss any doses of your blood thinner - if you should miss a dose please notify our office immediately.  - If you feel as if you go back into normal rhythm prior to scheduled cardioversion, please notify our office immediately. If your procedure is canceled in the cardioversion suite you will be charged a cancellation fee.  Patients will be asked to: to mask in public and hand hygiene (no longer quarantine) in the 3 days prior to surgery, to report if any COVID-19-like illness or household contacts to COVID-19 to determine need for testing

## 2020-11-17 NOTE — Telephone Encounter (Addendum)
   Patient Name: Timothy Wells  DOB: 04/12/1950 MRN: [id number]  Primary Cardiologist: Dr. Clayborn Bigness, also followed by EP - Dr. Quentin Ore and Afib clinic  Chart revisited as part of pre-operative protocol coverage. Patient was seen by Adline Peals today who routed the following Assessment and Plan:  1. Persistent Atrial Fibrillation/atrial flutter The patient's CHA2DS2-VASc score is 5, indicating a 7.2% annual risk of stroke.   S/p ablation 07/23/20 Patient appears to be in atrial flutter today.  We discussed rhythm control options. Will plan for DCCV. Check bmet/cbc. Unfortunately, he will not be able to hold Eliquis for 4 weeks post DCCV, his shoulder surgery will need to be postponed.  Continue diltiazem 360 mg daily (he never decreased this) Continue Eliquis 5 mg BID Continue atenolol 25 mg daily"  Per discussion with Audry Pili, he routed his note to the surgeon to make him aware. The patient will also follow up in the Afib clinic post DCCV at which time the timing of surgery can be revisited. I will close this message out of our preop box but asked Ricky to have their team let us know when patient plans to revisit surgery and we can re-open this clearance. I'll fax this completed note to the requesting contact below.  Charlie Pitter, PA-C 11/17/2020, 1:15 PM

## 2020-11-24 ENCOUNTER — Other Ambulatory Visit: Payer: Self-pay

## 2020-11-24 ENCOUNTER — Ambulatory Visit (HOSPITAL_COMMUNITY)
Admission: RE | Admit: 2020-11-24 | Discharge: 2020-11-24 | Disposition: A | Discharge status: Home / Self Care | Payer: PPO | Source: Ambulatory Visit | Attending: Physician Assistant | Admitting: Physician Assistant

## 2020-11-24 DIAGNOSIS — Z7901 Long term (current) use of anticoagulants: Secondary | ICD-10-CM | POA: Insufficient documentation

## 2020-11-24 DIAGNOSIS — I4891 Unspecified atrial fibrillation: Secondary | ICD-10-CM | POA: Insufficient documentation

## 2020-11-24 DIAGNOSIS — I4819 Other persistent atrial fibrillation: Secondary | ICD-10-CM

## 2020-11-24 NOTE — Telephone Encounter (Signed)
    Patient Name: Timothy Wells  DOB: [birth year] MRN: [id number]  Primary Cardiologist: Dr. Clayborn Bigness, also followed by EP - Dr. Quentin Ore and Afib clinic  Chart reviewed as part of pre-operative protocol coverage.   Patient presented for EKG 11/24/20 and was found to be back in sinus rhythm after recent recurrence of atrial flutter. Per Adline Peals, PA-C "Patient returns for ECG. He felt he was back in SR on 11/23/20. ECG confirms SR HR 68, PR 180, QRS 120, QTc 446. Will cancel DCCV. Patient should continue anticoagulation until 4 weeks post spontaneous conversion (12/21/20). Then he may hold Eliquis 2-3 days before planned surgery as previously specified."  At this time, the earliest surgery date would be on, or after, 12/25/20.   I will route this recommendation to the requesting party via Epic fax function and remove from pre-op pool.  Please call with questions.  Abigail Butts, PA-C 11/24/2020, 9:28 PM

## 2020-11-24 NOTE — Progress Notes (Signed)
Patient returns for ECG. He felt he was back in SR on 11/23/20. ECG confirms SR HR 68, PR 180, QRS 120, QTc 446. Will cancel DCCV. Patient should continue anticoagulation until 4 weeks post spontaneous conversion (12/21/20). Then he may hold Eliquis 2-3 days before planned surgery as previously specified. Will forward to preop pool.

## 2020-11-25 ENCOUNTER — Encounter (HOSPITAL_COMMUNITY): Admission: RE | Payer: Self-pay | Source: Home / Self Care

## 2020-11-25 ENCOUNTER — Ambulatory Visit (HOSPITAL_COMMUNITY): Admission: RE | Admit: 2020-11-25 | Payer: PPO | Source: Home / Self Care | Admitting: Cardiology

## 2020-11-25 SURGERY — CARDIOVERSION
Anesthesia: Monitor Anesthesia Care

## 2020-11-30 ENCOUNTER — Encounter (HOSPITAL_COMMUNITY): Payer: PPO

## 2020-12-02 ENCOUNTER — Ambulatory Visit (HOSPITAL_COMMUNITY): Payer: PPO | Admitting: Physician Assistant

## 2020-12-06 DIAGNOSIS — D6869 Other thrombophilia: Secondary | ICD-10-CM | POA: Diagnosis not present

## 2020-12-06 DIAGNOSIS — E559 Vitamin D deficiency, unspecified: Secondary | ICD-10-CM | POA: Diagnosis not present

## 2020-12-06 DIAGNOSIS — Z23 Encounter for immunization: Secondary | ICD-10-CM | POA: Diagnosis not present

## 2020-12-06 DIAGNOSIS — Z8673 Personal history of transient ischemic attack (TIA), and cerebral infarction without residual deficits: Secondary | ICD-10-CM | POA: Diagnosis not present

## 2020-12-06 DIAGNOSIS — R413 Other amnesia: Secondary | ICD-10-CM | POA: Diagnosis not present

## 2020-12-06 DIAGNOSIS — R351 Nocturia: Secondary | ICD-10-CM | POA: Diagnosis not present

## 2020-12-06 DIAGNOSIS — I484 Atypical atrial flutter: Secondary | ICD-10-CM | POA: Diagnosis not present

## 2020-12-06 DIAGNOSIS — I480 Paroxysmal atrial fibrillation: Secondary | ICD-10-CM | POA: Diagnosis not present

## 2020-12-06 DIAGNOSIS — E119 Type 2 diabetes mellitus without complications: Secondary | ICD-10-CM | POA: Diagnosis not present

## 2020-12-06 DIAGNOSIS — N400 Enlarged prostate without lower urinary tract symptoms: Secondary | ICD-10-CM | POA: Diagnosis not present

## 2020-12-06 DIAGNOSIS — Z125 Encounter for screening for malignant neoplasm of prostate: Secondary | ICD-10-CM | POA: Diagnosis not present

## 2020-12-09 ENCOUNTER — Ambulatory Visit: Admit: 2020-12-09 | Payer: PPO | Admitting: Orthopedic Surgery

## 2020-12-09 SURGERY — ARTHROPLASTY, SHOULDER, TOTAL, REVERSE
Anesthesia: General | Site: Shoulder | Laterality: Left

## 2020-12-10 ENCOUNTER — Telehealth (HOSPITAL_COMMUNITY): Payer: Self-pay | Admitting: *Deleted

## 2020-12-10 NOTE — Telephone Encounter (Signed)
Patient called in stating his heart rates have been elevated since taking flu/shingles vaccines earlier this week. Pt states HR continues to be around 105 some fatigue. His heart rates in NSR is in the 60s. Will bring in for assessment next week unless rates normalize.

## 2020-12-15 ENCOUNTER — Ambulatory Visit (HOSPITAL_COMMUNITY)
Admission: RE | Admit: 2020-12-15 | Discharge: 2020-12-15 | Disposition: A | Discharge status: Home / Self Care | Payer: PPO | Source: Ambulatory Visit | Attending: Physician Assistant | Admitting: Physician Assistant

## 2020-12-15 ENCOUNTER — Encounter (HOSPITAL_COMMUNITY): Payer: Self-pay | Admitting: Physician Assistant

## 2020-12-15 ENCOUNTER — Other Ambulatory Visit: Payer: Self-pay

## 2020-12-15 VITALS — BP 112/60 | HR 79 | Ht 66.0 in | Wt 172.4 lb

## 2020-12-15 DIAGNOSIS — I10 Essential (primary) hypertension: Secondary | ICD-10-CM | POA: Insufficient documentation

## 2020-12-15 DIAGNOSIS — D6869 Other thrombophilia: Secondary | ICD-10-CM

## 2020-12-15 DIAGNOSIS — Z7901 Long term (current) use of anticoagulants: Secondary | ICD-10-CM | POA: Diagnosis not present

## 2020-12-15 DIAGNOSIS — I4819 Other persistent atrial fibrillation: Secondary | ICD-10-CM

## 2020-12-15 NOTE — Progress Notes (Signed)
Primary Care Physician: Toni Arthurs, NP Referring Physician: Dr. Quentin Ore Cardiologist: Dr. Elayne Guerin Timothy Wells is a 70 y.o. male with a h/o afib, DM, CVA, anemia, memory loss, bradycardia, hyperlipidemia. Use of amiodarone, and prior CV in March 2022. Due to potential side effects from amiodarone and his relative young age for long term use, he had an ablation with Dr. Quentin Ore 07/23/20. Patient reports that he started having an elevated heart rate on 11/15/20. There were no specific triggers identified. He does report symptoms of dizziness in afib.   On follow up today, patient was scheduled for DCCV but he spontaneously converted the procedure was cancelled. He states that he got both his flu and shingles vaccines last week and has noted his heart rate has been elevated ~100 bpm since. He is in SR today. He did not have any dizziness with his elevated rates.   Today, he denies symptoms of palpitations, chest pain, shortness of breath, orthopnea, PND, lower extremity edema, presyncope, syncope, or neurologic sequela. The patient is tolerating medications without difficulties and is otherwise without complaint today.   Past Medical History:  Diagnosis Date   A-fib ([place])    Anemia    Diabetes mellitus without complication (HCC)    type 2   Dysrhythmia    wide complex tachycardia   Edema of both lower legs    Memory loss    Restless leg syndrome    Skin cancer    skin cancer face basal cell   Stroke ([place]) 2019   Past Surgical History:  Procedure Laterality Date   ATRIAL FIBRILLATION ABLATION N/A 07/23/2020   Procedure: ATRIAL FIBRILLATION ABLATION;  Surgeon: Vickie Epley, MD;  Location: [facility];  Service: Cardiovascular;  Laterality: N/A;   CARDIOVERSION N/A 03/25/2019   Procedure: CARDIOVERSION;  Surgeon: Yolonda Kida, MD;  Location: [facility];  Service: Cardiovascular;  Laterality: N/A;   CARDIOVERSION N/A 05/18/2020   Procedure: CARDIOVERSION;   Surgeon: Yolonda Kida, MD;  Location: [facility];  Service: Cardiovascular;  Laterality: N/A;   COLONOSCOPY WITH PROPOFOL N/A 09/10/2019   Procedure: COLONOSCOPY WITH PROPOFOL;  Surgeon: Robert Bellow, MD;  Location: [facility];  Service: Endoscopy;  Laterality: N/A;   SHOULDER ARTHROSCOPY WITH SUBACROMIAL DECOMPRESSION AND OPEN ROTATOR C Left 03/08/2020   Procedure: Left shoulder arthroscopic subscapularis repair and biodegradable balloon spacer placement with partial infraspinatus repair;  Surgeon: Leim Fabry, MD;  Location: [facility];  Service: Orthopedics;  Laterality: Left;   TEE WITHOUT CARDIOVERSION N/A 07/23/2020   Procedure: TRANSESOPHAGEAL ECHOCARDIOGRAM (TEE);  Surgeon: Jerline Pain, MD;  Location: [facility];  Service: Cardiovascular;  Laterality: N/A;    Current Outpatient Medications  Medication Sig Dispense Refill   acetaminophen (TYLENOL) 500 MG tablet Take 1,000 mg by mouth every 6 (six) hours as needed for mild pain or headache.     apixaban (ELIQUIS) 5 MG TABS tablet Take 5 mg by mouth 2 (two) times daily.     atenolol (TENORMIN) 25 MG tablet Take 25 mg by mouth daily.     atorvastatin (LIPITOR) 80 MG tablet Take 80 mg by mouth every evening.      Cholecalciferol (VITAMIN D) 50 MCG (2000 UT) CAPS Take 4,000 Units by mouth daily.     Chromium-Cinnamon (CINNAMON PLUS CHROMIUM PO) Take 1 tablet by mouth 2 (two) times daily. 500 mg / 100 mg     CRANBERRY EXTRACT PO Take 500 mg by mouth daily.  diltiazem (CARDIZEM CD) 360 MG 24 hr capsule Take 1 capsule (360 mg total) by mouth daily. 90 capsule 1   docusate sodium (COLACE) 100 MG capsule Take 100 mg by mouth 2 (two) times daily.     furosemide (LASIX) 20 MG tablet Take 20 mg by mouth daily.     Ginkgo Biloba 60 MG TABS Take 60 mg by mouth daily.     Glucosamine-Chondroitin (COSAMIN DS PO) Take 1 tablet by mouth 2 (two) times daily.     lisinopril (ZESTRIL) 10 MG tablet Take 10 mg by mouth daily.      magnesium oxide (MAG-OX) 400 (241.3 Mg) MG tablet Take 1 tablet (400 mg total) by mouth daily. 30 tablet 0   Multiple Vitamin (MULTIVITAMIN WITH MINERALS) TABS tablet Take 1 tablet by mouth daily.     Omega-3 Fatty Acids (FISH OIL) 1000 MG CAPS Take 1,000 mg by mouth daily. DHA     omeprazole (PRILOSEC) 20 MG capsule Take 20 mg by mouth daily.     OVER THE COUNTER MEDICATION Take 5 mLs by mouth daily. Super Beets power     rOPINIRole (REQUIP) 1 MG tablet Take 1 mg by mouth at bedtime.     tamsulosin (FLOMAX) 0.4 MG CAPS capsule Take 0.4 mg by mouth at bedtime.     Turmeric Curcumin 500 MG CAPS Take 500 mg by mouth 2 (two) times daily.     vitamin B-12 (CYANOCOBALAMIN) 1000 MCG tablet Take 1,000 mcg by mouth daily.     vitamin C (ASCORBIC ACID) 500 MG tablet Take 500 mg by mouth daily.     vitamin E 400 UNIT capsule Take 400 Units by mouth daily.     Zinc 50 MG TABS Take 50 mg by mouth daily.     No current facility-administered medications for this encounter.    No Known Allergies  Social History   Socioeconomic History   Marital status: Single    Spouse name: Not on file   Number of children: Not on file   Years of education: Not on file   Highest education level: Not on file  Occupational History   Not on file  Tobacco Use   Smoking status: Never   Smokeless tobacco: Never  Vaping Use   Vaping Use: Never used  Substance and Sexual Activity   Alcohol use: Yes    Comment: rare   Drug use: Not Currently   Sexual activity: Not on file  Other Topics Concern   Not on file  Social History Narrative   Not on file   Social Determinants of Health   Financial Resource Strain: Not on file  Food Insecurity: Not on file  Transportation Needs: Not on file  Physical Activity: Not on file  Stress: Not on file  Social Connections: Not on file  Intimate Partner Violence: Not on file    No family history on file.  ROS- All systems are reviewed and negative except as per the HPI  above  Physical Exam: Vitals:   12/15/20 1049  BP: 112/60  Pulse: 79  Weight: 78.2 kg  Height: 5\' 6"  (1.676 m)     Wt Readings from Last 3 Encounters:  12/15/20 78.2 kg  11/17/20 77.7 kg  10/27/20 78 kg    Labs: Lab Results  Component Value Date   NA 132 (L) 11/17/2020   K 4.4 11/17/2020   CL 99 11/17/2020   CO2 23 11/17/2020   GLUCOSE 112 (H) 11/17/2020   BUN 20  11/17/2020   CREATININE 0.90 11/17/2020   CALCIUM 8.8 (L) 11/17/2020   MG 2.0 01/21/2019   Lab Results  Component Value Date   INR 1.1 01/21/2019   No results found for: CHOL, HDL, LDLCALC, TRIG  GEN- The patient is a well appearing male, alert and oriented x 3 today.   HEENT-head normocephalic, atraumatic, sclera clear, conjunctiva pink, hearing intact, trachea midline. Lungs- Clear to ausculation bilaterally, normal work of breathing Heart- Regular rate and rhythm, no murmurs, rubs or gallops  GI- soft, NT, ND, + BS Extremities- no clubbing, cyanosis, or edema MS- no significant deformity or atrophy Skin- no rash or lesion Psych- euthymic mood, full affect Neuro- strength and sensation are intact   EKG- SR Vent. rate 79 BPM PR interval 186 ms QRS duration 116 ms QT/QTcB 380/435 ms  CHA2DS2-VASc Score = 5  The patient's score is based upon: CHF History: 0 HTN History: 1 Diabetes History: 1 Stroke History: 2 Vascular Disease History: 0 Age Score: 1 Gender Score: 0       ASSESSMENT AND PLAN: 1. Persistent Atrial Fibrillation/atrial flutter The patient's CHA2DS2-VASc score is 5, indicating a 7.2% annual risk of stroke.   S/p ablation 07/23/20 Patient in [place] today. Suspect elevated rate related to recent vaccinations. He did not have any of the same symptoms he normally has when in afib. No change today.  Continue diltiazem 360 mg daily Continue Eliquis 5 mg BID Continue atenolol 25 mg daily  2. Secondary Hypercoagulable State (ICD10:  D68.69) The patient is at significant risk for  stroke/thromboembolism based upon his CHA2DS2-VASc Score of 5.  Continue Apixaban (Eliquis).   3. HTN Stable, no changes today.   Follow up with Dr Quentin Ore per recall.    [facility] [address] [place], [place] [address] [phone]

## 2020-12-29 ENCOUNTER — Telehealth (HOSPITAL_COMMUNITY): Payer: Self-pay | Admitting: *Deleted

## 2020-12-29 NOTE — Telephone Encounter (Signed)
Patient called in stating he is back out of rhythm after an upsetting confrontation with someone yesterday. His HR currently is 115. Pt is planning a fishing trip to the beach this weekend and has just taken his medications. He will call with update tomorrow - historically he has converted back to normal rhythm on his own. Pt in agreement.

## 2020-12-31 ENCOUNTER — Telehealth: Payer: Self-pay | Admitting: Cardiology

## 2020-12-31 NOTE — Telephone Encounter (Signed)
New message   Pt would like to discuss supplements that he is taking and see if that is why he is having issues.

## 2020-12-31 NOTE — Telephone Encounter (Signed)
Patient returned call. He wanted to know if his Human[employer] supper beets could cause a flutuation in his HR. I reviewed the supplement. Advised I did not think so. He then proceeded to request that I review all of his supplements. I reviewed them all with him.  Ginkgo Biloba and turmeric - advised this can increase risk of bleeding and he should stop Vit E and Omega have no data for heart benefit and he should stop  Reminded patient that supplements are not FDA approved or regulated. They do not have to be what they claim to be or prove to be effective.  49min spent on the phone with patient reviewing.

## 2020-12-31 NOTE — Telephone Encounter (Signed)
Returned pt call. No answer. LVM for pt to call back

## 2020-12-31 NOTE — Telephone Encounter (Signed)
Patient called with update. States he continues out of rhythm. HR around 105-110 feels ok just fatigued. Per Adline Peals PA with 3rd episode within the last 2 months will need to discuss AAD versus repeat ablation. Pt states he would prefer to discuss possible repeat ablation but would like to talk about meds as well so he can get his shoulder fixed. Pt states he would like to discuss with Dr. Quentin Ore - will request appt. Pt to call me back if issues arise prior to seeing Dr. Quentin Ore. Pt usually converts on his own in a week or so he will let us know if that is not the case this time while awaiting appt.

## 2021-01-02 ENCOUNTER — Emergency Department
Admission: EM | Admit: 2021-01-02 | Discharge: 2021-01-02 | Disposition: A | Discharge status: Home / Self Care | Payer: PPO | Attending: Emergency Medicine | Admitting: Emergency Medicine

## 2021-01-02 ENCOUNTER — Other Ambulatory Visit: Payer: Self-pay

## 2021-01-02 ENCOUNTER — Encounter: Payer: Self-pay | Admitting: Emergency Medicine

## 2021-01-02 ENCOUNTER — Emergency Department: Payer: PPO

## 2021-01-02 DIAGNOSIS — R000 Tachycardia, unspecified: Secondary | ICD-10-CM | POA: Insufficient documentation

## 2021-01-02 DIAGNOSIS — Z20822 Contact with and (suspected) exposure to covid-19: Secondary | ICD-10-CM | POA: Diagnosis not present

## 2021-01-02 DIAGNOSIS — Z85828 Personal history of other malignant neoplasm of skin: Secondary | ICD-10-CM | POA: Diagnosis not present

## 2021-01-02 DIAGNOSIS — E119 Type 2 diabetes mellitus without complications: Secondary | ICD-10-CM | POA: Insufficient documentation

## 2021-01-02 DIAGNOSIS — I10 Essential (primary) hypertension: Secondary | ICD-10-CM | POA: Diagnosis not present

## 2021-01-02 DIAGNOSIS — R079 Chest pain, unspecified: Secondary | ICD-10-CM | POA: Diagnosis not present

## 2021-01-02 DIAGNOSIS — Z7901 Long term (current) use of anticoagulants: Secondary | ICD-10-CM | POA: Insufficient documentation

## 2021-01-02 DIAGNOSIS — R002 Palpitations: Secondary | ICD-10-CM | POA: Diagnosis not present

## 2021-01-02 DIAGNOSIS — R0789 Other chest pain: Secondary | ICD-10-CM | POA: Diagnosis not present

## 2021-01-02 LAB — CBC
HCT: 34.2 % — ABNORMAL LOW (ref 39.0–52.0)
Hemoglobin: 11.9 g/dL — ABNORMAL LOW (ref 13.0–17.0)
MCH: 32.2 pg (ref 26.0–34.0)
MCHC: 34.8 g/dL (ref 30.0–36.0)
MCV: 92.4 fL (ref 80.0–100.0)
Platelets: 220 10*3/uL (ref 150–400)
RBC: 3.7 MIL/uL — ABNORMAL LOW (ref 4.22–5.81)
RDW: 13.1 % (ref 11.5–15.5)
WBC: 7.7 10*3/uL (ref 4.0–10.5)
nRBC: 0 % (ref 0.0–0.2)

## 2021-01-02 LAB — BASIC METABOLIC PANEL
Anion gap: 7 (ref 5–15)
BUN: 22 mg/dL (ref 8–23)
CO2: 26 mmol/L (ref 22–32)
Calcium: 8.9 mg/dL (ref 8.9–10.3)
Chloride: 97 mmol/L — ABNORMAL LOW (ref 98–111)
Creatinine, Ser: 0.78 mg/dL (ref 0.61–1.24)
GFR, Estimated: >60 mL/min (ref >60)
Glucose, Bld: 108 mg/dL — ABNORMAL HIGH (ref 70–99)
Potassium: 4 mmol/L (ref 3.5–5.1)
Sodium: 130 mmol/L — ABNORMAL LOW (ref 135–145)

## 2021-01-02 LAB — RESP PANEL BY RT-PCR (FLU A&B, COVID) ARPGX2
Influenza A by PCR: NEGATIVE
Influenza B by PCR: NEGATIVE
SARS Coronavirus 2 by RT PCR: NEGATIVE

## 2021-01-02 LAB — PROTIME-INR
INR: 1.1 (ref 0.8–1.2)
Prothrombin Time: 14.6 seconds (ref 11.4–15.2)

## 2021-01-02 LAB — TROPONIN I (HIGH SENSITIVITY)
Troponin I (High Sensitivity): 21 ng/L — ABNORMAL HIGH (ref <18)
Troponin I (High Sensitivity): 30 ng/L — ABNORMAL HIGH (ref <18)

## 2021-01-02 MED ORDER — METOPROLOL SUCCINATE ER 25 MG PO TB24: 25.0000 mg | ORAL_TABLET | Freq: Every day | ORAL | 1 refills | Status: DC

## 2021-01-02 NOTE — ED Provider Notes (Signed)
Emergency Medicine Provider Triage Evaluation Note  Timothy Wells , a 70 y.o. male  was evaluated in triage.  Pt complains of complaints of chest pain.  Patient had a chest type pressure while at church today.  Arrived via EMS..  Review of Systems  Positive: Chest pain Negative: No shortness of breath, radiation of pain, vomiting  Physical Exam  BP 110/80 (BP Location: Right Arm)   Pulse (!) 110   Temp 98.3 F (36.8 C) (Oral)   Resp 16   SpO2 99%  Gen:   Awake, no distress   Resp:  Normal effort  MSK:   Moves extremities without difficulty  Other:    Medical Decision Making  Medically screening exam initiated at 11:10 AM.  Appropriate orders placed.  Timothy Wells was informed that the remainder of the evaluation will be completed by another provider, this initial triage assessment does not replace that evaluation, and the importance of remaining in the ED until their evaluation is complete.  EKG shows sinus tachycardia, patient appears stable   Timothy Starks, PA-C 01/02/21 1111    Rada Hay, MD 01/02/21 1530

## 2021-01-02 NOTE — ED Triage Notes (Signed)
Pt comes ems from church with chest pain and palpitations. Pt has 18G L AC.

## 2021-01-02 NOTE — ED Notes (Signed)
Patient stable and discharged with all personal belongings and AVS. AVS and discharge instructions reviewed with patient and opportunity for questions provided.   

## 2021-01-02 NOTE — Discharge Instructions (Addendum)
Please seek medical attention for any high fevers, chest pain, shortness of breath, change in behavior, persistent vomiting, bloody stool or any other new or concerning symptoms.  

## 2021-01-02 NOTE — ED Provider Notes (Signed)
[facility] Emergency Department Provider Note   ____________________________________________   I have reviewed the triage vital signs and the nursing notes.   HISTORY  Chief Complaint Chest Pain   History limited by: Not Limited   HPI Timothy Wells is a 70 y.o. male who presents to the emergency department today because of concerns for an episode of chest pain.  Patient states he was at church.  Did feel like his heart was starting to race and then developed some central chest discomfort.  This only lasted for a few minutes before it went away.  Patient states he does have a history of atrial fibrillation and had an ablation performed.  He states he had done fine after that except a few months ago started having episodes of fast heart rate again.  He states however today was the first time he has ever had any chest discomfort.  He denies any associated shortness of breath or diaphoresis.  The time of my exam the patient states he feels back to normal.   Records reviewed. Per medical record review patient has a history of atrial fibrillation, DM.   Past Medical History:  Diagnosis Date   A-fib ([place])    Anemia    Diabetes mellitus without complication (HCC)    type 2   Dysrhythmia    wide complex tachycardia   Edema of both lower legs    Memory loss    Restless leg syndrome    Skin cancer    skin cancer face basal cell   Stroke ([place]) 2019    Patient Active Problem List   Diagnosis Date Noted   Persistent atrial fibrillation ([place]) 12/15/2020   Atypical atrial flutter ([place]) 11/17/2020   Secondary hypercoagulable state ([place]) 11/17/2020   Dizziness    Wide-complex tachycardia 01/21/2019   Hypotension 01/21/2019   History of CVA (cerebrovascular accident) 01/21/2019    Past Surgical History:  Procedure Laterality Date   ATRIAL FIBRILLATION ABLATION N/A 07/23/2020   Procedure: ATRIAL FIBRILLATION ABLATION;  Surgeon: Vickie Epley, MD;   Location: [facility] [place] [facility];  Service: Cardiovascular;  Laterality: N/A;   CARDIOVERSION N/A 03/25/2019   Procedure: CARDIOVERSION;  Surgeon: Yolonda Kida, MD;  Location: [facility];  Service: Cardiovascular;  Laterality: N/A;   CARDIOVERSION N/A 05/18/2020   Procedure: CARDIOVERSION;  Surgeon: Yolonda Kida, MD;  Location: [facility];  Service: Cardiovascular;  Laterality: N/A;   COLONOSCOPY WITH PROPOFOL N/A 09/10/2019   Procedure: COLONOSCOPY WITH PROPOFOL;  Surgeon: Robert Bellow, MD;  Location: [facility] END[facility];  Service: Endoscopy;  Laterality: N/A;   SHOULDER ARTHROSCOPY WITH SUBACROMIAL DECOMPRESSION AND OPEN ROTATOR C Left 03/08/2020   Procedure: Left shoulder arthroscopic subscapularis repair and biodegradable balloon spacer placement with partial infraspinatus repair;  Surgeon: Leim Fabry, MD;  Location: [facility];  Service: Orthopedics;  Laterality: Left;   TEE WITHOUT CARDIOVERSION N/A 07/23/2020   Procedure: TRANSESOPHAGEAL ECHOCARDIOGRAM (TEE);  Surgeon: Jerline Pain, MD;  Location: [facility];  Service: Cardiovascular;  Laterality: N/A;    Prior to Admission medications   Medication Sig Start Date End Date Taking? Authorizing Provider  acetaminophen (TYLENOL) 500 MG tablet Take 1,000 mg by mouth every 6 (six) hours as needed for mild pain or headache.    [provider]  apixaban (ELIQUIS) 5 MG TABS tablet Take 5 mg by mouth 2 (two) times daily.    [provider]  atenolol (TENORMIN) 25 MG tablet Take 25 mg by mouth daily.  [provider]  atorvastatin (LIPITOR) 80 MG tablet Take 80 mg by mouth every evening.  07/02/17   [provider]  Cholecalciferol (VITAMIN D) 50 MCG (2000 UT) CAPS Take 4,000 Units by mouth daily.    [provider]  Chromium-Cinnamon (CINNAMON PLUS CHROMIUM PO) Take 1 tablet by mouth 2 (two) times daily. 500 mg / 100 mg    [provider]  CRANBERRY EXTRACT PO Take 500 mg by mouth  daily.    [provider]  diltiazem (CARDIZEM CD) 360 MG 24 hr capsule Take 1 capsule (360 mg total) by mouth daily. 11/17/20 02/15/21  Fenton, Clint R, PA  docusate sodium (COLACE) 100 MG capsule Take 100 mg by mouth 2 (two) times daily.    [provider]  furosemide (LASIX) 20 MG tablet Take 20 mg by mouth daily.    [provider]  Ginkgo Biloba 60 MG TABS Take 60 mg by mouth daily.    [provider]  Glucosamine-Chondroitin (COSAMIN DS PO) Take 1 tablet by mouth 2 (two) times daily.    [provider]  lisinopril (ZESTRIL) 10 MG tablet Take 10 mg by mouth daily. 02/06/20   [provider]  magnesium oxide (MAG-OX) 400 (241.3 Mg) MG tablet Take 1 tablet (400 mg total) by mouth daily. 01/23/19   Fritzi Mandes, MD  Multiple Vitamin (MULTIVITAMIN WITH MINERALS) TABS tablet Take 1 tablet by mouth daily.    [provider]  Omega-3 Fatty Acids (FISH OIL) 1000 MG CAPS Take 1,000 mg by mouth daily. [place]    [provider]  omeprazole (PRILOSEC) 20 MG capsule Take 20 mg by mouth daily.    [provider]  OVER THE COUNTER MEDICATION Take 5 mLs by mouth daily. Super Beets power    [provider]  rOPINIRole (REQUIP) 1 MG tablet Take 1 mg by mouth at bedtime.    [provider]  tamsulosin (FLOMAX) 0.4 MG CAPS capsule Take 0.4 mg by mouth at bedtime. 10/15/19   [provider]  Turmeric Curcumin 500 MG CAPS Take 500 mg by mouth 2 (two) times daily.    [provider]  vitamin B-12 (CYANOCOBALAMIN) 1000 MCG tablet Take 1,000 mcg by mouth daily.    [provider]  vitamin C (ASCORBIC ACID) 500 MG tablet Take 500 mg by mouth daily.    [provider]  vitamin E 400 UNIT capsule Take 400 Units by mouth daily.    [provider]  Zinc 50 MG TABS Take 50 mg by mouth daily.    [provider]    Allergies Patient has no known allergies.  No family history  on file.  Social History Social History   Tobacco Use   Smoking status: Never   Smokeless tobacco: Never  Vaping Use   Vaping Use: Never used  Substance Use Topics   Alcohol use: Yes    Comment: rare   Drug use: Not Currently    Review of Systems Constitutional: No fever/chills Eyes: No visual changes. ENT: No sore throat. Cardiovascular: Positive for palpitations and chest pain. Respiratory: Denies shortness of breath. Gastrointestinal: No abdominal pain.  No nausea, no vomiting.  No diarrhea.   Genitourinary: Negative for dysuria. Musculoskeletal: Negative for back pain. Skin: Negative for rash. Neurological: Negative for headaches, focal weakness or numbness.  ____________________________________________   PHYSICAL EXAM:  VITAL SIGNS: ED Triage Vitals [01/02/21 1030]  Enc Vitals Group     BP 110/80  Pulse Rate (!) 110     Resp 16     Temp 98.3 F (36.8 C)     Temp Source Oral     SpO2 99 %     Weight      Height      Head Circumference      Peak Flow      Pain Score 0   Constitutional: Alert and oriented.  Eyes: Conjunctivae are normal.  ENT      Head: Normocephalic and atraumatic.      Nose: No congestion/rhinnorhea.      Mouth/Throat: Mucous membranes are moist.      Neck: No stridor. Hematological/Lymphatic/Immunilogical: No cervical lymphadenopathy. Cardiovascular: Tachycardic, irregular rhythm.  No murmurs, rubs, or gallops.  Respiratory: Normal respiratory effort without tachypnea nor retractions. Breath sounds are clear and equal bilaterally. No wheezes/rales/rhonchi. Gastrointestinal: Soft and non tender. No rebound. No guarding.  Genitourinary: Deferred Musculoskeletal: Normal range of motion in all extremities. No lower extremity edema. Neurologic:  Normal speech and language. No gross focal neurologic deficits are appreciated.  Skin:  Skin is warm, dry and intact. No rash noted. Psychiatric: Mood and affect are normal. Speech and  behavior are normal. Patient exhibits appropriate insight and judgment.  ____________________________________________    LABS (pertinent positives/negatives)  COVID, influenza negative CBC wbc 7.7, hgb 11.9, plt 220 Trop hs 21 to 30 BMP na 130, k 4.0, glu 108, cr 0.78  ____________________________________________   EKG  I, Nance Pear, attending physician, personally viewed and interpreted this EKG  EKG Time: 0951 Rate: 111 Rhythm: sinus tachycardia Axis: normal Intervals: qtc 473 QRS: narrow ST changes: no st elevation Impression: abnormal ekg   ____________________________________________    RADIOLOGY  CXR No cardiopulmonary disease  ____________________________________________   PROCEDURES  Procedures  ____________________________________________   INITIAL IMPRESSION / ASSESSMENT AND PLAN / ED COURSE  Pertinent labs & imaging results that were available during my care of the patient were reviewed by me and considered in my medical decision making (see chart for details).   Patient presented to the emergency department today after an episode of chest pain and fast heart rate.  Patient with a history of atrial fibrillation.  By the time my exam his symptoms had improved.  Patient troponin was mildly elevated at 21 and repeat is again mildly elevated at 30.  Did discuss with Dr. Chancy Milroy over the telephone with cardiology. Will plan on discharging patient with close follow up in clinic. Additionally he recommended started low dose metoprolol. Discussed this with the patient.   ____________________________________________   FINAL CLINICAL IMPRESSION(S) / ED DIAGNOSES  Final diagnoses:  Nonspecific chest pain     Note: This dictation was prepared with Dragon dictation. Any transcriptional errors that result from this process are unintentional     Nance Pear, MD 01/02/21 2007

## 2021-01-02 NOTE — ED Notes (Signed)
Pt to ED via [facility] from church for chest pain, pt states that he was at church greeting people and got excited. Pt states that he went and sat down and the pain got better. Pt denies cardiac hx. Pt is not having pain at this time.

## 2021-01-05 DIAGNOSIS — Z8679 Personal history of other diseases of the circulatory system: Secondary | ICD-10-CM | POA: Diagnosis not present

## 2021-01-05 DIAGNOSIS — E119 Type 2 diabetes mellitus without complications: Secondary | ICD-10-CM | POA: Diagnosis not present

## 2021-01-05 DIAGNOSIS — I10 Essential (primary) hypertension: Secondary | ICD-10-CM | POA: Diagnosis not present

## 2021-01-05 DIAGNOSIS — R0602 Shortness of breath: Secondary | ICD-10-CM | POA: Diagnosis not present

## 2021-01-05 DIAGNOSIS — R600 Localized edema: Secondary | ICD-10-CM | POA: Diagnosis not present

## 2021-01-05 DIAGNOSIS — Z8673 Personal history of transient ischemic attack (TIA), and cerebral infarction without residual deficits: Secondary | ICD-10-CM | POA: Diagnosis not present

## 2021-01-05 DIAGNOSIS — I480 Paroxysmal atrial fibrillation: Secondary | ICD-10-CM | POA: Diagnosis not present

## 2021-01-05 DIAGNOSIS — R000 Tachycardia, unspecified: Secondary | ICD-10-CM | POA: Diagnosis not present

## 2021-01-05 DIAGNOSIS — R002 Palpitations: Secondary | ICD-10-CM | POA: Diagnosis not present

## 2021-01-05 DIAGNOSIS — Z9889 Other specified postprocedural states: Secondary | ICD-10-CM | POA: Diagnosis not present

## 2021-01-05 DIAGNOSIS — E782 Mixed hyperlipidemia: Secondary | ICD-10-CM | POA: Diagnosis not present

## 2021-01-18 NOTE — H&P (View-Only) (Signed)
Electrophysiology Office Follow up Visit Note:    Date:  01/19/2021   ID:  Timothy Wells, DOB 03/26/1950, MRN [id number]  PCP:  Toni Arthurs, NP  [facility] Cardiologist:  None  [facility] Electrophysiologist:  Vickie Epley, MD    Interval History:    Timothy Wells is a 70 y.o. male who presents for a follow up visit.  He underwent an ablation for symptomatic persistent atrial fibrillation on Jul 23, 2020.  During that procedure the pulmonary veins and posterior wall were isolated.  The initial electroanatomic voltage map demonstrated extensive scarring within the left atrium extending from the pulmonary veins onto the posterior wall.  There is also scarring noted on the posterior wall.  He did well initially after the ablation but on September 19 he developed elevated heart rates and was scheduled for cardioversion.  He converted back to sinus rhythm prior to the cardioversion so the cardioversion was canceled. The patient saw Dr. Clayborn Bigness on November 9 in follow-up.  Today he tells me he is doing okay.  He is back out of rhythm and an atypical atrial flutter.  He is taking his Eliquis without any interruptions.  He tells me he was doing well for several months after his ablation but then after shingles vaccine went back out of rhythm.      Past Medical History:  Diagnosis Date   A-fib ([place])    Anemia    Diabetes mellitus without complication (HCC)    type 2   Dysrhythmia    wide complex tachycardia   Edema of both lower legs    Memory loss    Restless leg syndrome    Skin cancer    skin cancer face basal cell   Stroke ([place]) 2019    Past Surgical History:  Procedure Laterality Date   ATRIAL FIBRILLATION ABLATION N/A 07/23/2020   Procedure: ATRIAL FIBRILLATION ABLATION;  Surgeon: Vickie Epley, MD;  Location: [place] [facility];  Service: Cardiovascular;  Laterality: N/A;   CARDIOVERSION N/A 03/25/2019   Procedure: CARDIOVERSION;  Surgeon: Yolonda Kida, MD;  Location: [facility];  Service: Cardiovascular;  Laterality: N/A;   CARDIOVERSION N/A 05/18/2020   Procedure: CARDIOVERSION;  Surgeon: Yolonda Kida, MD;  Location: [facility] ORS;  Service: Cardiovascular;  Laterality: N/A;   COLONOSCOPY WITH PROPOFOL N/A 09/10/2019   Procedure: COLONOSCOPY WITH PROPOFOL;  Surgeon: Robert Bellow, MD;  Location: [facility] ENDOSCOPY;  Service: Endoscopy;  Laterality: N/A;   SHOULDER ARTHROSCOPY WITH SUBACROMIAL DECOMPRESSION AND OPEN ROTATOR C Left 03/08/2020   Procedure: Left shoulder arthroscopic subscapularis repair and biodegradable balloon spacer placement with partial infraspinatus repair;  Surgeon: Leim Fabry, MD;  Location: [place];  Service: Orthopedics;  Laterality: Left;   TEE WITHOUT CARDIOVERSION N/A 07/23/2020   Procedure: TRANSESOPHAGEAL ECHOCARDIOGRAM (TEE);  Surgeon: Jerline Pain, MD;  [facility] END[facility];  Service: Cardiovascular;  Laterality: N/A;    Current Medications: Current Meds  Medication Sig   acetaminophen (TYLENOL) 500 MG tablet Take 1,000 mg by mouth every 6 (six) hours as needed for mild pain or headache.   amiodarone (PACERONE) 200 MG tablet Take 200 mg by mouth daily.   apixaban (ELIQUIS) 5 MG TABS tablet Take 5 mg by mouth 2 (two) times daily.   atenolol (TENORMIN) 25 MG tablet Take 50 mg by mouth daily.   atorvastatin (LIPITOR) 80 MG tablet Take 80 mg by mouth every evening.    Cholecalciferol (VITAMIN D) 50 MCG (2000 UT)  CAPS Take 4,000 Units by mouth daily.   Chromium-Cinnamon (CINNAMON PLUS CHROMIUM PO) Take 1 tablet by mouth 2 (two) times daily. 500 mg / 100 mg   CRANBERRY EXTRACT PO Take 500 mg by mouth daily.   diltiazem (CARDIZEM CD) 360 MG 24 hr capsule Take 1 capsule (360 mg total) by mouth daily.   docusate sodium (COLACE) 100 MG capsule Take 100 mg by mouth 2 (two) times daily.   furosemide (LASIX) 20 MG tablet Take 20 mg by mouth daily.   Glucosamine-Chondroitin (COSAMIN DS PO) Take 1  tablet by mouth 2 (two) times daily.   lisinopril (ZESTRIL) 10 MG tablet Take 10 mg by mouth daily.   magnesium oxide (MAG-OX) 400 (241.3 Mg) MG tablet Take 1 tablet (400 mg total) by mouth daily.   metoprolol succinate (TOPROL XL) 25 MG 24 hr tablet Take 1 tablet (25 mg total) by mouth daily.   Multiple Vitamin (MULTIVITAMIN WITH MINERALS) TABS tablet Take 1 tablet by mouth daily.   Omega-3 Fatty Acids (FISH OIL) 1000 MG CAPS Take 1,000 mg by mouth daily. DHA   omeprazole (PRILOSEC) 20 MG capsule Take 20 mg by mouth daily.   OVER THE COUNTER MEDICATION Take 5 mLs by mouth daily. Super Beets power   rOPINIRole (REQUIP) 1 MG tablet Take 1 mg by mouth at bedtime.   tamsulosin (FLOMAX) 0.4 MG CAPS capsule Take 0.4 mg by mouth at bedtime.   Turmeric Curcumin 500 MG CAPS Take 500 mg by mouth 2 (two) times daily.   vitamin B-12 (CYANOCOBALAMIN) 1000 MCG tablet Take 1,000 mcg by mouth daily.   vitamin C (ASCORBIC ACID) 500 MG tablet Take 500 mg by mouth daily.   vitamin E 400 UNIT capsule Take 400 Units by mouth daily.   Zinc 50 MG TABS Take 50 mg by mouth daily.     Allergies:   Patient has no known allergies.   Social History   Socioeconomic History   Marital status: Single    Spouse name: Not on file   Number of children: Not on file   Years of education: Not on file   Highest education level: Not on file  Occupational History   Not on file  Tobacco Use   Smoking status: Never   Smokeless tobacco: Never  Vaping Use   Vaping Use: Never used  Substance and Sexual Activity   Alcohol use: Yes    Comment: rare   Drug use: Not Currently   Sexual activity: Not on file  Other Topics Concern   Not on file  Social History Narrative   Not on file   Social Determinants of Health   Financial Resource Strain: Not on file  Food Insecurity: Not on file  Transportation Needs: Not on file  Physical Activity: Not on file  Stress: Not on file  Social Connections: Not on file     Family  History: The patient's family history is not on file.  ROS:   Please see the history of present illness.    All other systems reviewed and are negative.  EKGs/Labs/Other Studies Reviewed:    The following studies were reviewed today:  January 02, 2021 EKG shows atypical atrial flutter with 2 1 AV conduction  EKG:  The ekg ordered today demonstrates atypical atrial flutter with a ventricular rate of 105.  Recent Labs: 01/02/2021: BUN 22; Creatinine, Ser 0.78; Hemoglobin 11.9; Platelets 220; Potassium 4.0; Sodium 130  Recent Lipid Panel No results found for: CHOL, TRIG, HDL, CHOLHDL,  VLDL, LDLCALC, LDLDIRECT  Physical Exam:    VS:  BP 118/62   Pulse (!) 111   Ht 5\' 6"  (1.676 m)   Wt 173 lb 12.8 oz (78.8 kg)   SpO2 98%   BMI 28.05 kg/m     Wt Readings from Last 3 Encounters:  01/19/21 173 lb 12.8 oz (78.8 kg)  12/15/20 172 lb 6.4 oz (78.2 kg)  11/17/20 171 lb 6.4 oz (77.7 kg)     GEN:  Well nourished, well developed in no acute distress HEENT: Normal NECK: No JVD; No carotid bruits LYMPHATICS: No lymphadenopathy CARDIAC: Regular rhythm, tachycardic, no murmurs, rubs, gallops RESPIRATORY:  Clear to auscultation without rales, wheezing or rhonchi  ABDOMEN: Soft, non-tender, non-distended MUSCULOSKELETAL:  No edema; No deformity  SKIN: Warm and dry NEUROLOGIC:  Alert and oriented x 3 PSYCHIATRIC:  Normal affect        ASSESSMENT:    1. Persistent atrial fibrillation ([place])   2. Atypical atrial flutter (HCC)   3. Primary hypertension    PLAN:    In order of problems listed above:  #Persistent atrial fibrillation and flutter Initially did well after ablation but had a recurrence after his shingles vaccine.  Is possible that the inflammatory response after his vaccine prompted the arrhythmia to return.  I like to cardiovert him.  We will get this scheduled.  If he has recurrence after cardioversion I would plan to initiate Tikosyn.  This was discussed with the  patient.  I will see him back in 3 months.  If he maintains normal rhythm then we can space out our follow-up.  If he is back out of rhythm at my follow-up with him, would plan to get the [place] admission scheduled.  #Hypertension Controlled.  Continue current regimen.  Follow-up 3 months with me.    Medication Adjustments/Labs and Tests Ordered: Current medicines are reviewed at length with the patient today.  Concerns regarding medicines are outlined above.  No orders of the defined types were placed in this encounter.  No orders of the defined types were placed in this encounter.    Signed, Lars Mage, MD, [facility], [facility] 01/19/2021 9:11 AM    Electrophysiology [facility]

## 2021-01-18 NOTE — Progress Notes (Signed)
Electrophysiology Office Follow up Visit Note:    Date:  01/19/2021   ID:  Timothy Wells, DOB December 16, 1950, MRN [id number]  PCP:  Toni Arthurs, NP  [facility] Cardiologist:  None  [facility] Electrophysiologist:  Vickie Epley, MD    Interval History:    Timothy Wells is a 70 y.o. male who presents for a follow up visit.  He underwent an ablation for symptomatic persistent atrial fibrillation on Jul 23, 2020.  During that procedure the pulmonary veins and posterior wall were isolated.  The initial electroanatomic voltage map demonstrated extensive scarring within the left atrium extending from the pulmonary veins onto the posterior wall.  There is also scarring noted on the posterior wall.  He did well initially after the ablation but on September 19 he developed elevated heart rates and was scheduled for cardioversion.  He converted back to sinus rhythm prior to the cardioversion so the cardioversion was canceled. The patient saw Dr. Clayborn Bigness on November 9 in follow-up.  Today he tells me he is doing okay.  He is back out of rhythm and an atypical atrial flutter.  He is taking his Eliquis without any interruptions.  He tells me he was doing well for several months after his ablation but then after shingles vaccine went back out of rhythm.      Past Medical History:  Diagnosis Date   A-fib ([place])    Anemia    Diabetes mellitus without complication (HCC)    type 2   Dysrhythmia    wide complex tachycardia   Edema of both lower legs    Memory loss    Restless leg syndrome    Skin cancer    skin cancer face basal cell   Stroke ([place]) 2019    Past Surgical History:  Procedure Laterality Date   ATRIAL FIBRILLATION ABLATION N/A 07/23/2020   Procedure: ATRIAL FIBRILLATION ABLATION;  Surgeon: Vickie Epley, MD;  Location: [place] [facility];  Service: Cardiovascular;  Laterality: N/A;   CARDIOVERSION N/A 03/25/2019   Procedure: CARDIOVERSION;  Surgeon: Yolonda Kida, MD;  Location: [facility] ORS;  Service: Cardiovascular;  Laterality: N/A;   CARDIOVERSION N/A 05/18/2020   Procedure: CARDIOVERSION;  Surgeon: Yolonda Kida, MD;  Location: [facility] OR[facility];  Service: Cardiovascular;  Laterality: N/A;   COLONOSCOPY WITH PROPOFOL N/A 09/10/2019   Procedure: COLONOSCOPY WITH PROPOFOL;  Surgeon: Robert Bellow, MD;  Location: [facility] ENDOSCOPY;  Service: Endoscopy;  Laterality: N/A;   SHOULDER ARTHROSCOPY WITH SUBACROMIAL DECOMPRESSION AND OPEN ROTATOR C Left 03/08/2020   Procedure: Left shoulder arthroscopic subscapularis repair and biodegradable balloon spacer placement with partial infraspinatus repair;  Surgeon: Leim Fabry, MD;  Location: [place];  Service: Orthopedics;  Laterality: Left;   TEE WITHOUT CARDIOVERSION N/A 07/23/2020   Procedure: TRANSESOPHAGEAL ECHOCARDIOGRAM (TEE);  Surgeon: Jerline Pain, MD;  Location: [facility] ENDOSCOPY;  Service: Cardiovascular;  Laterality: N/A;    Current Medications: Current Meds  Medication Sig   acetaminophen (TYLENOL) 500 MG tablet Take 1,000 mg by mouth every 6 (six) hours as needed for mild pain or headache.   amiodarone (PACERONE) 200 MG tablet Take 200 mg by mouth daily.   apixaban (ELIQUIS) 5 MG TABS tablet Take 5 mg by mouth 2 (two) times daily.   atenolol (TENORMIN) 25 MG tablet Take 50 mg by mouth daily.   atorvastatin (LIPITOR) 80 MG tablet Take 80 mg by mouth every evening.    Cholecalciferol (VITAMIN D) 50 MCG (2000 UT)  CAPS Take 4,000 Units by mouth daily.   Chromium-Cinnamon (CINNAMON PLUS CHROMIUM PO) Take 1 tablet by mouth 2 (two) times daily. 500 mg / 100 mg   CRANBERRY EXTRACT PO Take 500 mg by mouth daily.   diltiazem (CARDIZEM CD) 360 MG 24 hr capsule Take 1 capsule (360 mg total) by mouth daily.   docusate sodium (COLACE) 100 MG capsule Take 100 mg by mouth 2 (two) times daily.   furosemide (LASIX) 20 MG tablet Take 20 mg by mouth daily.   Glucosamine-Chondroitin (COSAMIN DS PO) Take 1  tablet by mouth 2 (two) times daily.   lisinopril (ZESTRIL) 10 MG tablet Take 10 mg by mouth daily.   magnesium oxide (MAG-OX) 400 (241.3 Mg) MG tablet Take 1 tablet (400 mg total) by mouth daily.   metoprolol succinate (TOPROL XL) 25 MG 24 hr tablet Take 1 tablet (25 mg total) by mouth daily.   Multiple Vitamin (MULTIVITAMIN WITH MINERALS) TABS tablet Take 1 tablet by mouth daily.   Omega-3 Fatty Acids (FISH OIL) 1000 MG CAPS Take 1,000 mg by mouth daily. DHA   omeprazole (PRILOSEC) 20 MG capsule Take 20 mg by mouth daily.   OVER THE COUNTER MEDICATION Take 5 mLs by mouth daily. Super Beets power   rOPINIRole (REQUIP) 1 MG tablet Take 1 mg by mouth at bedtime.   tamsulosin (FLOMAX) 0.4 MG CAPS capsule Take 0.4 mg by mouth at bedtime.   Turmeric Curcumin 500 MG CAPS Take 500 mg by mouth 2 (two) times daily.   vitamin B-12 (CYANOCOBALAMIN) 1000 MCG tablet Take 1,000 mcg by mouth daily.   vitamin C (ASCORBIC ACID) 500 MG tablet Take 500 mg by mouth daily.   vitamin E 400 UNIT capsule Take 400 Units by mouth daily.   Zinc 50 MG TABS Take 50 mg by mouth daily.     Allergies:   Patient has no known allergies.   Social History   Socioeconomic History   Marital status: Single    Spouse name: Not on file   Number of children: Not on file   Years of education: Not on file   Highest education level: Not on file  Occupational History   Not on file  Tobacco Use   Smoking status: Never   Smokeless tobacco: Never  Vaping Use   Vaping Use: Never used  Substance and Sexual Activity   Alcohol use: Yes    Comment: rare   Drug use: Not Currently   Sexual activity: Not on file  Other Topics Concern   Not on file  Social History Narrative   Not on file   Social Determinants of Health   Financial Resource Strain: Not on file  Food Insecurity: Not on file  Transportation Needs: Not on file  Physical Activity: Not on file  Stress: Not on file  Social Connections: Not on file     Family  History: The patient's family history is not on file.  ROS:   Please see the history of present illness.    All other systems reviewed and are negative.  EKGs/Labs/Other Studies Reviewed:    The following studies were reviewed today:  January 02, 2021 EKG shows atypical atrial flutter with 2 1 AV conduction  EKG:  The ekg ordered today demonstrates atypical atrial flutter with a ventricular rate of 105.  Recent Labs: 01/02/2021: BUN 22; Creatinine, Ser 0.78; Hemoglobin 11.9; Platelets 220; Potassium 4.0; Sodium 130  Recent Lipid Panel No results found for: CHOL, TRIG, HDL, CHOLHDL,  VLDL, LDLCALC, LDLDIRECT  Physical Exam:    VS:  BP 118/62   Pulse (!) 111   Ht 5\' 6"  (1.676 m)   Wt 173 lb 12.8 oz (78.8 kg)   SpO2 98%   BMI 28.05 kg/m     Wt Readings from Last 3 Encounters:  01/19/21 173 lb 12.8 oz (78.8 kg)  12/15/20 172 lb 6.4 oz (78.2 kg)  11/17/20 171 lb 6.4 oz (77.7 kg)     GEN:  Well nourished, well developed in no acute distress HEENT: Normal NECK: No JVD; No carotid bruits LYMPHATICS: No lymphadenopathy CARDIAC: Regular rhythm, tachycardic, no murmurs, rubs, gallops RESPIRATORY:  Clear to auscultation without rales, wheezing or rhonchi  ABDOMEN: Soft, non-tender, non-distended MUSCULOSKELETAL:  No edema; No deformity  SKIN: Warm and dry NEUROLOGIC:  Alert and oriented x 3 PSYCHIATRIC:  Normal affect        ASSESSMENT:    1. Persistent atrial fibrillation ([place])   2. Atypical atrial flutter (HCC)   3. Primary hypertension    PLAN:    In order of problems listed above:  #Persistent atrial fibrillation and flutter Initially did well after ablation but had a recurrence after his shingles vaccine.  Is possible that the inflammatory response after his vaccine prompted the arrhythmia to return.  I like to cardiovert him.  We will get this scheduled.  If he has recurrence after cardioversion I would plan to initiate Tikosyn.  This was discussed with the  patient.  I will see him back in 3 months.  If he maintains normal rhythm then we can space out our follow-up.  If he is back out of rhythm at my follow-up with him, would plan to get the [place] admission scheduled.  #Hypertension Controlled.  Continue current regimen.  Follow-up 3 months with me.    Medication Adjustments/Labs and Tests Ordered: Current medicines are reviewed at length with the patient today.  Concerns regarding medicines are outlined above.  No orders of the defined types were placed in this encounter.  No orders of the defined types were placed in this encounter.    Signed, Lars Mage, MD, [facility], [facility] 01/19/2021 9:11 AM    Electrophysiology [facility]

## 2021-01-19 ENCOUNTER — Telehealth: Payer: Self-pay | Admitting: Cardiology

## 2021-01-19 ENCOUNTER — Encounter: Payer: Self-pay | Admitting: Cardiology

## 2021-01-19 ENCOUNTER — Ambulatory Visit: Payer: PPO | Admitting: Cardiology

## 2021-01-19 ENCOUNTER — Other Ambulatory Visit: Payer: Self-pay

## 2021-01-19 VITALS — BP 118/62 | HR 111 | Ht 66.0 in | Wt 173.8 lb

## 2021-01-19 DIAGNOSIS — I4819 Other persistent atrial fibrillation: Secondary | ICD-10-CM

## 2021-01-19 DIAGNOSIS — I10 Essential (primary) hypertension: Secondary | ICD-10-CM | POA: Diagnosis not present

## 2021-01-19 DIAGNOSIS — I484 Atypical atrial flutter: Secondary | ICD-10-CM | POA: Diagnosis not present

## 2021-01-19 NOTE — Telephone Encounter (Signed)
Sent mychart message

## 2021-01-19 NOTE — Telephone Encounter (Signed)
I called and spoke with the patient. I have advised him that we needed to change his arrival time/ procedure time for his DCCV on Monday 01/24/21 due to a cancellation.  I have asked him to please arrive at 6:30 am for a 7:30 am DCCV on 01/24/21 at the [facility].  The patient voices understanding and is agreeable.

## 2021-01-19 NOTE — Telephone Encounter (Signed)
The patient was seen in office today by Dr. Quentin Ore and scheduled for a DCCV on 01/24/21 at 8:00 am at [facility].  I had to cancel the patient who was originally scheduled at 7:30 am on 11/28 and per scheduling we will need to contact Timothy Wells and advise him he will need to now come at 6:30 am for a 7:30 am DCCV (due to anesthesia).   We will need to call [facility] scheduling back once we confirm this with the patient.  I have attempted to call the patient on his cell #: No answer- I left a message to please call back. No DPR on file to leave a detailed message on his cell voice mail.   Attempted to call his home #. No answer & no voice mail.

## 2021-01-19 NOTE — Patient Instructions (Addendum)
Medication Instructions:  Your physician recommends that you continue on your current medications as directed. Please refer to the Current Medication list given to you today. *If you need a refill on your cardiac medications before your next appointment, please call your pharmacy*  Lab Work: None ordered. If you have labs (blood work) drawn today and your tests are completely normal, you will receive your results only by: [place] (if you have MyChart) OR A paper copy in the mail If you have any lab test that is abnormal or we need to change your treatment, we will call you to review the results.  Testing/Procedures: Your physician has recommended that you have a Cardioversion (DCCV). Electrical Cardioversion uses a jolt of electricity to your heart either through paddles or wired patches attached to your chest. This is a controlled, usually prescheduled, procedure. Defibrillation is done under light anesthesia in the hospital, and you usually go home the day of the procedure. This is done to get your heart back into a normal rhythm. You are not awake for the procedure. Please see the instruction sheet given to you today.  Follow-Up: At [facility], you and your health needs are our priority.  As part of our continuing mission to provide you with exceptional heart care, we have created designated Provider Care Teams.  These Care Teams include your primary Cardiologist (physician) and Advanced Practice Providers (APPs -  Physician Assistants and Nurse Practitioners) who all work together to provide you with the care you need, when you need it.  Your next appointment:   Your physician wants you to follow-up in: 3 months with Dr. Hunt Oris are scheduled for a Cardioversion on January 24, 2021 with Dr. Fletcher Anon. Please arrive at the [place] [facility] at 7:00 a.m. on the day of your procedure.  DIET INSTRUCTIONS:  Nothing to eat or drink after midnight except your medications with a               sip of water.       Medications:  Take your Eliquis with a sip of water.  Take the remainder of your morning medications when you get home.  Must have a responsible person to drive you home.  Bring a current list of your medications and current insurance cards.   If you have any questions after you get home, please call the office at [phone]

## 2021-01-23 NOTE — Anesthesia Preprocedure Evaluation (Addendum)
Anesthesia Evaluation  Patient identified by MRN, date of birth, ID band Patient awake    Reviewed: Allergy & Precautions, NPO status , Patient's Chart, lab work & pertinent test results, reviewed documented beta blocker date and time   History of Anesthesia Complications Negative for: history of anesthetic complications  Airway Mallampati: I  TM Distance: >3 FB Neck ROM: Full    Dental  (+) Partial Upper, Dental Advisory Given   Pulmonary neg pulmonary ROS,    Pulmonary exam normal        Cardiovascular Exercise Tolerance: Good hypertension, Pt. on home beta blockers and Pt. on medications + dysrhythmias (s/p afib ablation 06/2020. Now with recurrent Afib) Atrial Fibrillation  Rhythm:Irregular Rate:Bradycardia     Neuro/Psych CVA (2019), No Residual Symptoms    GI/Hepatic negative GI ROS, Neg liver ROS,   Endo/Other  negative endocrine ROS  Renal/GU negative Renal ROS  negative genitourinary   Musculoskeletal negative musculoskeletal ROS (+)   Abdominal Normal abdominal exam  (+)   Peds  Hematology  (+) anemia ,   Anesthesia Other Findings  Echo 07/14/20: 1. Left ventricular ejection fraction, by estimation, is 60 to 65%. The left ventricle has normal function. The left ventricle has no regional wall motion abnormalities. There is mild left ventricular hypertrophy. Left ventricular diastolic parameters  are consistent with Grade II diastolic dysfunction (pseudonormalization). 2. Right ventricular systolic function is normal. The right ventricular size is normal. 3. Left atrial size was moderately dilated. 4. Right atrial size was mildly dilated. 5. The mitral valve is normal in structure. Trivial mitral valve regurgitation. 6. The aortic valve is tricuspid. Aortic valve regurgitation is not visualized. 7. The inferior vena cava is normal in size with <50% respiratory variability, suggesting right atrial  pressure of 8 mmHg.  Reproductive/Obstetrics                            Anesthesia Physical  Anesthesia Plan  ASA: III  Anesthesia Plan: General   Post-op Pain Management:    Induction: Intravenous  PONV Risk Score and Plan: 2 and Propofol infusion  Airway Management Planned: Nasal Cannula and Natural Airway  Additional Equipment:   Intra-op Plan:   Post-operative Plan:   Informed Consent: I have reviewed the patients History and Physical, chart, labs and discussed the procedure including the risks, benefits and alternatives for the proposed anesthesia with the patient or authorized representative who has indicated his/her understanding and acceptance.     Dental advisory given  Plan Discussed with: CRNA and Anesthesiologist  Anesthesia Plan Comments:        Anesthesia Quick Evaluation

## 2021-01-24 ENCOUNTER — Other Ambulatory Visit: Payer: Self-pay

## 2021-01-24 ENCOUNTER — Ambulatory Visit: Payer: PPO | Admitting: Anesthesiology

## 2021-01-24 ENCOUNTER — Encounter
Admission: RE | Disposition: A | Discharge status: Home / Self Care | Payer: Self-pay | Source: Ambulatory Visit | Attending: Cardiovascular Disease

## 2021-01-24 ENCOUNTER — Ambulatory Visit
Admission: RE | Admit: 2021-01-24 | Discharge: 2021-01-24 | Disposition: A | Discharge status: Home / Self Care | Payer: PPO | Source: Ambulatory Visit | Attending: Cardiovascular Disease | Admitting: Cardiovascular Disease

## 2021-01-24 ENCOUNTER — Encounter: Payer: Self-pay | Admitting: Cardiovascular Disease

## 2021-01-24 DIAGNOSIS — I484 Atypical atrial flutter: Secondary | ICD-10-CM | POA: Diagnosis not present

## 2021-01-24 DIAGNOSIS — Z7901 Long term (current) use of anticoagulants: Secondary | ICD-10-CM | POA: Diagnosis not present

## 2021-01-24 DIAGNOSIS — Z8673 Personal history of transient ischemic attack (TIA), and cerebral infarction without residual deficits: Secondary | ICD-10-CM

## 2021-01-24 DIAGNOSIS — I10 Essential (primary) hypertension: Secondary | ICD-10-CM | POA: Insufficient documentation

## 2021-01-24 DIAGNOSIS — I4819 Other persistent atrial fibrillation: Secondary | ICD-10-CM | POA: Insufficient documentation

## 2021-01-24 DIAGNOSIS — R42 Dizziness and giddiness: Secondary | ICD-10-CM

## 2021-01-24 DIAGNOSIS — D6869 Other thrombophilia: Secondary | ICD-10-CM

## 2021-01-24 DIAGNOSIS — R000 Tachycardia, unspecified: Secondary | ICD-10-CM

## 2021-01-24 HISTORY — PX: CARDIOVERSION: SHX1299

## 2021-01-24 SURGERY — CARDIOVERSION
Anesthesia: General

## 2021-01-24 MED ORDER — PROPOFOL 10 MG/ML IV BOLUS
INTRAVENOUS | Status: AC
  Filled 2021-01-24: qty 40

## 2021-01-24 MED ORDER — PROPOFOL 10 MG/ML IV BOLUS
INTRAVENOUS | Status: DC | PRN
  Administered 2021-01-24: 50 mg via INTRAVENOUS

## 2021-01-24 MED ORDER — SODIUM CHLORIDE 0.9 % IV SOLN: INTRAVENOUS | Status: DC

## 2021-01-24 NOTE — Progress Notes (Signed)
Dr. Fletcher Anon by bedside again to speak with pt. And his daugher re: cardioversion result. Both verbalize understanding of conversation.

## 2021-01-24 NOTE — Anesthesia Postprocedure Evaluation (Signed)
Anesthesia Post Note  Patient: Timothy Wells  Procedure(s) Performed: CARDIOVERSION  Patient location: cardiovascular suite. Anesthesia Type: General Level of consciousness: awake and alert Pain management: pain level controlled Vital Signs Assessment: post-procedure vital signs reviewed and stable Respiratory status: spontaneous breathing, nonlabored ventilation and respiratory function stable Cardiovascular status: blood pressure returned to baseline and stable Postop Assessment: no apparent nausea or vomiting Anesthetic complications: no   No notable events documented.   Last Vitals:  Vitals:   01/24/21 0734 01/24/21 0735  BP:    Pulse: 66 67  Resp: 12 12  Temp:    SpO2: 100% 99%    Last Pain:  Vitals:   01/24/21 0745  TempSrc:   PainSc: 0-No pain                 Iran Ouch

## 2021-01-24 NOTE — Interval H&P Note (Signed)
History and Physical Interval Note:  01/24/2021 8:03 AM  Timothy Wells  has presented today for surgery, with the diagnosis of Cardioversion  Afib     Sonia Baller spoke w Anesthesia Kathrin Ruddy) [place] 2nd case.  The various methods of treatment have been discussed with the patient and family. After consideration of risks, benefits and other options for treatment, the patient has consented to  Procedure(s): CARDIOVERSION (N/A) as a surgical intervention.  The patient's history has been reviewed, patient examined, no change in status, stable for surgery.  I have reviewed the patient's chart and labs.  Questions were answered to the patient's satisfaction.     Kathlyn [place]

## 2021-01-24 NOTE — Transfer of Care (Signed)
Immediate Anesthesia Transfer of Care Note  Patient: Timothy Wells  Procedure(s) Performed: CARDIOVERSION  Patient Location: PACU and Cath Lab  Anesthesia Type:General  Level of Consciousness: drowsy  Airway & Oxygen Therapy: Patient Spontanous Breathing and Patient connected to nasal cannula oxygen  Post-op Assessment: Report given to RN  Post vital signs: stable  Last Vitals:  Vitals Value Taken Time  BP 108/77 01/24/21 0733  Temp    Pulse 69 01/24/21 0736  Resp 18 01/24/21 0736  SpO2 100 % 01/24/21 0736  Vitals shown include unvalidated device data.  Last Pain:  Vitals:   01/24/21 0655  TempSrc: Oral         Complications: No notable events documented.

## 2021-01-24 NOTE — CV Procedure (Signed)
Cardioversion note: A standard informed consent was obtained. Timeout was performed. The pads were placed in the anterior posterior fashion. The patient was given propofol by the anesthesia team.  Successful cardioversion was performed with a 200 J. The patient converted to sinus rhythm. Pre-and post EKGs were reviewed. The patient tolerated the procedure with no immediate complications.  Recommendations: Continue same medications and follow-up in 2-3 weeks.  

## 2021-02-02 DIAGNOSIS — I480 Paroxysmal atrial fibrillation: Secondary | ICD-10-CM | POA: Diagnosis not present

## 2021-02-02 DIAGNOSIS — R0602 Shortness of breath: Secondary | ICD-10-CM | POA: Diagnosis not present

## 2021-02-02 DIAGNOSIS — I10 Essential (primary) hypertension: Secondary | ICD-10-CM | POA: Diagnosis not present

## 2021-02-02 DIAGNOSIS — R600 Localized edema: Secondary | ICD-10-CM | POA: Diagnosis not present

## 2021-02-02 DIAGNOSIS — Z9889 Other specified postprocedural states: Secondary | ICD-10-CM | POA: Diagnosis not present

## 2021-02-02 DIAGNOSIS — R000 Tachycardia, unspecified: Secondary | ICD-10-CM | POA: Diagnosis not present

## 2021-02-02 DIAGNOSIS — Z8679 Personal history of other diseases of the circulatory system: Secondary | ICD-10-CM | POA: Diagnosis not present

## 2021-02-02 DIAGNOSIS — E782 Mixed hyperlipidemia: Secondary | ICD-10-CM | POA: Diagnosis not present

## 2021-02-02 DIAGNOSIS — Z8673 Personal history of transient ischemic attack (TIA), and cerebral infarction without residual deficits: Secondary | ICD-10-CM | POA: Diagnosis not present

## 2021-02-02 DIAGNOSIS — E119 Type 2 diabetes mellitus without complications: Secondary | ICD-10-CM | POA: Diagnosis not present

## 2021-02-02 DIAGNOSIS — R002 Palpitations: Secondary | ICD-10-CM | POA: Diagnosis not present

## 2021-04-19 NOTE — Progress Notes (Signed)
Electrophysiology Office Follow up Visit Note:    Date:  04/20/2021   ID:  Timothy Wells, DOB 1950-05-24, MRN [id number]  PCP:  Timothy Maudlin, NP  [facility] Cardiologist:  None  [facility] Electrophysiologist:  Timothy Epley, MD    Interval History:    [name] is a 71 y.o. male who presents for a follow up visit. They were last seen in clinic 01/19/2021. He had a prior AF ablation and had recurrence. During AF ablation extensive scarring was noted in the LA. He is taking amiodarone for his arrhythmia.  He is back on his amiodarone 200 mg by mouth once daily but is very interested in stopping this medication long-term.  He is concerned about the potential for off target effects with the amiodarone.  He continues to take Eliquis for stroke prophylaxis.  He checks his blood pressures and heart rates daily and brings in his reports for review.  He tells me that while he takes amiodarone he feels worse than when he was previously in sinus rhythm off amiodarone.  He is staying active and continues to work part-time.       Past Medical History:  Diagnosis Date   A-fib ([place])    Anemia    Diabetes mellitus without complication (HCC)    type 2   Dysrhythmia    wide complex tachycardia   Edema of both lower legs    Memory loss    Restless leg syndrome    Skin cancer    skin cancer face basal cell   Stroke ([place]) 2019    Past Surgical History:  Procedure Laterality Date   ATRIAL FIBRILLATION ABLATION N/A 07/23/2020   Procedure: ATRIAL FIBRILLATION ABLATION;  Surgeon: Timothy Epley, MD;  Location: [facility];  Service: Cardiovascular;  Laterality: N/A;   CARDIOVERSION N/A 03/25/2019   Procedure: CARDIOVERSION;  Surgeon: Timothy Kida, MD;  Location: [facility];  Service: Cardiovascular;  Laterality: N/A;   CARDIOVERSION N/A 05/18/2020   Procedure: CARDIOVERSION;  Surgeon: Timothy Kida, MD;  Location: [facility];  Service: Cardiovascular;   Laterality: N/A;   CARDIOVERSION N/A 01/24/2021   Procedure: CARDIOVERSION;  Surgeon: Timothy Hampshire, MD;  Location: [facility];  Service: Cardiovascular;  Laterality: N/A;   COLONOSCOPY WITH PROPOFOL N/A 09/10/2019   Procedure: COLONOSCOPY WITH PROPOFOL;  Surgeon: Timothy Bellow, MD;  Location: [facility];  Service: Endoscopy;  Laterality: N/A;   SHOULDER ARTHROSCOPY WITH SUBACROMIAL DECOMPRESSION AND OPEN ROTATOR C Left 03/08/2020   Procedure: Left shoulder arthroscopic subscapularis repair and biodegradable balloon spacer placement with partial infraspinatus repair;  Surgeon: Timothy Fabry, MD;  Location: [facility];  Service: Orthopedics;  Laterality: Left;   TEE WITHOUT CARDIOVERSION N/A 07/23/2020   Procedure: TRANSESOPHAGEAL ECHOCARDIOGRAM (TEE);  Surgeon: Timothy Pain, MD;  Location: [facility]OS[facility];  Service: Cardiovascular;  Laterality: N/A;    Current Medications: Current Meds  Medication Sig   acetaminophen (TYLENOL) 500 MG tablet Take 1,000 mg by mouth every 6 (six) hours as needed for mild Wells or headache.   amiodarone (PACERONE) 200 MG tablet Take 200 mg by mouth daily.   apixaban (ELIQUIS) 5 MG TABS tablet Take 5 mg by mouth 2 (two) times daily.   atenolol (TENORMIN) 50 MG tablet Take 50 mg by mouth daily.   atorvastatin (LIPITOR) 80 MG tablet Take 80 mg by mouth every evening.    Cholecalciferol (VITAMIN D) 50 MCG (2000 UT) CAPS Take 4,000 Units by mouth  daily.   Chromium-Cinnamon (CINNAMON PLUS CHROMIUM PO) Take 1 tablet by mouth 2 (two) times daily. 500 mg / 100 mg   CRANBERRY EXTRACT PO Take 500 mg by mouth daily.   diltiazem (CARDIZEM CD) 360 MG 24 hr capsule Take 1 capsule (360 mg total) by mouth daily.   docusate sodium (COLACE) 100 MG capsule Take 100 mg by mouth 2 (two) times daily.   furosemide (LASIX) 20 MG tablet Take 20 mg by mouth daily.   Glucosamine-Chondroitin (COSAMIN DS PO) Take 1 tablet by mouth 2 (two) times daily.   lisinopril (ZESTRIL) 10  MG tablet Take 10 mg by mouth daily.   magnesium oxide (MAG-OX) 400 (241.3 Mg) MG tablet Take 1 tablet (400 mg total) by mouth daily.   Multiple Vitamin (MULTIVITAMIN WITH MINERALS) TABS tablet Take 1 tablet by mouth daily.   Omega-3 Fatty Acids (FISH OIL) 1000 MG CAPS Take 1,000 mg by mouth daily. DHA   omeprazole (PRILOSEC) 20 MG capsule Take 20 mg by mouth daily.   OVER THE COUNTER MEDICATION Take 5 mLs by mouth daily. Super Beets power   rOPINIRole (REQUIP) 1 MG tablet Take 1 mg by mouth at bedtime.   tamsulosin (FLOMAX) 0.4 MG CAPS capsule Take 0.4 mg by mouth at bedtime.   Turmeric Curcumin 500 MG CAPS Take 500 mg by mouth 2 (two) times daily.   vitamin B-12 (CYANOCOBALAMIN) 1000 MCG tablet Take 1,000 mcg by mouth daily.   vitamin C (ASCORBIC ACID) 500 MG tablet Take 500 mg by mouth daily.   vitamin E 400 UNIT capsule Take 400 Units by mouth daily.   Zinc 50 MG TABS Take 50 mg by mouth daily.     Allergies:   Patient has no known allergies.   Social History   Socioeconomic History   Marital status: Single    Spouse name: Not on file   Number of children: 2   Years of education: Not on file   Highest education level: Not on file  Occupational History   Not on file  Tobacco Use   Smoking status: Never   Smokeless tobacco: Never  Vaping Use   Vaping Use: Never used  Substance and Sexual Activity   Alcohol use: Yes    Comment: rare   Drug use: Not Currently   Sexual activity: Not on file  Other Topics Concern   Not on file  Social History Narrative   Lives by himself; daughter lives about 15 miles away.    Social Determinants of Health   Financial Resource Strain: Not on file  Food Insecurity: Not on file  Transportation Needs: Not on file  Physical Activity: Not on file  Stress: Not on file  Social Connections: Not on file     Family History: The patient's family history is not on file.  ROS:   Please see the history of present illness.    All other systems  reviewed and are negative.  EKGs/Labs/Other Studies Reviewed:    The following studies were reviewed today:  Prior ablation records  EKG:  The ekg ordered today demonstrates sinus rhythm.  Recent Labs: 01/02/2021: BUN 22; Creatinine, Ser 0.78; Hemoglobin 11.9; Platelets 220; Potassium 4.0; Sodium 130  Recent Lipid Panel No results found for: CHOL, TRIG, HDL, CHOLHDL, VLDL, LDLCALC, LDLDIRECT  Physical Exam:    VS:  BP 102/60 (BP Location: Left Arm, Patient Position: Sitting, Cuff Size: Normal)    Pulse 66    Ht $R'5\' 6"'xV$  (1.676 m)  Wt 173 lb (78.5 kg)    SpO2 98%    BMI 27.92 kg/m     Wt Readings from Last 3 Encounters:  04/20/21 173 lb (78.5 kg)  01/24/21 173 lb 12.8 oz (78.8 kg)  01/19/21 173 lb 12.8 oz (78.8 kg)     GEN:  Well nourished, well developed in no acute distress HEENT: Normal NECK: No JVD; No carotid bruits LYMPHATICS: No lymphadenopathy CARDIAC: RRR, no murmurs, rubs, gallops RESPIRATORY:  Clear to auscultation without rales, wheezing or rhonchi  ABDOMEN: Soft, non-tender, non-distended MUSCULOSKELETAL:  No edema; No deformity  SKIN: Warm and dry NEUROLOGIC:  Alert and oriented x 3 PSYCHIATRIC:  Normal affect        ASSESSMENT:    1. Atypical atrial flutter (HCC)   2. Persistent atrial fibrillation ([place])   3. Primary hypertension    PLAN:    In order of problems listed above:  #Persistent atrial fibrillation #Atypical atrial flutter Patient is maintaining sinus rhythm on amiodarone.  He takes Eliquis for stroke prophylaxis.  He is very interested in avoiding long-term exposure to amiodarone given the potential for off target effects.  I think this is a very reasonable desire.  We discussed the possibilities of stopping the medication and monitoring versus performing a redo ablation in hopes of maintaining normal rhythm off amiodarone.  He would like to proceed with a redo ablation.    During the ablation, would plan to touch of the veins and  posterior wall.  I would also ablate a peri-mitral atypical atrial flutter given what was observed during the first EP study and ablation.  We will also ablate the CTI given the flutters observed during the first ablation.  He will continue the amiodarone for now.  We will plan to stop the amiodarone 3 months after the ablation.  Risk, benefits, and alternatives to EP study and radiofrequency ablation for afib/flutter were also discussed in detail today. These risks include but are not limited to stroke, bleeding, vascular damage, tamponade, perforation, damage to the esophagus, lungs, and other structures, pulmonary vein stenosis, worsening renal function, and death. The patient understands these risk and wishes to proceed.  We will therefore proceed with catheter ablation at the next available time.  Carto, ICE, anesthesia are requested for the procedure.  Will also obtain CT PV protocol prior to the procedure to exclude LAA thrombus and further evaluate atrial anatomy.   #Hypertension Controlled.  He brings in his log today which show blood pressures have been controlled at home.    Total time spent with patient today 45 minutes. This includes reviewing records, evaluating the patient and coordinating care.   Medication Adjustments/Labs and Tests Ordered: Current medicines are reviewed at length with the patient today.  Concerns regarding medicines are outlined above.  Orders Placed This Encounter  Procedures   Comp Met (CMET)   TSH   T4, free   EKG 12-Lead   No orders of the defined types were placed in this encounter.    Signed, Lars Mage, MD, [facility], [facility] 04/20/2021 10:08 AM    Electrophysiology [facility]

## 2021-04-20 ENCOUNTER — Encounter: Payer: Self-pay | Admitting: *Deleted

## 2021-04-20 ENCOUNTER — Ambulatory Visit: Payer: PPO | Admitting: Cardiology

## 2021-04-20 ENCOUNTER — Other Ambulatory Visit: Payer: Self-pay

## 2021-04-20 ENCOUNTER — Encounter: Payer: Self-pay | Admitting: Cardiology

## 2021-04-20 VITALS — BP 102/60 | HR 66 | Ht 66.0 in | Wt 173.0 lb

## 2021-04-20 DIAGNOSIS — I10 Essential (primary) hypertension: Secondary | ICD-10-CM

## 2021-04-20 DIAGNOSIS — I484 Atypical atrial flutter: Secondary | ICD-10-CM | POA: Diagnosis not present

## 2021-04-20 DIAGNOSIS — I4819 Other persistent atrial fibrillation: Secondary | ICD-10-CM

## 2021-04-20 DIAGNOSIS — I4891 Unspecified atrial fibrillation: Secondary | ICD-10-CM

## 2021-04-20 DIAGNOSIS — Z01818 Encounter for other preprocedural examination: Secondary | ICD-10-CM | POA: Diagnosis not present

## 2021-04-20 NOTE — Patient Instructions (Addendum)
Medications: Your physician recommends that you continue on your current medications as directed. Please refer to the Current Medication list given to you today. *If you need a refill on your cardiac medications before your next appointment, please call your pharmacy*  Lab Work: CMP, TSH, Free T4 If you have labs (blood work) drawn today and your tests are completely normal, you will receive your results only by: [place] (if you have MyChart) OR A paper copy in the mail If you have any lab test that is abnormal or we need to change your treatment, we will call you to review the results.  Testing/Procedures: Your physician has requested that you have cardiac CT. Cardiac computed tomography (CT) is a painless test that uses an x-ray machine to take clear, detailed pictures of your heart. For further information please visit [email or web address]. Please follow instruction sheet as given.  Your physician has recommended that you have an ablation. Catheter ablation is a medical procedure used to treat some cardiac arrhythmias (irregular heartbeats). During catheter ablation, a long, thin, flexible tube is put into a blood vessel in your groin (upper thigh), or neck. This tube is called an ablation catheter. It is then guided to your heart through the blood vessel. Radio frequency waves destroy small areas of heart tissue where abnormal heartbeats may cause an arrhythmia to start. Please see the instruction sheet given to you today.   Follow-Up: At [facility], you and your health needs are our priority.  As part of our continuing mission to provide you with exceptional heart care, we have created designated Provider Care Teams.  These Care Teams include your primary Cardiologist (physician) and Advanced Practice Providers (APPs -  Physician Assistants and Nurse Practitioners) who all work together to provide you with the care you need, when you need it.  Your physician wants you to follow-up  in:  (Procedure May 4)  You will follow up with the Afib clinic 4 weeks after your procedure. You will follow up with Dr. Quentin Ore 3 months after your procedure.  We recommend signing up for the patient portal called "MyChart".  Sign up information is provided on this After Visit Summary.  MyChart is used to connect with patients for Virtual Visits (Telemedicine).  Patients are able to view lab/test results, encounter notes, upcoming appointments, etc.  Non-urgent messages can be sent to your provider as well.   To learn more about what you can do with MyChart, go to [email or web address].    Any Other Special Instructions Will Be Listed Below (If Applicable).   Cardiac Ablation Cardiac ablation is a procedure to destroy (ablate) some heart tissue that is sending bad signals. These bad signals cause problems in heart rhythm. The heart has many areas that make these signals. If there are problems in these areas, they can make the heart beat in a way that is not normal. Destroying some tissues can help make the heart rhythm normal. Tell your doctor about: Any allergies you have. All medicines you are taking. These include vitamins, herbs, eye drops, creams, and over-the-counter medicines. Any problems you or family members have had with medicines that make you fall asleep (anesthetics). Any blood disorders you have. Any surgeries you have had. Any medical conditions you have, such as kidney failure. Whether you are pregnant or may be pregnant. What are the risks? This is a safe procedure. But problems may occur, including: Infection. Bruising and bleeding. Bleeding into the chest. Stroke or blood clots. Damage to nearby areas  of your body. Allergies to medicines or dyes. The need for a pacemaker if the normal system is damaged. Failure of the procedure to treat the problem. What happens before the procedure? Medicines Ask your doctor about: Changing or stopping your normal medicines.  This is important. Taking aspirin and ibuprofen. Do not take these medicines unless your doctor tells you to take them. Taking other medicines, vitamins, herbs, and supplements. General instructions Follow instructions from your doctor about what you cannot eat or drink. Plan to have someone take you home from the hospital or clinic. If you will be going home right after the procedure, plan to have someone with you for 24 hours. Ask your doctor what steps will be taken to prevent infection. What happens during the procedure?  An IV tube will be put into one of your veins. You will be given a medicine to help you relax. The skin on your neck or groin will be numbed. A cut (incision) will be made in your neck or groin. A needle will be put through your cut and into a large vein. A tube (catheter) will be put into the needle. The tube will be moved to your heart. Dye may be put through the tube. This helps your doctor see your heart. Small devices (electrodes) on the tube will send out signals. A type of energy will be used to destroy some heart tissue. The tube will be taken out. Pressure will be held on your cut. This helps stop bleeding. A bandage will be put over your cut. The exact procedure may vary among doctors and hospitals. What happens after the procedure? You will be watched until you leave the hospital or clinic. This includes checking your heart rate, breathing rate, oxygen, and blood pressure. Your cut will be watched for bleeding. You will need to lie still for a few hours. Do not drive for 24 hours or as long as your doctor tells you. Summary Cardiac ablation is a procedure to destroy some heart tissue. This is done to treat heart rhythm problems. Tell your doctor about any medical conditions you may have. Tell him or her about all medicines you are taking to treat them. This is a safe procedure. But problems may occur. These include infection, bruising, bleeding, and  damage to nearby areas of your body. Follow what your doctor tells you about food and drink. You may also be told to change or stop some of your medicines. After the procedure, do not drive for 24 hours or as long as your doctor tells you. This information is not intended to replace advice given to you by your health care provider. Make sure you discuss any questions you have with your health care provider. Document Revised: 01/16/2019 Document Reviewed: 01/16/2019 Elsevier Patient Education  2022 [employer].

## 2021-04-21 LAB — COMPREHENSIVE METABOLIC PANEL
ALT: 18 IU/L (ref 0–44)
AST: 21 IU/L (ref 0–40)
Albumin/Globulin Ratio: 1.9 (ref 1.2–2.2)
Albumin: 4.1 g/dL (ref 3.8–4.8)
Alkaline Phosphatase: 81 IU/L (ref 44–121)
BUN/Creatinine Ratio: 19 (ref 10–24)
BUN: 14 mg/dL (ref 8–27)
Bilirubin Total: 0.4 mg/dL (ref 0.0–1.2)
CO2: 18 mmol/L — ABNORMAL LOW (ref 20–29)
Calcium: 8.8 mg/dL (ref 8.6–10.2)
Chloride: 98 mmol/L (ref 96–106)
Creatinine, Ser: 0.74 mg/dL — ABNORMAL LOW (ref 0.76–1.27)
Globulin, Total: 2.2 g/dL (ref 1.5–4.5)
Glucose: 102 mg/dL — ABNORMAL HIGH (ref 70–99)
Potassium: 4.6 mmol/L (ref 3.5–5.2)
Sodium: 134 mmol/L (ref 134–144)
Total Protein: 6.3 g/dL (ref 6.0–8.5)
eGFR: 99 mL/min/{1.73_m2} (ref >59)

## 2021-04-21 LAB — T4, FREE: Free T4: 1.4 ng/dL (ref 0.82–1.77)

## 2021-04-21 LAB — TSH: TSH: 3.59 u[IU]/mL (ref 0.450–4.500)

## 2021-06-07 ENCOUNTER — Other Ambulatory Visit: Payer: PPO

## 2021-06-24 ENCOUNTER — Telehealth (HOSPITAL_COMMUNITY): Payer: Self-pay | Admitting: Emergency Medicine

## 2021-06-24 NOTE — Telephone Encounter (Signed)
Reaching out to patient to offer assistance regarding upcoming cardiac imaging study; pt verbalizes understanding of appt date/time, parking situation and where to check in, pre-test NPO status and medications ordered, and verified current allergies; name and call back number provided for further questions should they arise ?Timothy Bond RN Navigator Cardiac Imaging ? Heart and Vascular ?[phone] office ?[phone] cell ? ?Daily meds except lasix ?Arrival 845 ?Denies iv issues ? ?

## 2021-06-27 ENCOUNTER — Ambulatory Visit
Admission: RE | Admit: 2021-06-27 | Discharge: 2021-06-27 | Disposition: A | Discharge status: Home / Self Care | Payer: PPO | Source: Ambulatory Visit | Attending: Cardiology | Admitting: Cardiology

## 2021-06-27 DIAGNOSIS — I4891 Unspecified atrial fibrillation: Secondary | ICD-10-CM

## 2021-06-27 MED ORDER — IOHEXOL 350 MG/ML SOLN
75.0000 mL | Freq: Once | INTRAVENOUS | Status: AC | PRN
  Administered 2021-06-27: 75 mL via INTRAVENOUS

## 2021-06-29 NOTE — Pre-Procedure Instructions (Signed)
Instructed patient on the following items: Arrival time 0830 Nothing to eat or drink after midnight No meds AM of procedure Responsible person to drive you home and stay with you for 24 hrs  Have you missed any doses of anti-coagulant Eliquis- hasn't missed any doses   

## 2021-06-30 ENCOUNTER — Ambulatory Visit (HOSPITAL_COMMUNITY)
Admission: RE | Disposition: A | Discharge status: Home / Self Care | Payer: Self-pay | Source: Home / Self Care | Attending: Cardiology

## 2021-06-30 ENCOUNTER — Ambulatory Visit (HOSPITAL_COMMUNITY): Payer: PPO | Admitting: Certified Registered Nurse Anesthetist

## 2021-06-30 ENCOUNTER — Ambulatory Visit (HOSPITAL_BASED_OUTPATIENT_CLINIC_OR_DEPARTMENT_OTHER): Payer: PPO | Admitting: Certified Registered Nurse Anesthetist

## 2021-06-30 ENCOUNTER — Ambulatory Visit (HOSPITAL_COMMUNITY)
Admission: RE | Admit: 2021-06-30 | Discharge: 2021-06-30 | Disposition: A | Discharge status: Home / Self Care | Payer: PPO | Attending: Cardiology | Admitting: Cardiology

## 2021-06-30 DIAGNOSIS — Z79899 Other long term (current) drug therapy: Secondary | ICD-10-CM | POA: Diagnosis not present

## 2021-06-30 DIAGNOSIS — Z8673 Personal history of transient ischemic attack (TIA), and cerebral infarction without residual deficits: Secondary | ICD-10-CM | POA: Diagnosis not present

## 2021-06-30 DIAGNOSIS — I10 Essential (primary) hypertension: Secondary | ICD-10-CM | POA: Diagnosis not present

## 2021-06-30 DIAGNOSIS — Z7901 Long term (current) use of anticoagulants: Secondary | ICD-10-CM | POA: Insufficient documentation

## 2021-06-30 DIAGNOSIS — I4819 Other persistent atrial fibrillation: Secondary | ICD-10-CM | POA: Diagnosis present

## 2021-06-30 DIAGNOSIS — I484 Atypical atrial flutter: Secondary | ICD-10-CM | POA: Diagnosis not present

## 2021-06-30 DIAGNOSIS — I483 Typical atrial flutter: Secondary | ICD-10-CM | POA: Insufficient documentation

## 2021-06-30 DIAGNOSIS — E119 Type 2 diabetes mellitus without complications: Secondary | ICD-10-CM | POA: Diagnosis not present

## 2021-06-30 DIAGNOSIS — I4891 Unspecified atrial fibrillation: Secondary | ICD-10-CM

## 2021-06-30 HISTORY — PX: ATRIAL FIBRILLATION ABLATION: EP1191

## 2021-06-30 LAB — GLUCOSE, CAPILLARY: Glucose-Capillary: 97 mg/dL (ref 70–99)

## 2021-06-30 SURGERY — ATRIAL FIBRILLATION ABLATION
Anesthesia: General

## 2021-06-30 MED ORDER — COLCHICINE 0.6 MG PO TABS: 0.6000 mg | ORAL_TABLET | Freq: Two times a day (BID) | ORAL | 0 refills | Status: DC

## 2021-06-30 MED ORDER — FENTANYL CITRATE (PF) 100 MCG/2ML IJ SOLN
INTRAMUSCULAR | Status: DC | PRN
  Administered 2021-06-30: 50 ug via INTRAVENOUS

## 2021-06-30 MED ORDER — SODIUM CHLORIDE 0.9 % IV SOLN: INTRAVENOUS | Status: DC

## 2021-06-30 MED ORDER — HEPARIN (PORCINE) IN NACL 1000-0.9 UT/500ML-% IV SOLN
INTRAVENOUS | Status: AC
  Filled 2021-06-30: qty 500

## 2021-06-30 MED ORDER — PHENYLEPHRINE HCL-NACL 20-0.9 MG/250ML-% IV SOLN
INTRAVENOUS | Status: DC | PRN
  Administered 2021-06-30: 25 ug/min via INTRAVENOUS

## 2021-06-30 MED ORDER — HEPARIN SODIUM (PORCINE) 1000 UNIT/ML IJ SOLN
INTRAMUSCULAR | Status: DC | PRN
  Administered 2021-06-30: 5000 [IU] via INTRAVENOUS
  Administered 2021-06-30: 6000 [IU] via INTRAVENOUS
  Administered 2021-06-30: 12000 [IU] via INTRAVENOUS

## 2021-06-30 MED ORDER — MIDAZOLAM HCL 2 MG/2ML IJ SOLN
INTRAMUSCULAR | Status: DC | PRN
Start: 2021-06-30 — End: 2021-06-30
  Administered 2021-06-30: 2 mg via INTRAVENOUS

## 2021-06-30 MED ORDER — ONDANSETRON HCL 4 MG/2ML IJ SOLN
INTRAMUSCULAR | Status: DC | PRN
Start: 2021-06-30 — End: 2021-06-30
  Administered 2021-06-30: 4 mg via INTRAVENOUS

## 2021-06-30 MED ORDER — LIDOCAINE 2% (20 MG/ML) 5 ML SYRINGE
INTRAMUSCULAR | Status: DC | PRN
  Administered 2021-06-30: 60 mg via INTRAVENOUS

## 2021-06-30 MED ORDER — ISOPROTERENOL HCL 0.2 MG/ML IJ SOLN
INTRAMUSCULAR | Status: AC
  Filled 2021-06-30: qty 5

## 2021-06-30 MED ORDER — HEPARIN (PORCINE) IN NACL 1000-0.9 UT/500ML-% IV SOLN
INTRAVENOUS | Status: DC | PRN
Start: 2021-06-30 — End: 2021-06-30
  Administered 2021-06-30 (×3): 500 mL

## 2021-06-30 MED ORDER — ACETAMINOPHEN 500 MG PO TABS
1000.0000 mg | ORAL_TABLET | Freq: Once | ORAL | Status: AC
  Administered 2021-06-30: 1000 mg via ORAL
  Filled 2021-06-30: qty 2

## 2021-06-30 MED ORDER — HEPARIN SODIUM (PORCINE) 1000 UNIT/ML IJ SOLN
INTRAMUSCULAR | Status: AC
  Filled 2021-06-30: qty 10

## 2021-06-30 MED ORDER — ONDANSETRON HCL 4 MG/2ML IJ SOLN
4.0000 mg | Freq: Four times a day (QID) | INTRAMUSCULAR | Status: DC | PRN
Start: 2021-06-30 — End: 2021-06-30

## 2021-06-30 MED ORDER — SODIUM CHLORIDE 0.9 % IV SOLN: 250.0000 mL | INTRAVENOUS | Status: DC | PRN

## 2021-06-30 MED ORDER — ACETAMINOPHEN 325 MG PO TABS: 650.0000 mg | ORAL_TABLET | ORAL | Status: DC | PRN

## 2021-06-30 MED ORDER — DEXAMETHASONE SODIUM PHOSPHATE 10 MG/ML IJ SOLN
INTRAMUSCULAR | Status: DC | PRN
  Administered 2021-06-30: 5 mg via INTRAVENOUS

## 2021-06-30 MED ORDER — SUGAMMADEX SODIUM 200 MG/2ML IV SOLN
INTRAVENOUS | Status: DC | PRN
  Administered 2021-06-30: 400 mg via INTRAVENOUS

## 2021-06-30 MED ORDER — ROCURONIUM BROMIDE 10 MG/ML (PF) SYRINGE
PREFILLED_SYRINGE | INTRAVENOUS | Status: DC | PRN
  Administered 2021-06-30: 20 mg via INTRAVENOUS
  Administered 2021-06-30: 30 mg via INTRAVENOUS
  Administered 2021-06-30: 20 mg via INTRAVENOUS
  Administered 2021-06-30: 50 mg via INTRAVENOUS

## 2021-06-30 MED ORDER — APIXABAN 5 MG PO TABS
5.0000 mg | ORAL_TABLET | Freq: Two times a day (BID) | ORAL | Status: DC
  Administered 2021-06-30: 5 mg via ORAL
  Filled 2021-06-30: qty 1

## 2021-06-30 MED ORDER — SODIUM CHLORIDE 0.9% FLUSH: 3.0000 mL | INTRAVENOUS | Status: DC | PRN

## 2021-06-30 MED ORDER — PANTOPRAZOLE SODIUM 40 MG PO TBEC
40.0000 mg | DELAYED_RELEASE_TABLET | Freq: Every day | ORAL | Status: DC
Start: 2021-06-30 — End: 2021-06-30
  Administered 2021-06-30: 40 mg via ORAL
  Filled 2021-06-30: qty 1

## 2021-06-30 MED ORDER — HEPARIN SODIUM (PORCINE) 1000 UNIT/ML IJ SOLN
INTRAMUSCULAR | Status: DC | PRN
  Administered 2021-06-30: 1000 [IU] via INTRAVENOUS

## 2021-06-30 MED ORDER — PANTOPRAZOLE SODIUM 40 MG PO TBEC: 40.0000 mg | DELAYED_RELEASE_TABLET | Freq: Every day | ORAL | 0 refills | Status: DC

## 2021-06-30 MED ORDER — PROTAMINE SULFATE 10 MG/ML IV SOLN
INTRAVENOUS | Status: DC | PRN
Start: 2021-06-30 — End: 2021-06-30
  Administered 2021-06-30: 30 mg via INTRAVENOUS

## 2021-06-30 MED ORDER — SODIUM CHLORIDE 0.9% FLUSH: 3.0000 mL | Freq: Two times a day (BID) | INTRAVENOUS | Status: DC

## 2021-06-30 MED ORDER — PROPOFOL 10 MG/ML IV BOLUS
INTRAVENOUS | Status: DC | PRN
  Administered 2021-06-30: 140 mg via INTRAVENOUS

## 2021-06-30 MED ORDER — COLCHICINE 0.6 MG PO TABS
0.6000 mg | ORAL_TABLET | Freq: Two times a day (BID) | ORAL | Status: DC
  Administered 2021-06-30: 0.6 mg via ORAL
  Filled 2021-06-30: qty 1

## 2021-06-30 SURGICAL SUPPLY — 17 items
CATH OCTARAY 2.0 F 3-3-3-3-3 (CATHETERS) ×1 IMPLANT
CATH S CIRCA THERM PROBE 10F (CATHETERS) ×1 IMPLANT
CATH SMTCH THERMOCOOL SF DF (CATHETERS) ×1 IMPLANT
CATH SOUNDSTAR ECO 8FR (CATHETERS) ×1 IMPLANT
CATH WEBSTER BI DIR CS D-F CRV (CATHETERS) ×1 IMPLANT
CLOSURE PERCLOSE PROSTYLE (VASCULAR PRODUCTS) ×3 IMPLANT
COVER SWIFTLINK CONNECTOR (BAG) ×2 IMPLANT
PACK EP LATEX FREE (CUSTOM PROCEDURE TRAY) ×2
PACK EP LF (CUSTOM PROCEDURE TRAY) ×1 IMPLANT
PAD DEFIB RADIO PHYSIO CONN (PAD) ×2 IMPLANT
PATCH CARTO3 (PAD) ×1 IMPLANT
SHEATH BAYLIS TRANSSEPTAL 98CM (NEEDLE) ×1 IMPLANT
SHEATH CARTO VIZIGO SM CVD (SHEATH) ×1 IMPLANT
SHEATH PINNACLE 8F 10CM (SHEATH) ×2 IMPLANT
SHEATH PINNACLE 9F 10CM (SHEATH) ×1 IMPLANT
SHEATH PROBE COVER 6X72 (BAG) ×1 IMPLANT
TUBING SMART ABLATE COOLFLOW (TUBING) ×1 IMPLANT

## 2021-06-30 NOTE — Anesthesia Procedure Notes (Signed)
Procedure Name: Intubation ?Date/Time: 06/30/2021 10:38 AM ?Performed by: Valda Favia, CRNA ?Pre-anesthesia Checklist: Patient identified, Emergency Drugs available, Suction available and Patient being monitored ?Patient Re-evaluated:Patient Re-evaluated prior to induction ?Oxygen Delivery Method: Circle System Utilized ?Preoxygenation: Pre-oxygenation with 100% oxygen ?Induction Type: IV induction ?Ventilation: Mask ventilation without difficulty and Oral airway inserted - appropriate to patient size ?Laryngoscope Size: Mac and 4 ?Grade View: Grade I ?Tube type: Oral ?Tube size: 7.5 mm ?Number of attempts: 1 ?Airway Equipment and Method: Stylet and Oral airway ?Placement Confirmation: ETT inserted through vocal cords under direct vision, positive ETCO2 and breath sounds checked- equal and bilateral ?Secured at: 22 cm ?Tube secured with: Tape ?Dental Injury: Teeth and Oropharynx as per pre-operative assessment  ? ? ? ? ?

## 2021-06-30 NOTE — Anesthesia Preprocedure Evaluation (Signed)
Anesthesia Evaluation  ?Patient identified by MRN, date of birth, ID band ?Patient awake ? ? ? ?Reviewed: ?Allergy & Precautions, NPO status , Patient's Chart, lab work & pertinent test results ? ?Airway ?Mallampati: II ? ?TM Distance: >3 FB ?Neck ROM: Full ? ? ? Dental ? ?(+) Dental Advisory Given ?  ?Pulmonary ?neg pulmonary ROS,  ?  ?breath sounds clear to auscultation ? ? ? ? ? ? Cardiovascular ?hypertension, Pt. on medications ?+ dysrhythmias Atrial Fibrillation  ?Rhythm:Regular Rate:Normal ? ? ?  ?Neuro/Psych ?CVA   ? GI/Hepatic ?Neg liver ROS, GERD  ,  ?Endo/Other  ?diabetes, Type 2 ? Renal/GU ?negative Renal ROS  ? ?  ?Musculoskeletal ? ? Abdominal ?  ?Peds ? Hematology ? ?(+) Blood dyscrasia, anemia ,   ?Anesthesia Other Findings ? ? Reproductive/Obstetrics ? ?  ? ? ? ? ? ? ? ? ? ? ? ? ? ?  ?  ? ? ? ? ? ? ? ? ?Anesthesia Physical ?Anesthesia Plan ? ?ASA: 3 ? ?Anesthesia Plan: General  ? ?Post-op Pain Management: Tylenol PO (pre-op)* and Minimal or no pain anticipated  ? ?Induction: Intravenous ? ?PONV Risk Score and Plan: 2 and Dexamethasone, Ondansetron and Treatment may vary due to age or medical condition ? ?Airway Management Planned: Oral ETT ? ?Additional Equipment: None ? ?Intra-op Plan:  ? ?Post-operative Plan: Extubation in OR ? ?Informed Consent: I have reviewed the patients History and Physical, chart, labs and discussed the procedure including the risks, benefits and alternatives for the proposed anesthesia with the patient or authorized representative who has indicated his/her understanding and acceptance.  ? ? ? ?Dental advisory given ? ?Plan Discussed with: CRNA ? ?Anesthesia Plan Comments:   ? ? ? ? ? ? ?Anesthesia Quick Evaluation ? ?

## 2021-06-30 NOTE — H&P (Signed)
?Electrophysiology Office Follow up Visit Note:   ?  ?Date:  06/30/2021  ?  ?ID:  Timothy Wells, DOB February 23, 1951, MRN [id number] ?  ?PCP:  Latanya Maudlin, NP          ?[facility] Cardiologist:  None  ?[facility] Electrophysiologist:  Vickie Epley, MD  ?  ?  ?Interval History:   ?  ?Timothy Wells is a 71 y.o. male who presents for a follow up visit. They were last seen in clinic 01/19/2021. He had a prior AF ablation and had recurrence. During AF ablation extensive scarring was noted in the LA. He is taking amiodarone for his arrhythmia.  He is back on his amiodarone 200 mg by mouth once daily but is very interested in stopping this medication long-term.  He is concerned about the potential for off target effects with the amiodarone.  He continues to take Eliquis for stroke prophylaxis.  He checks his blood pressures and heart rates daily and brings in his reports for review. ?  ?He tells me that while he takes amiodarone he feels worse than when he was previously in sinus rhythm off amiodarone.  He is staying active and continues to work part-time. ?  ? Presents today for redo PVI and flutter ablation. ?  ?  ?Objective  ?  ?  ?    ?Past Medical History:  ?Diagnosis Date  ? A-fib ([place])    ? Anemia    ? Diabetes mellitus without complication ([place])    ?  type 2  ? Dysrhythmia    ?  wide complex tachycardia  ? Edema of both lower legs    ? Memory loss    ? Restless leg syndrome    ? Skin cancer    ?  skin cancer face basal cell  ? Stroke [facility]) 2019  ?  ?  ?     ?Past Surgical History:  ?Procedure Laterality Date  ? ATRIAL FIBRILLATION ABLATION N/A 07/23/2020  ?  Procedure: ATRIAL FIBRILLATION ABLATION;  Surgeon: Vickie Epley, MD;  Location: [place] CV LAB;  Service: Cardiovascular;  Laterality: N/A;  ? CARDIOVERSION N/A 03/25/2019  ?  Procedure: CARDIOVERSION;  Surgeon: Yolonda Kida, MD;  Location: [facility] ORS;  Service: Cardiovascular;  Laterality: N/A;  ? CARDIOVERSION N/A 05/18/2020  ?   Procedure: CARDIOVERSION;  Surgeon: Yolonda Kida, MD;  Location: [facility] ORS;  Service: Cardiovascular;  Laterality: N/A;  ? CARDIOVERSION N/A 01/24/2021  ?  Procedure: CARDIOVERSION;  Surgeon: Wellington Hampshire, MD;  Location: [facility] ORS;  Service: Cardiovascular;  Laterality: N/A;  ? COLONOSCOPY WITH PROPOFOL N/A 09/10/2019  ?  Procedure: COLONOSCOPY WITH PROPOFOL;  Surgeon: Robert Bellow, MD;  Location: [facility] ENDOSCOPY;  Service: Endoscopy;  Laterality: N/A;  ? SHOULDER ARTHROSCOPY WITH SUBACROMIAL DECOMPRESSION AND OPEN ROTATOR C Left 03/08/2020  ?  Procedure: Left shoulder arthroscopic subscapularis repair and biodegradable balloon spacer placement with partial infraspinatus repair;  Surgeon: Leim Fabry, MD;  Location: [place];  Service: Orthopedics;  Laterality: Left;  ? TEE WITHOUT CARDIOVERSION N/A 07/23/2020  ?  Procedure: TRANSESOPHAGEAL ECHOCARDIOGRAM (TEE);  Surgeon: Jerline Pain, MD;  Location: [facility] ENDOSCOPY;  Service: Cardiovascular;  Laterality: N/A;  ?  ?  ?Current Medications: ?Active Medications  ?    ?Current Meds  ?Medication Sig  ? acetaminophen (TYLENOL) 500 MG tablet Take 1,000 mg by mouth every 6 (six) hours as needed for mild pain or headache.  ? amiodarone (PACERONE) 200  MG tablet Take 200 mg by mouth daily.  ? apixaban (ELIQUIS) 5 MG TABS tablet Take 5 mg by mouth 2 (two) times daily.  ? atenolol (TENORMIN) 50 MG tablet Take 50 mg by mouth daily.  ? atorvastatin (LIPITOR) 80 MG tablet Take 80 mg by mouth every evening.   ? Cholecalciferol (VITAMIN D) 50 MCG (2000 UT) CAPS Take 4,000 Units by mouth daily.  ? Chromium-Cinnamon (CINNAMON PLUS CHROMIUM PO) Take 1 tablet by mouth 2 (two) times daily. 500 mg / 100 mg  ? CRANBERRY EXTRACT PO Take 500 mg by mouth daily.  ? diltiazem (CARDIZEM CD) 360 MG 24 hr capsule Take 1 capsule (360 mg total) by mouth daily.  ? docusate sodium (COLACE) 100 MG capsule Take 100 mg by mouth 2 (two) times daily.  ? furosemide (LASIX) 20 MG tablet  Take 20 mg by mouth daily.  ? Glucosamine-Chondroitin (COSAMIN DS PO) Take 1 tablet by mouth 2 (two) times daily.  ? lisinopril (ZESTRIL) 10 MG tablet Take 10 mg by mouth daily.  ? magnesium oxide (MAG-OX) 400 (241.3 Mg) MG tablet Take 1 tablet (400 mg total) by mouth daily.  ? Multiple Vitamin (MULTIVITAMIN WITH MINERALS) TABS tablet Take 1 tablet by mouth daily.  ? Omega-3 Fatty Acids (FISH OIL) 1000 MG CAPS Take 1,000 mg by mouth daily. DHA  ? omeprazole (PRILOSEC) 20 MG capsule Take 20 mg by mouth daily.  ? OVER THE COUNTER MEDICATION Take 5 mLs by mouth daily. Super Beets power  ? rOPINIRole (REQUIP) 1 MG tablet Take 1 mg by mouth at bedtime.  ? tamsulosin (FLOMAX) 0.4 MG CAPS capsule Take 0.4 mg by mouth at bedtime.  ? Turmeric Curcumin 500 MG CAPS Take 500 mg by mouth 2 (two) times daily.  ? vitamin B-12 (CYANOCOBALAMIN) 1000 MCG tablet Take 1,000 mcg by mouth daily.  ? vitamin C (ASCORBIC ACID) 500 MG tablet Take 500 mg by mouth daily.  ? vitamin E 400 UNIT capsule Take 400 Units by mouth daily.  ? Zinc 50 MG TABS Take 50 mg by mouth daily.  ?  ?  ?  ?Allergies:   Patient has no known allergies.  ?  ?Social History  ?  ?     ?Socioeconomic History  ? Marital status: Single  ?    Spouse name: Not on file  ? Number of children: 2  ? Years of education: Not on file  ? Highest education level: Not on file  ?Occupational History  ? Not on file  ?Tobacco Use  ? Smoking status: Never  ? Smokeless tobacco: Never  ?Vaping Use  ? Vaping Use: Never used  ?Substance and Sexual Activity  ? Alcohol use: Yes  ?    Comment: rare  ? Drug use: Not Currently  ? Sexual activity: Not on file  ?Other Topics Concern  ? Not on file  ?Social History Narrative  ?  Lives by himself; daughter lives about 15 miles away.   ?  ?Social Determinants of Health  ?  ?Financial Resource Strain: Not on file  ?Food Insecurity: Not on file  ?Transportation Needs: Not on file  ?Physical Activity: Not on file  ?Stress: Not on file  ?Social  Connections: Not on file  ?  ?  ?Family History: ?The patient's family history is not on file. ?  ?ROS:   ?Please see the history of present illness.    ?All other systems reviewed and are negative. ?  ?EKGs/Labs/Other Studies Reviewed:   ?  ?  The following studies were reviewed today: ?  ?Prior ablation records ?  ?EKG:  The ekg ordered today demonstrates sinus rhythm. ?  ?Recent Labs: ?01/02/2021: BUN 22; Creatinine, Ser 0.78; Hemoglobin 11.9; Platelets 220; Potassium 4.0; Sodium 130  ?Recent Lipid Panel ?Labs (Brief)  ?No results found for: CHOL, TRIG, HDL, CHOLHDL, VLDL, LDLCALC, LDLDIRECT  ? ?  ?Physical Exam:   ?  ?VS:  BP 125/73 (BP Location: Left Arm, Patient Position: Sitting, Cuff Size: Normal)   Pulse 57   Ht '5\' 6"'$  (1.676 m)   Wt 173 lb (78.5 kg)   SpO2 98%   BMI 27.92 kg/m?    ?  ?   ?Wt Readings from Last 3 Encounters:  ?04/20/21 173 lb (78.5 kg)  ?01/24/21 173 lb 12.8 oz (78.8 kg)  ?01/19/21 173 lb 12.8 oz (78.8 kg)  ?  ?  ?GEN:  Well nourished, well developed in no acute distress ?HEENT: Normal ?NECK: No JVD; No carotid bruits ?LYMPHATICS: No lymphadenopathy ?CARDIAC: RRR, no murmurs, rubs, gallops ?RESPIRATORY:  Clear to auscultation without rales, wheezing or rhonchi  ?ABDOMEN: Soft, non-tender, non-distended ?MUSCULOSKELETAL:  No edema; No deformity  ?SKIN: Warm and dry ?NEUROLOGIC:  Alert and oriented x 3 ?PSYCHIATRIC:  Normal affect  ?  ?  ?  ?  ?  ?Assessment ?  ?  ?ASSESSMENT:   ?  ?1. Atypical atrial flutter ([place])   ?2. Persistent atrial fibrillation ([address])   ?3. Primary hypertension   ?  ?PLAN:   ?  ?In order of problems listed above: ?  ?#Persistent atrial fibrillation ?#Atypical atrial flutter ?Patient is maintaining sinus rhythm on amiodarone.  He takes Eliquis for stroke prophylaxis.  He is very interested in avoiding long-term exposure to amiodarone given the potential for off target effects.  I think this is a very reasonable desire.  We discussed the possibilities of stopping the  medication and monitoring versus performing a redo ablation in hopes of maintaining normal rhythm off amiodarone.  He would like to proceed with a redo ablation.   ?  ?During the ablation, would plan to touch of the veins

## 2021-06-30 NOTE — Discharge Instructions (Signed)

## 2021-06-30 NOTE — Progress Notes (Signed)
Pt ambulated to BR without difficulty with stand by assist-bilat groin dsg intact ?

## 2021-06-30 NOTE — Transfer of Care (Signed)
Immediate Anesthesia Transfer of Care Note ? ?Patient: Timothy Wells ? ?Procedure(s) Performed: ATRIAL FIBRILLATION ABLATION ? ?Patient Location: Cath Lab ? ?Anesthesia Type:General ? ?Level of Consciousness: awake, alert  and oriented ? ?Airway & Oxygen Therapy: Patient Spontanous Breathing and Patient connected to nasal cannula oxygen ? ?Post-op Assessment: Report given to RN and Post -op Vital signs reviewed and stable ? ?Post vital signs: Reviewed and stable ? ?Last Vitals:  ?Vitals Value Taken Time  ?BP 113/65 06/30/21 1319  ?Temp    ?Pulse 63 06/30/21 1322  ?Resp 0 06/30/21 1320  ?SpO2 99 % 06/30/21 1322  ?Vitals shown include unvalidated device data. ? ?Last Pain:  ?Vitals:  ? 06/30/21 0944  ?TempSrc:   ?PainSc: 0-No pain  ?   ? ?  ? ?Complications: There were no known notable events for this encounter. ?

## 2021-07-01 ENCOUNTER — Encounter (HOSPITAL_COMMUNITY): Payer: Self-pay | Admitting: Cardiology

## 2021-07-01 LAB — POCT ACTIVATED CLOTTING TIME
Activated Clotting Time: 245 seconds
Activated Clotting Time: 299 seconds
Activated Clotting Time: 347 seconds
Activated Clotting Time: 305 seconds

## 2021-07-01 MED FILL — Heparin Sod (Porcine)-NaCl IV Soln 1000 Unit/500ML-0.9%: INTRAVENOUS | Qty: 500 | Status: AC

## 2021-07-01 NOTE — Anesthesia Postprocedure Evaluation (Signed)
Anesthesia Post Note ? ?Patient: Khaliel Morey Bielby ? ?Procedure(s) Performed: ATRIAL FIBRILLATION ABLATION ? ?  ? ?Patient location during evaluation: PACU ?Anesthesia Type: General ?Level of consciousness: awake and alert ?Pain management: pain level controlled ?Vital Signs Assessment: post-procedure vital signs reviewed and stable ?Respiratory status: spontaneous breathing, nonlabored ventilation and respiratory function stable ?Cardiovascular status: stable and blood pressure returned to baseline ?Anesthetic complications: no ? ? ?There were no known notable events for this encounter. ? ?Last Vitals:  ?Vitals:  ? 06/30/21 1500 06/30/21 1715  ?BP: 112/67 130/80  ?Pulse: 66 (!) 59  ?Resp: (!) 21 15  ?Temp:    ?SpO2: 95% 97%  ?  ?Last Pain:  ?Vitals:  ? 06/30/21 1700  ?TempSrc:   ?PainSc: 0-No pain  ? ? ?  ?  ?  ?  ?  ?  ? ?Audry Pili ? ? ? ? ?

## 2021-07-27 NOTE — Progress Notes (Signed)
Primary Care Physician: Latanya Maudlin, NP Referring Physician: Dr. Quentin Ore Cardiologist: Dr. Elayne Guerin Timothy Wells is a 71 y.o. male with a h/o afib, DM, CVA, anemia, memory loss, bradycardia, hyperlipidemia. Use of amiodarone, and prior CV in March 2022. Due to potential side effects from amiodarone and his relative young age for long term use, he had an ablation with Dr. Quentin Ore 07/23/20. Patient reports that he started having an elevated heart rate on 11/15/20. There were no specific triggers identified. He does report symptoms of dizziness in afib. He spontaneously converted to SR and did not require DCCV. Unfortunately, he continued to have issues with his arrhythmias and did have DCCV on 01/24/21. He underwent repeat afib and flutter ablation on 06/30/21 with Dr Quentin Ore.   On follow up today, patient reports that he has done very well since ablation. He denies any tachypalpitations or dizziness. He denies CP, swallowing pain, or groin issues.   Today, he denies symptoms of palpitations, chest pain, shortness of breath, orthopnea, PND, lower extremity edema, presyncope, syncope, or neurologic sequela. The patient is tolerating medications without difficulties and is otherwise without complaint today.   Past Medical History:  Diagnosis Date   A-fib ([place])    Anemia    Diabetes mellitus without complication ([facility])    type 2   Dysrhythmia    wide complex tachycardia   Edema of both lower legs    Memory loss    Restless leg syndrome    Skin cancer    skin cancer face basal cell   Stroke ([place]) 2019   Past Surgical History:  Procedure Laterality Date   ATRIAL FIBRILLATION ABLATION N/A 07/23/2020   Procedure: ATRIAL FIBRILLATION ABLATION;  Surgeon: Vickie Epley, MD;  Location: [place] CV LAB;  Service: Cardiovascular;  Laterality: N/A;   ATRIAL FIBRILLATION ABLATION N/A 06/30/2021   Procedure: ATRIAL FIBRILLATION ABLATION;  Surgeon: Vickie Epley, MD;  Location: [place] CV LAB;  Service: Cardiovascular;  Laterality: N/A;   CARDIOVERSION N/A 03/25/2019   Procedure: CARDIOVERSION;  Surgeon: Yolonda Kida, MD;  Location: [facility] ORS;  Service: Cardiovascular;  Laterality: N/A;   CARDIOVERSION N/A 05/18/2020   Procedure: CARDIOVERSION;  Surgeon: Yolonda Kida, MD;  Location: [facility] ORS;  Service: Cardiovascular;  Laterality: N/A;   CARDIOVERSION N/A 01/24/2021   Procedure: CARDIOVERSION;  Surgeon: Wellington Hampshire, MD;  Location: [facility] ORS;  Service: Cardiovascular;  Laterality: N/A;   COLONOSCOPY WITH PROPOFOL N/A 09/10/2019   Procedure: COLONOSCOPY WITH PROPOFOL;  Surgeon: Robert Bellow, MD;  Location: [facility] ENDOSCOPY;  Service: Endoscopy;  Laterality: N/A;   SHOULDER ARTHROSCOPY WITH SUBACROMIAL DECOMPRESSION AND OPEN ROTATOR C Left 03/08/2020   Procedure: Left shoulder arthroscopic subscapularis repair and biodegradable balloon spacer placement with partial infraspinatus repair;  Surgeon: Leim Fabry, MD;  Location: [place];  Service: Orthopedics;  Laterality: Left;   TEE WITHOUT CARDIOVERSION N/A 07/23/2020   Procedure: TRANSESOPHAGEAL ECHOCARDIOGRAM (TEE);  Surgeon: Jerline Pain, MD;  [facility] ENDOSCOPY;  Service: Cardiovascular;  Laterality: N/A;    Current Outpatient Medications  Medication Sig Dispense Refill   acetaminophen (TYLENOL) 500 MG tablet Take 1,000 mg by mouth every 6 (six) hours as needed for mild pain or headache.     amiodarone (PACERONE) 200 MG tablet Take 200 mg by mouth daily.     apixaban (ELIQUIS) 5 MG TABS tablet Take 5 mg by mouth 2 (two) times daily.     atenolol (TENORMIN) 50 MG  tablet Take 50 mg by mouth daily in the afternoon.     atorvastatin (LIPITOR) 80 MG tablet Take 80 mg by mouth every evening.      Cholecalciferol (VITAMIN D) 50 MCG (2000 UT) CAPS Take 4,000 Units by mouth daily.     Chromium-Cinnamon (CINNAMON PLUS CHROMIUM PO) Take 1 tablet by mouth 2 (two) times daily. 500 mg / 100 mg      Cranberry 500 MG CAPS Take 500 mg by mouth daily.     diltiazem (CARDIZEM CD) 360 MG 24 hr capsule Take 1 capsule (360 mg total) by mouth daily. 90 capsule 1   docusate sodium (COLACE) 100 MG capsule Take 100 mg by mouth 2 (two) times daily.     doxazosin (CARDURA) 1 MG tablet Take 1 mg by mouth at bedtime.     furosemide (LASIX) 20 MG tablet Take 20 mg by mouth daily as needed for edema.     Glucosamine-Chondroitin (COSAMIN DS PO) Take 1 tablet by mouth 2 (two) times daily.     lisinopril (ZESTRIL) 10 MG tablet Take 10 mg by mouth daily.     magnesium oxide (MAG-OX) 400 (241.3 Mg) MG tablet Take 1 tablet (400 mg total) by mouth daily. 30 tablet 0   Multiple Vitamin (MULTIVITAMIN WITH MINERALS) TABS tablet Take 1 tablet by mouth daily.     Omega-3 Fatty Acids (FISH OIL) 1000 MG CAPS Take 1,000 mg by mouth daily. DHA     OVER THE COUNTER MEDICATION Take 1 Scoop by mouth daily. Super Beets power     rOPINIRole (REQUIP) 1 MG tablet Take 1 mg by mouth at bedtime.     tamsulosin (FLOMAX) 0.4 MG CAPS capsule Take 0.4 mg by mouth daily after supper.     TURMERIC PO Take 1,500 mg by mouth daily.     vitamin B-12 (CYANOCOBALAMIN) 1000 MCG tablet Take 1,000 mcg by mouth daily.     vitamin C (ASCORBIC ACID) 500 MG tablet Take 500 mg by mouth daily.     Vitamin E 450 MG (1000 UT) CAPS Take 1,000 Units by mouth daily.     Zinc 50 MG TABS Take 50 mg by mouth daily.     colchicine 0.6 MG tablet Take 1 tablet (0.6 mg total) by mouth 2 (two) times daily for 5 days. 10 tablet 0   No current facility-administered medications for this encounter.    No Known Allergies  Social History   Socioeconomic History   Marital status: Single    Spouse name: Not on file   Number of children: 2   Years of education: Not on file   Highest education level: Not on file  Occupational History   Not on file  Tobacco Use   Smoking status: Never   Smokeless tobacco: Never   Tobacco comments:    Never smoke 07/28/21   Vaping Use   Vaping Use: Never used  Substance and Sexual Activity   Alcohol use: Yes    Comment: rare   Drug use: Not Currently   Sexual activity: Not on file  Other Topics Concern   Not on file  Social History Narrative   Lives by himself; daughter lives about 15 miles away.    Social Determinants of Health   Financial Resource Strain: Not on file  Food Insecurity: Not on file  Transportation Needs: Not on file  Physical Activity: Not on file  Stress: Not on file  Social Connections: Not on file  Intimate Partner Violence:  Not on file    No family history on file.  ROS- All systems are reviewed and negative except as per the HPI above  Physical Exam: Vitals:   07/28/21 0909  BP: 106/70  Pulse: 65  Weight: 77.6 kg  Height: '5\' 7"'$  (1.702 m)      Wt Readings from Last 3 Encounters:  07/28/21 77.6 kg  06/30/21 78 kg  04/20/21 78.5 kg    Labs: Lab Results  Component Value Date   NA 134 04/20/2021   K 4.6 04/20/2021   CL 98 04/20/2021   CO2 18 (L) 04/20/2021   GLUCOSE 102 (H) 04/20/2021   BUN 14 04/20/2021   CREATININE 0.74 (L) 04/20/2021   CALCIUM 8.8 04/20/2021   MG 2.0 01/21/2019   Lab Results  Component Value Date   INR 1.1 01/02/2021   No results found for: CHOL, HDL, LDLCALC, TRIG  GEN- The patient is a well appearing male, alert and oriented x 3 today.   HEENT-head normocephalic, atraumatic, sclera clear, conjunctiva pink, hearing intact, trachea midline. Lungs- Clear to ausculation bilaterally, normal work of breathing Heart- Regular rate and rhythm, no rubs or gallops, 8-0/9 systolic murmur  GI- soft, NT, ND, + BS Extremities- no clubbing, cyanosis, or edema MS- no significant deformity or atrophy Skin- no rash or lesion Psych- euthymic mood, full affect Neuro- strength and sensation are intact   EKG-  SR Vent. rate 65 BPM PR interval 184 ms QRS duration 120 ms QT/QTcB 434/451 ms  CHA2DS2-VASc Score = 5  The patient's score is  based upon: CHF History: 0 HTN History: 1 Diabetes History: 1 Stroke History: 2 Vascular Disease History: 0 Age Score: 1 Gender Score: 0        ASSESSMENT AND PLAN: 1. Persistent Atrial Fibrillation/atrial flutter The patient's CHA2DS2-VASc score is 5, indicating a 7.2% annual risk of stroke.   S/p ablation 07/23/20 with repeat afib and flutter ablation 06/30/21 Patient appears to be maintaining SR. Continue amiodarone 200 mg daily. Hopefully, will be able to decrease/discontinue this medication if he stays in SR. Continue diltiazem 360 mg daily Continue Eliquis 5 mg BID Continue atenolol 50 mg daily  2. Secondary Hypercoagulable State (ICD10:  D68.69) The patient is at significant risk for stroke/thromboembolism based upon his CHA2DS2-VASc Score of 5.  Continue Apixaban (Eliquis).   3. HTN Stable, no changes today.   Follow up with Dr Quentin Ore as scheduled.    [facility] [address] [place], [place] [address] [phone]

## 2021-07-28 ENCOUNTER — Ambulatory Visit (HOSPITAL_COMMUNITY)
Admission: RE | Admit: 2021-07-28 | Discharge: 2021-07-28 | Disposition: A | Discharge status: Home / Self Care | Payer: PPO | Source: Ambulatory Visit | Attending: Physician Assistant | Admitting: Physician Assistant

## 2021-07-28 ENCOUNTER — Encounter (HOSPITAL_COMMUNITY): Payer: Self-pay | Admitting: Physician Assistant

## 2021-07-28 VITALS — BP 106/70 | HR 65 | Ht 67.0 in | Wt 171.0 lb

## 2021-07-28 DIAGNOSIS — I484 Atypical atrial flutter: Secondary | ICD-10-CM | POA: Diagnosis not present

## 2021-07-28 DIAGNOSIS — I4892 Unspecified atrial flutter: Secondary | ICD-10-CM | POA: Diagnosis not present

## 2021-07-28 DIAGNOSIS — D649 Anemia, unspecified: Secondary | ICD-10-CM | POA: Insufficient documentation

## 2021-07-28 DIAGNOSIS — I639 Cerebral infarction, unspecified: Secondary | ICD-10-CM | POA: Insufficient documentation

## 2021-07-28 DIAGNOSIS — E785 Hyperlipidemia, unspecified: Secondary | ICD-10-CM | POA: Insufficient documentation

## 2021-07-28 DIAGNOSIS — D6869 Other thrombophilia: Secondary | ICD-10-CM | POA: Diagnosis not present

## 2021-07-28 DIAGNOSIS — E119 Type 2 diabetes mellitus without complications: Secondary | ICD-10-CM | POA: Diagnosis not present

## 2021-07-28 DIAGNOSIS — I10 Essential (primary) hypertension: Secondary | ICD-10-CM | POA: Insufficient documentation

## 2021-07-28 DIAGNOSIS — R413 Other amnesia: Secondary | ICD-10-CM | POA: Diagnosis not present

## 2021-07-28 DIAGNOSIS — R001 Bradycardia, unspecified: Secondary | ICD-10-CM | POA: Insufficient documentation

## 2021-07-28 DIAGNOSIS — I4819 Other persistent atrial fibrillation: Secondary | ICD-10-CM | POA: Insufficient documentation

## 2021-07-28 DIAGNOSIS — Z7901 Long term (current) use of anticoagulants: Secondary | ICD-10-CM | POA: Diagnosis not present

## 2021-08-21 IMAGING — MR MR SHOULDER*L* W/O CM
6 series · 40 of 40 positions shown · non-contrast
Comparison: MRI left shoulder 11/21/2019.

CLINICAL DATA: Left shoulder pain and limited range of motion since
a lifting injury several months ago. History of prior left shoulder
surgery.

EXAM:
MRI OF THE LEFT SHOULDER WITHOUT CONTRAST
TECHNIQUE: Multiplanar, multisequence MR imaging of the shoulder was performed.
No intravenous contrast was administered.

[Series 4: T2 fat-sat · oblique · 4.0mm · 0.59mm/px · 7 of 20 slices shown (1 of 4)]
[im 1/20]
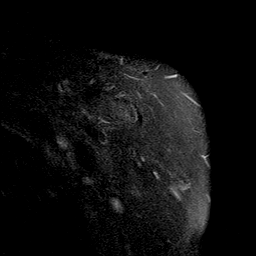
[im 4/20]
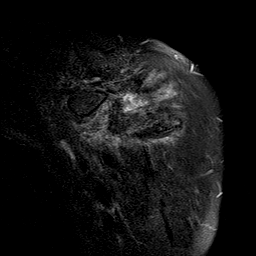
[im 7/20]
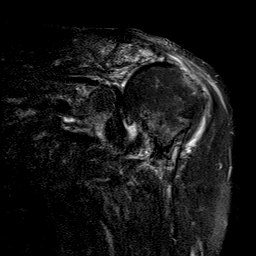
[im 10/20]
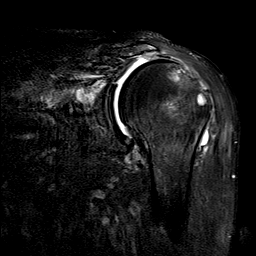
[im 13/20]
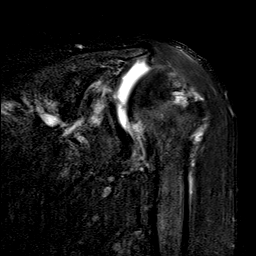
[im 16/20]
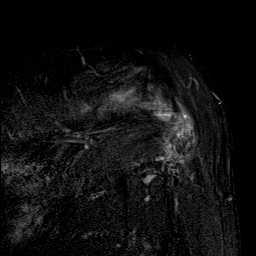
[im 20/20]
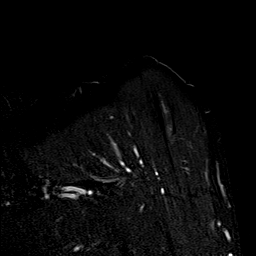

[Series 5: PD · oblique · 4.0mm · 0.59mm/px · 6 of 20 slices shown]
[im 1/20]
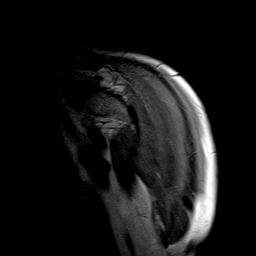
[im 4/20]
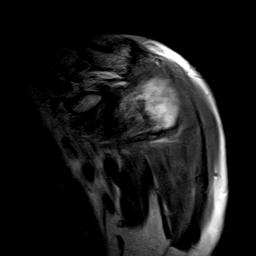
[im 8/20]
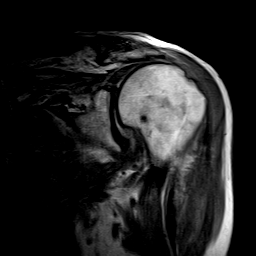
[im 12/20]
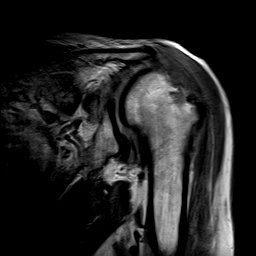
[im 16/20]
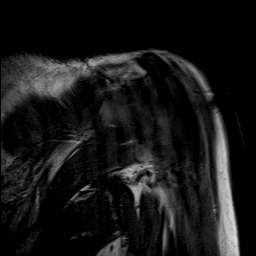
[im 20/20]
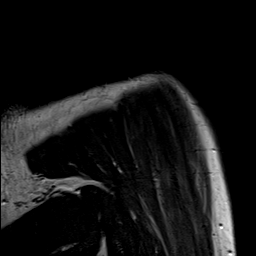

[Series 6: T1 · sagittal · 4.0mm · 0.59mm/px · 7 of 21 slices shown]
[im 1/21]
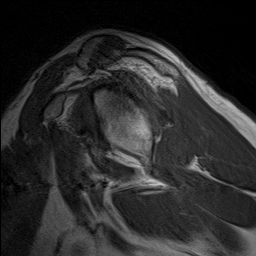
[im 4/21]
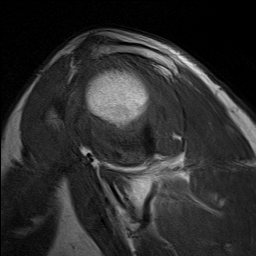
[im 7/21]
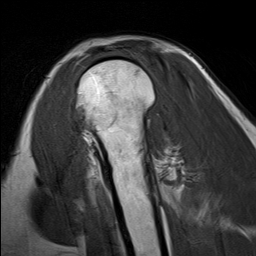
[im 11/21]
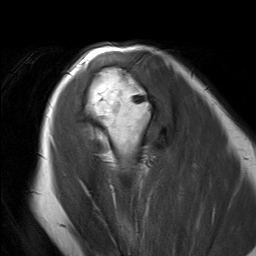
[im 14/21]
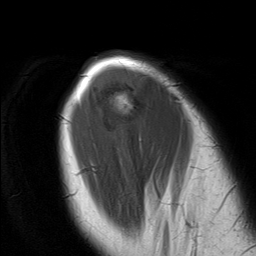
[im 17/21]
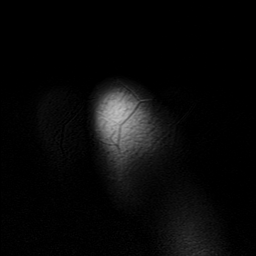
[im 21/21]
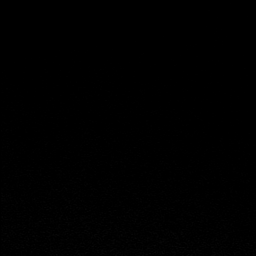

[Series 8: T2 fat-sat · axial · 4.0mm · 0.59mm/px · z∈[+3,+86]mm · 6 of 20 slices shown (2 of 4)]
[im 1/20]
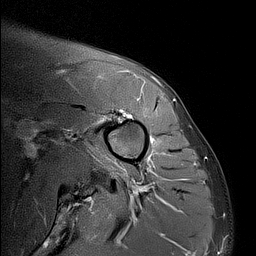
[im 4/20]
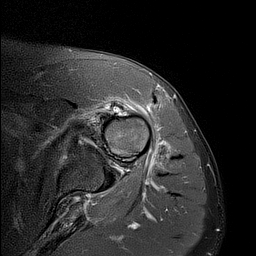
[im 8/20]
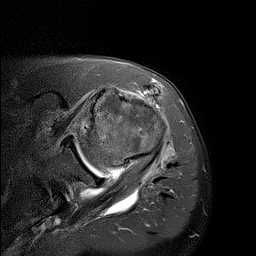
[im 12/20]
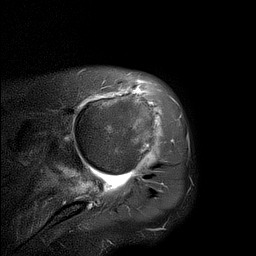
[im 16/20]
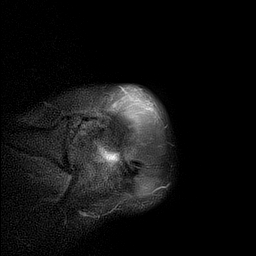
[im 20/20]
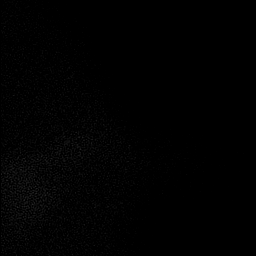

[Series 9: T2 fat-sat · sagittal · 4.0mm · 0.55mm/px · 7 of 22 slices shown (3 of 4)]
[im 1/22]
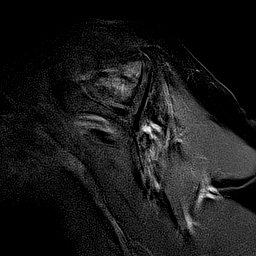
[im 4/22]
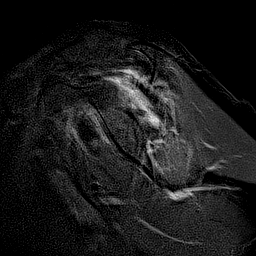
[im 8/22]
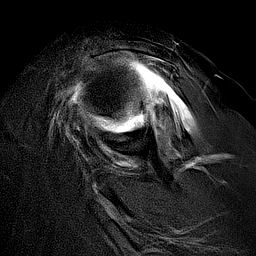
[im 11/22]
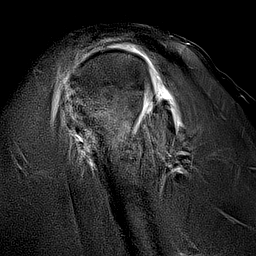
[im 15/22]
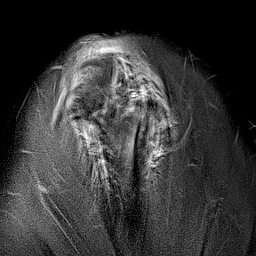
[im 18/22]
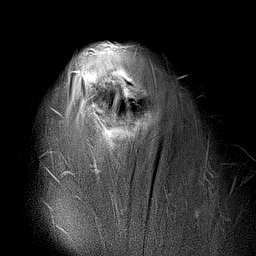
[im 22/22]
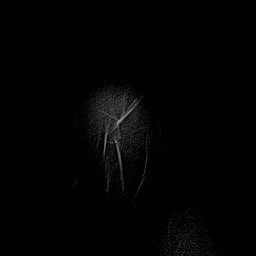

[Series 10: T2 fat-sat · oblique · 4.0mm · 0.62mm/px · 7 of 21 slices shown (4 of 4)]
[im 1/21]
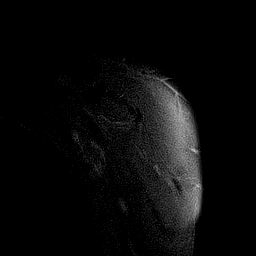
[im 4/21]
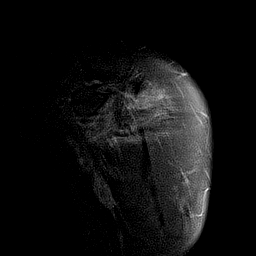
[im 7/21]
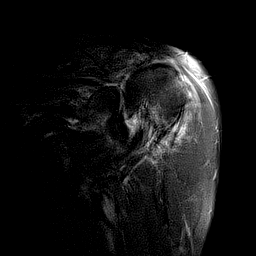
[im 11/21]
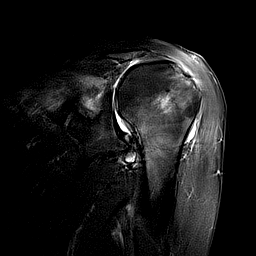
[im 14/21]
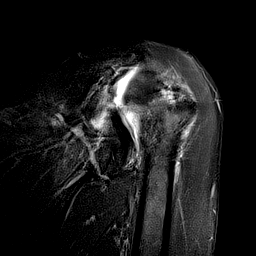
[im 17/21]
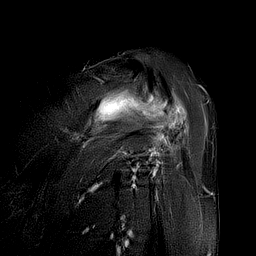
[im 21/21]
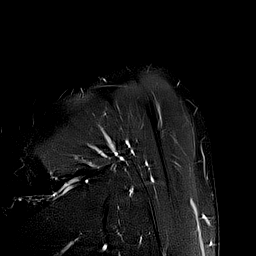

[40 of 40 positions shown; findings below may reference images not displayed]

FINDINGS: Examination is degraded by patient motion.

Rotator cuff: The patient has undergone rotator cuff repair since
the prior examination. The supraspinatus and infraspinatus are
completely torn and retracted just medial to the glenohumeral joint.
The subscapularis is almost completely torn. There may be a thin
strand of intact tendon. Retraction is at and just peripheral to the
glenohumeral joint. The teres minor appears intact but is difficult
to visualize due to extensive motion.

Muscles: Moderate fatty atrophy the supraspinatus and infraspinatus
is seen. The subscapularis appears preserved although visualization
is limited.

Biceps long head:  Completely torn as on the prior study.

Acromioclavicular Joint: Moderate osteoarthritis. Type 2 acromion.
The patient appears to be status post acromioplasty.

Glenohumeral Joint: The humeral head is high-riding due to the
patient's cuff tears.

Labrum:  The superior labrum is markedly blunted, unchanged.

Bones:  No fracture, contusion or worrisome lesion.

Other: None.
IMPRESSION: Complete supraspinatus and infraspinatus tendon tears with
retraction just medial to the glenohumeral joint. Moderate
supraspinatus and infraspinatus atrophy. The humeral head is
high-riding due to the patient's cuff tears.

Near complete tear of the subscapularis with retraction at and just
peripheral to the glenohumeral joint. Visualization is limited but
no definite subscapularis atrophy is seen.

Complete tear of the long head of biceps from the superior labrum as
on the prior MRI.

Markedly blunted superior labrum, unchanged.

Moderate acromioclavicular osteoarthritis.

## 2021-08-22 ENCOUNTER — Other Ambulatory Visit (HOSPITAL_COMMUNITY): Payer: Self-pay | Admitting: Physician Assistant

## 2021-10-05 ENCOUNTER — Encounter: Payer: Self-pay | Admitting: Cardiology

## 2021-10-05 ENCOUNTER — Ambulatory Visit: Payer: PPO | Admitting: Cardiology

## 2021-10-05 ENCOUNTER — Telehealth: Payer: Self-pay | Admitting: Cardiology

## 2021-10-05 VITALS — BP 110/60 | HR 63 | Ht 67.0 in | Wt 173.8 lb

## 2021-10-05 DIAGNOSIS — I4819 Other persistent atrial fibrillation: Secondary | ICD-10-CM | POA: Diagnosis not present

## 2021-10-05 DIAGNOSIS — I484 Atypical atrial flutter: Secondary | ICD-10-CM | POA: Diagnosis not present

## 2021-10-05 DIAGNOSIS — Z8673 Personal history of transient ischemic attack (TIA), and cerebral infarction without residual deficits: Secondary | ICD-10-CM

## 2021-10-05 MED ORDER — APIXABAN 5 MG PO TABS: 5.0000 mg | ORAL_TABLET | Freq: Two times a day (BID) | ORAL | 2 refills | Status: DC

## 2021-10-05 MED ORDER — AMIODARONE HCL 200 MG PO TABS: 100.0000 mg | ORAL_TABLET | Freq: Every day | ORAL | 11 refills | Status: DC

## 2021-10-05 NOTE — Telephone Encounter (Signed)
Patient left without avs   Per tina called patient but with no response   Patient will need labs now and fu APP in 4 months  Recall in place. And mailed to patient

## 2021-10-05 NOTE — Patient Instructions (Signed)
Medication Instructions:  Decrease Amiodarone to 100 mg daily *If you need a refill on your cardiac medications before your next appointment, please call your pharmacy*   Lab Work: CMP, Free T4, TSH Please schedule lab work in next week of so.  If you have labs (blood work) drawn today and your tests are completely normal, you will receive your results only by: [place] (if you have MyChart) OR A paper copy in the mail If you have any lab test that is abnormal or we need to change your treatment, we will call you to review the results.   Testing/Procedures: none   Follow-Up: At [facility], you and your health needs are our priority.  As part of our continuing mission to provide you with exceptional heart care, we have created designated Provider Care Teams.  These Care Teams include your primary Cardiologist (physician) and Advanced Practice Providers (APPs -  Physician Assistants and Nurse Practitioners) who all work together to provide you with the care you need, when you need it.  We recommend signing up for the patient portal called "MyChart".  Sign up information is provided on this After Visit Summary.  MyChart is used to connect with patients for Virtual Visits (Telemedicine).  Patients are able to view lab/test results, encounter notes, upcoming appointments, etc.  Non-urgent messages can be sent to your provider as well.   To learn more about what you can do with MyChart, go to [email or web address].    Your next appointment:   4 month(s)  The format for your next appointment:   In Person  Provider:   You will see one of the following Advanced Practice Providers on your designated Care Team:   Murray Hodgkins, NP Christell Faith, PA-C Cadence Kathlen Mody, [place]  Other Instructions NA  Important Information About Sugar

## 2021-10-05 NOTE — Progress Notes (Signed)
Electrophysiology Office Follow up Visit Note:    Date:  10/05/2021   ID:  Timothy Wells, DOB Dec 11, 1950, MRN [id number]  PCP:  Latanya Maudlin, NP  [facility] Cardiologist:  None  [facility] Electrophysiologist:  Vickie Epley, MD    Interval History:    Timothy Wells is a 71 y.o. male who presents for a follow up visit after redo A-fib ablation on Jun 30, 2021.  During that ablation, the right pulmonary veins were reisolated and the posterior wall was added to the lesion set.  The CTI was also ablated along with a perimitral flutter.  He still Ricky in the A-fib clinic on July 28, 2021 and was maintaining sinus rhythm.  He takes Eliquis for stroke prophylaxis and is maintained on amiodarone 200 mg by mouth once daily.  Mr. Moch is doing well.  He is active and still works 4 to 5 days/week.  No syncope.  Takes his amiodarone daily.  Also still taking his Eliquis for stroke prophylaxis.     Past Medical History:  Diagnosis Date   A-fib ([place])    Anemia    Diabetes mellitus without complication (HCC)    type 2   Dysrhythmia    wide complex tachycardia   Edema of both lower legs    Memory loss    Restless leg syndrome    Skin cancer    skin cancer face basal cell   Stroke ([place]) 2019    Past Surgical History:  Procedure Laterality Date   ATRIAL FIBRILLATION ABLATION N/A 07/23/2020   Procedure: ATRIAL FIBRILLATION ABLATION;  Surgeon: Vickie Epley, MD;  Location: [place] CV LAB;  Service: Cardiovascular;  Laterality: N/A;   ATRIAL FIBRILLATION ABLATION N/A 06/30/2021   Procedure: ATRIAL FIBRILLATION ABLATION;  Surgeon: Vickie Epley, MD;  Location: [place] CV LAB;  Service: Cardiovascular;  Laterality: N/A;   CARDIOVERSION N/A 03/25/2019   Procedure: CARDIOVERSION;  Surgeon: Yolonda Kida, MD;  Location: [facility] ORS;  Service: Cardiovascular;  Laterality: N/A;   CARDIOVERSION N/A 05/18/2020   Procedure: CARDIOVERSION;  Surgeon: Yolonda Kida, MD;  Location: [facility] ORS;  Service: Cardiovascular;  Laterality: N/A;   CARDIOVERSION N/A 01/24/2021   Procedure: CARDIOVERSION;  Surgeon: Wellington Hampshire, MD;  Location: [facility] ORS;  Service: Cardiovascular;  Laterality: N/A;   COLONOSCOPY WITH PROPOFOL N/A 09/10/2019   Procedure: COLONOSCOPY WITH PROPOFOL;  Surgeon: Robert Bellow, MD;  Location: [facility] ENDOSCOPY;  Service: Endoscopy;  Laterality: N/A;   SHOULDER ARTHROSCOPY WITH SUBACROMIAL DECOMPRESSION AND OPEN ROTATOR C Left 03/08/2020   Procedure: Left shoulder arthroscopic subscapularis repair and biodegradable balloon spacer placement with partial infraspinatus repair;  Surgeon: Leim Fabry, MD;  Location: [place];  Service: Orthopedics;  Laterality: Left;   TEE WITHOUT CARDIOVERSION N/A 07/23/2020   Procedure: TRANSESOPHAGEAL ECHOCARDIOGRAM (TEE);  Surgeon: Jerline Pain, MD;  Location: [facility] ENDOSCOPY;  Service: Cardiovascular;  Laterality: N/A;    Current Medications: No outpatient medications have been marked as taking for the 10/05/21 encounter (Appointment) with Vickie Epley, MD.     Allergies:   Patient has no known allergies.   Social History   Socioeconomic History   Marital status: Single    Spouse name: Not on file   Number of children: 2   Years of education: Not on file   Highest education level: Not on file  Occupational History   Not on file  Tobacco Use   Smoking status: Never  Smokeless tobacco: Never   Tobacco comments:    Never smoke 07/28/21  Vaping Use   Vaping Use: Never used  Substance and Sexual Activity   Alcohol use: Yes    Comment: rare   Drug use: Not Currently   Sexual activity: Not on file  Other Topics Concern   Not on file  Social History Narrative   Lives by himself; daughter lives about 15 miles away.    Social Determinants of Health   Financial Resource Strain: Not on file  Food Insecurity: Not on file  Transportation Needs: Not on file  Physical Activity:  Not on file  Stress: Not on file  Social Connections: Not on file     Family History: The patient's family history is not on file.  ROS:   Please see the history of present illness.    All other systems reviewed and are negative.  EKGs/Labs/Other Studies Reviewed:    The following studies were reviewed today:    EKG:  The ekg ordered today demonstrates sinus rhythm  Recent Labs: 01/02/2021: Hemoglobin 11.9; Platelets 220 04/20/2021: ALT 18; BUN 14; Creatinine, Ser 0.74; Potassium 4.6; Sodium 134; TSH 3.590  Recent Lipid Panel No results found for: "CHOL", "TRIG", "HDL", "CHOLHDL", "VLDL", "LDLCALC", "LDLDIRECT"  Physical Exam:    VS:  There were no vitals taken for this visit.    Wt Readings from Last 3 Encounters:  07/28/21 171 lb (77.6 kg)  06/30/21 172 lb (78 kg)  04/20/21 173 lb (78.5 kg)     GEN:  Well nourished, well developed in no acute distress HEENT: Normal NECK: No JVD; No carotid bruits LYMPHATICS: No lymphadenopathy CARDIAC: RRR, no murmurs, rubs, gallops RESPIRATORY:  Clear to auscultation without rales, wheezing or rhonchi  ABDOMEN: Soft, non-tender, non-distended MUSCULOSKELETAL:  No edema; No deformity  SKIN: Warm and dry NEUROLOGIC:  Alert and oriented x 3 PSYCHIATRIC:  Normal affect        ASSESSMENT:    1. Persistent atrial fibrillation ([place])   2. Atypical atrial flutter (HCC)   3. History of CVA (cerebrovascular accident)    PLAN:    In order of problems listed above:  #Persistent atrial fibrillation and flutter The patient is post ablation in May 2022 and May 2023 with ablation of the pulmonary veins, posterior wall, Perry mitral flutter and typical atrial flutter. Continue Eliquis for stroke prophylaxis. Decrease amiodarone to 100 mg by mouth once daily  #High risk med monitoring Check CMP, TSH, free T4.    Follow-up in 4 months with APP.    Medication Adjustments/Labs and Tests Ordered: Current medicines are reviewed  at length with the patient today.  Concerns regarding medicines are outlined above.  No orders of the defined types were placed in this encounter.  No orders of the defined types were placed in this encounter.    Signed, Lars Mage, MD, [facility], [facility] 10/05/2021 6:05 AM    Electrophysiology [facility]

## 2021-12-12 NOTE — Progress Notes (Addendum)
Anesthesia Review:  PCP: Larene Beach coward- LOV 06/07/21  Cardiologist : DR Windell Hummingbird LOV 12/08/21  Clearance 11/11/21 on chart  DR Quentin Ore- EP- LOV 10/05/21  Chest x-ray : 01/02/21- 2 view  EKG : 10/05/21  Afib ablation - 06/30/21  Ct Card- 06/27/21  Echo : 07/23/20  Stress test: Cardiac Cath :  Activity level: can do a flgiht of stairs without difficutly  Sleep Study/ CPAP : none  Fasting Blood Sugar :      / Checks Blood Sugar -- times a day:   Blood Thinner/ Instructions /Last Dose: ASA / Instructions/ Last Dose :   Eliquis - stop 5 days prior per pt  PT states is not diabetic at preop appt.  On no meds.  Hgbaqc-12/16/21-6.0   PT called on 11/3021 and LVMM on charge nurse phone asking for a call back.   Preop nurse was notified on 10//31/23 .  Called pt at [phone]  to call him back.  PT stated he had a question in regards to prep instructiond.  Pt stated on preop instructons he had no instructions in regards to Eliquis on am of surgery. Informed pt that on preop instructions preop nurse instructions are what to take am of surgery.  Informed pt that preop nurse documented at time of preop that he stated he was to stop Eliquis 5 days prior to surgery .  Pt states that he was not given any instructions regarding Eliquis.  Instructed pt to call Claiborne Billings at office at [phone] as soon as he hangs up the phone and ask her regarding Eliquis instructions.  PT states he has not stopped taking Eliquis.  Also asked preop nurse about Home PT after surgery. Informed pt that he needed to speak with Claiborne Billings in regards to this.  PT voiced understanding and stated he would call Hulen Skains as soon as our phone call is completed.  Shawn Stall made aware of above on 12/27/21.

## 2021-12-13 NOTE — Patient Instructions (Addendum)
SURGICAL WAITING ROOM VISITATION Patients having surgery or a procedure may have no more than 2 support people in the waiting area - these visitors may rotate.   Children under the age of 74 must have an adult with them who is not the patient. If the patient needs to stay at the hospital during part of their recovery, the visitor guidelines for inpatient rooms apply. Pre-op nurse will coordinate an appropriate time for 1 support person to accompany patient in pre-op.  This support person may not rotate.    Please refer to the [facility] website for the visitor guidelines for Inpatients (after your surgery is over and you are in a regular room).       Your procedure is scheduled on:  12/29/2021    Report to [facility] Main Entrance    Report to admitting at  1015 AM   Call this number if you have problems the morning of surgery [phone]   Do not eat food :After Midnight.   After Midnight you may have the following liquids until __ 0945____ AM  DAY OF SURGERY  Water Non-Citrus Juices (without pulp, NO RED) Carbonated Beverages Black Coffee (NO MILK/CREAM OR CREAMERS, sugar ok)  Clear Tea (NO MILK/CREAM OR CREAMERS, sugar ok) regular and decaf                             Plain Jell-O (NO RED)                                           Fruit ices (not with fruit pulp, NO RED)                                     Popsicles (NO RED)                                                               Sports drinks like Gatorade (NO RED)                     The day of surgery:  Drink ONE (1) Pre-Surgery Clear Ensure or G2 at   0945AM  ( have completed by ) the morning of surgery. Drink in one sitting. Do not sip.  This drink was given to you during your hospital  pre-op appointment visit. Nothing else to drink after completing the  Pre-Surgery Clear Ensure or G2.          If you have questions, please contact your surgeon's office.        Oral Hygiene is also important  to reduce your risk of infection.                                    Remember - BRUSH YOUR TEETH THE MORNING OF SURGERY WITH YOUR REGULAR TOOTHPASTE   Do NOT smoke after Midnight   Take these medicines the morning of surgery with A SIP OF WATER:  Amiodarone, cardizem  DO NOT TAKE ANY ORAL DIABETIC MEDICATIONS DAY OF YOUR SURGERY  Bring CPAP mask and tubing day of surgery.                              You may not have any metal on your body including hair pins, jewelry, and body piercing             Do not wear make-up, lotions, powders, perfumes/cologne, or deodorant  Do not wear nail polish including gel and S&S, artificial/acrylic nails, or any other type of covering on natural nails including finger and toenails. If you have artificial nails, gel coating, etc. that needs to be removed by a nail salon please have this removed prior to surgery or surgery may need to be canceled/ delayed if the surgeon/ anesthesia feels like they are unable to be safely monitored.   Do not shave  48 hours prior to surgery.               Men may shave face and neck.   Do not bring valuables to the hospital. [place].   Contacts, dentures or bridgework may not be worn into surgery.   Bring small overnight bag day of surgery.   DO NOT [place]. PHARMACY WILL DISPENSE MEDICATIONS LISTED ON YOUR MEDICATION LIST TO YOU DURING YOUR ADMISSION [place]!    Patients discharged on the day of surgery will not be allowed to drive home.  Someone NEEDS to stay with you for the first 24 hours after anesthesia.   Special Instructions: Bring a copy of your healthcare power of attorney and living will documents the day of surgery if you haven't scanned them before.              Please read over the following fact sheets you were given: IF [place] [phone]   If you  received a COVID test during your pre-op visit  it is requested that you wear a mask when out in public, stay away from anyone that may not be feeling well and notify your surgeon if you develop symptoms. If you test positive for Covid or have been in contact with anyone that has tested positive in the last 10 days please notify you surgeon.     [place] - Preparing for Surgery Before surgery, you can play an important role.  Because skin is not sterile, your skin needs to be as free of germs as possible.  You can reduce the number of germs on your skin by washing with CHG (chlorahexidine gluconate) soap before surgery.  CHG is an antiseptic cleaner which kills germs and bonds with the skin to continue killing germs even after washing. Please DO NOT use if you have an allergy to CHG or antibacterial soaps.  If your skin becomes reddened/irritated stop using the CHG and inform your nurse when you arrive at Short Stay. Do not shave (including legs and underarms) for at least 48 hours prior to the first CHG shower.  You may shave your face/neck. Please follow these instructions carefully:  1.  Shower with CHG Soap the night before surgery and the  morning of Surgery.  2.  If you choose to wash your hair, wash your hair first as usual  with your  normal  shampoo.  3.  After you shampoo, rinse your hair and body thoroughly to remove the  shampoo.                           4.  Use CHG as you would any other liquid soap.  You can apply chg directly  to the skin and wash                       Gently with a scrungie or clean washcloth.  5.  Apply the CHG Soap to your body ONLY FROM THE NECK DOWN.   Do not use on face/ open                           Wound or open sores. Avoid contact with eyes, ears mouth and genitals (private parts).                       Wash face,  Genitals (private parts) with your normal soap.             6.  Wash thoroughly, paying special attention to the area where your surgery  will  be performed.  7.  Thoroughly rinse your body with warm water from the neck down.  8.  DO NOT shower/wash with your normal soap after using and rinsing off  the CHG Soap.                9.  Pat yourself dry with a clean towel.            10.  Wear clean pajamas.            11.  Place clean sheets on your bed the night of your first shower and do not  sleep with pets. Day of Surgery : Do not apply any lotions/deodorants the morning of surgery.  Please wear clean clothes to the hospital/surgery center.  FAILURE TO FOLLOW THESE INSTRUCTIONS MAY RESULT IN THE CANCELLATION OF YOUR SURGERY PATIENT SIGNATURE_________________________________  NURSE SIGNATURE__________________________________  ________________________________________________________________________ [facility]- Preparing for Total Shoulder Arthroplasty    Before surgery, you can play an important role. Because skin is not sterile, your skin needs to be as free of germs as possible. You can reduce the number of germs on your skin by using the following products. Benzoyl Peroxide Gel Reduces the number of germs present on the skin Applied twice a day to shoulder area starting two days before surgery    ==================================================================  Please follow these instructions carefully:  BENZOYL PEROXIDE 5% GEL  Please do not use if you have an allergy to benzoyl peroxide.   If your skin becomes reddened/irritated stop using the benzoyl peroxide.  Starting two days before surgery, apply as follows: Apply benzoyl peroxide in the morning and at night. Apply after taking a shower. If you are not taking a shower clean entire shoulder front, back, and side along with the armpit with a clean wet washcloth.  Place a quarter-sized dollop on your shoulder and rub in thoroughly, making sure to cover the front, back, and side of your shoulder, along with the armpit.   2 days before ____ AM   ____ PM               1 day before ____ AM   ____ PM  Do this twice a day for two days.  (Last application is the night before surgery, AFTER using the CHG soap as described below).  Do NOT apply benzoyl peroxide gel on the day of surgery.

## 2021-12-16 ENCOUNTER — Encounter (HOSPITAL_COMMUNITY)
Admission: RE | Admit: 2021-12-16 | Discharge: 2021-12-16 | Disposition: A | Discharge status: Home / Self Care | Payer: PPO | Source: Ambulatory Visit | Attending: Orthopedic Surgery | Admitting: Orthopedic Surgery

## 2021-12-16 ENCOUNTER — Other Ambulatory Visit: Payer: Self-pay

## 2021-12-16 ENCOUNTER — Encounter (HOSPITAL_COMMUNITY): Payer: Self-pay

## 2021-12-16 VITALS — BP 114/67 | HR 66 | Temp 97.9°F | Resp 16 | Ht 67.0 in | Wt 167.0 lb

## 2021-12-16 DIAGNOSIS — I10 Essential (primary) hypertension: Secondary | ICD-10-CM | POA: Insufficient documentation

## 2021-12-16 DIAGNOSIS — I484 Atypical atrial flutter: Secondary | ICD-10-CM | POA: Diagnosis not present

## 2021-12-16 DIAGNOSIS — Z01812 Encounter for preprocedural laboratory examination: Secondary | ICD-10-CM | POA: Diagnosis present

## 2021-12-16 DIAGNOSIS — R739 Hyperglycemia, unspecified: Secondary | ICD-10-CM | POA: Diagnosis not present

## 2021-12-16 DIAGNOSIS — I4819 Other persistent atrial fibrillation: Secondary | ICD-10-CM | POA: Diagnosis not present

## 2021-12-16 DIAGNOSIS — Z01818 Encounter for other preprocedural examination: Secondary | ICD-10-CM

## 2021-12-16 HISTORY — DX: Gastro-esophageal reflux disease without esophagitis: K21.9

## 2021-12-16 LAB — BASIC METABOLIC PANEL
Anion gap: 6 (ref 5–15)
BUN: 19 mg/dL (ref 8–23)
CO2: 27 mmol/L (ref 22–32)
Calcium: 8.5 mg/dL — ABNORMAL LOW (ref 8.9–10.3)
Chloride: 99 mmol/L (ref 98–111)
Creatinine, Ser: 0.8 mg/dL (ref 0.61–1.24)
GFR, Estimated: >60 mL/min (ref >60)
Glucose, Bld: 100 mg/dL — ABNORMAL HIGH (ref 70–99)
Potassium: 4 mmol/L (ref 3.5–5.1)
Sodium: 132 mmol/L — ABNORMAL LOW (ref 135–145)

## 2021-12-16 LAB — CBC
HCT: 32.6 % — ABNORMAL LOW (ref 39.0–52.0)
Hemoglobin: 11 g/dL — ABNORMAL LOW (ref 13.0–17.0)
MCH: 31.5 pg (ref 26.0–34.0)
MCHC: 33.7 g/dL (ref 30.0–36.0)
MCV: 93.4 fL (ref 80.0–100.0)
Platelets: 266 10*3/uL (ref 150–400)
RBC: 3.49 MIL/uL — ABNORMAL LOW (ref 4.22–5.81)
RDW: 14.3 % (ref 11.5–15.5)
WBC: 7.5 10*3/uL (ref 4.0–10.5)
nRBC: 0 % (ref 0.0–0.2)

## 2021-12-16 LAB — SURGICAL PCR SCREEN
MRSA, PCR: NEGATIVE
Staphylococcus aureus: NEGATIVE

## 2021-12-16 LAB — HEMOGLOBIN A1C
Hgb A1c MFr Bld: 6 % — ABNORMAL HIGH (ref 4.8–5.6)
Mean Plasma Glucose: 125.5 mg/dL

## 2021-12-20 NOTE — Progress Notes (Signed)
Anesthesia Chart Review   Case: [id number] Date/Time: 12/29/21 1233   Procedure: REVERSE SHOULDER ARTHROPLASTY (Left: Shoulder) - 153mn   Anesthesia type: General   Pre-op diagnosis: Left shoulder rotator cuff tear arthropathy   [facility]   Surgeons: SJustice Britain MD       DISCUSSION:71 y.o. never smoker with h/o a-fib s/p ablation 06/30/2021, stroke, DM II, left shoulder rotator cuff arthropathy scheduled for above procedure 12/29/2021 with Dr. KJustice Britain   Pt last seen by EP 10/05/2021.   Pt last seen by cardiology 12/08/2021.   Clearance on chart which states pt can hold Eliquis 5 days prior to procedure.   Anticipate pt can proceed with planned procedure barring acute status change.   VS: BP 114/67   Pulse 66   Temp 36.6 C (Oral)   Resp 16   Ht '5\' 7"'$  (1.702 m)   Wt 75.8 kg   SpO2 98%   BMI 26.16 kg/m   PROVIDERS: CLatanya Maudlin NP is PCP   LLars Mage MD is Cardiologist  LABS: Labs reviewed: Acceptable for surgery. (all labs ordered are listed, but only abnormal results are displayed)  Labs Reviewed  HEMOGLOBIN A1C - Abnormal; Notable for the following components:      Result Value   Hgb A1c MFr Bld 6.0 (*)    All other components within normal limits  BASIC METABOLIC PANEL - Abnormal; Notable for the following components:   Sodium 132 (*)    Glucose, Bld 100 (*)    Calcium 8.5 (*)    All other components within normal limits  CBC - Abnormal; Notable for the following components:   RBC 3.49 (*)    Hemoglobin 11.0 (*)    HCT 32.6 (*)    All other components within normal limits  SURGICAL PCR SCREEN     IMAGES:   EKG:   CV: Echo 07/23/2020 1. Left ventricular ejection fraction, by estimation, is 60 to 65%. The  left ventricle has normal function. The left ventricle has no regional  wall motion abnormalities.   2. Right ventricular systolic function is normal. The right ventricular  size is normal.   3. No left  atrial/left atrial appendage thrombus was detected.   4. The mitral valve is normal in structure. Mild mitral valve  regurgitation. No evidence of mitral stenosis.   5. The aortic valve is normal in structure. Aortic valve regurgitation is  not visualized. No aortic stenosis is present.   6. The inferior vena cava is normal in size with greater than 50%  respiratory variability, suggesting right atrial pressure of 3 mmHg. Past Medical History:  Diagnosis Date   A-fib ([facility]    Dysrhythmia    wide complex tachycardia   Edema of both lower legs    GERD (gastroesophageal reflux disease)    Memory loss    Restless leg syndrome    Skin cancer    skin cancer face basal cell   [place]    Past Surgical History:  Procedure Laterality Date   ATRIAL FIBRILLATION ABLATION N/A 07/23/2020   Procedure: ATRIAL FIBRILLATION ABLATION;  Surgeon: LVickie Epley MD;  [place];  Service: Cardiovascular;  Laterality: N/A;   ATRIAL FIBRILLATION ABLATION N/A 06/30/2021   Procedure: ATRIAL FIBRILLATION ABLATION;  Surgeon: LVickie Epley MD;  [place];  Service: Cardiovascular;  Laterality: N/A;   CARDIOVERSION N/A 03/25/2019   Procedure: CARDIOVERSION;  Surgeon: CLujean Amel  D, MD;  Location: [facility] ORS;  Service: Cardiovascular;  Laterality: N/A;   CARDIOVERSION N/A 05/18/2020   Procedure: CARDIOVERSION;  Surgeon: Yolonda Kida, MD;  Location: [facility] ORS;  Service: Cardiovascular;  Laterality: N/A;   CARDIOVERSION N/A 01/24/2021   Procedure: CARDIOVERSION;  Surgeon: Wellington Hampshire, MD;  Location: [facility] ORS;  Service: Cardiovascular;  Laterality: N/A;   COLONOSCOPY WITH PROPOFOL N/A 09/10/2019   Procedure: COLONOSCOPY WITH PROPOFOL;  Surgeon: Robert Bellow, MD;  Location: [facility] ENDOSCOPY;  Service: Endoscopy;  Laterality: N/A;   SHOULDER ARTHROSCOPY WITH SUBACROMIAL DECOMPRESSION AND OPEN ROTATOR C Left 03/08/2020   Procedure: Left shoulder  arthroscopic subscapularis repair and biodegradable balloon spacer placement with partial infraspinatus repair;  Surgeon: Leim Fabry, MD;  Location: [place];  Service: Orthopedics;  Laterality: Left;   TEE WITHOUT CARDIOVERSION N/A 07/23/2020   Procedure: TRANSESOPHAGEAL ECHOCARDIOGRAM (TEE);  Surgeon: Jerline Pain, MD;  Location: [facility] ENDOSCOPY;  Service: Cardiovascular;  Laterality: N/A;    MEDICATIONS:  acetaminophen (TYLENOL) 500 MG tablet   amiodarone (PACERONE) 200 MG tablet   apixaban (ELIQUIS) 5 MG TABS tablet   atenolol (TENORMIN) 50 MG tablet   atorvastatin (LIPITOR) 80 MG tablet   Cholecalciferol (VITAMIN D) 50 MCG (2000 UT) CAPS   Chromium-Cinnamon (CINNAMON PLUS CHROMIUM PO)   Cranberry 500 MG CAPS   diltiazem (CARDIZEM CD) 360 MG 24 hr capsule   docusate sodium (COLACE) 100 MG capsule   doxazosin (CARDURA) 1 MG tablet   furosemide (LASIX) 20 MG tablet   Glucosamine-Chondroitin (COSAMIN DS PO)   lisinopril (ZESTRIL) 10 MG tablet   magnesium oxide (MAG-OX) 400 (241.3 Mg) MG tablet   Multiple Vitamin (MULTIVITAMIN WITH MINERALS) TABS tablet   Omega-3 Fatty Acids (FISH OIL) 1000 MG CAPS   OVER THE COUNTER MEDICATION   rOPINIRole (REQUIP) 1 MG tablet   tamsulosin (FLOMAX) 0.4 MG CAPS capsule   TURMERIC PO   vitamin B-12 (CYANOCOBALAMIN) 1000 MCG tablet   vitamin C (ASCORBIC ACID) 500 MG tablet   Vitamin E 450 MG (1000 UT) CAPS   Zinc 50 MG TABS   No current facility-administered medications for this encounter.    Konrad Felix Ward, PA-C [facility] Pre-Surgical Testing [phone]

## 2021-12-28 ENCOUNTER — Encounter (HOSPITAL_COMMUNITY): Payer: Self-pay | Admitting: Orthopedic Surgery

## 2021-12-29 ENCOUNTER — Encounter (HOSPITAL_COMMUNITY): Payer: Self-pay | Admitting: Orthopedic Surgery

## 2021-12-29 ENCOUNTER — Ambulatory Visit (HOSPITAL_BASED_OUTPATIENT_CLINIC_OR_DEPARTMENT_OTHER): Payer: PPO | Admitting: Anesthesiology

## 2021-12-29 ENCOUNTER — Ambulatory Visit (HOSPITAL_COMMUNITY)
Admission: RE | Admit: 2021-12-29 | Discharge: 2021-12-29 | Disposition: A | Discharge status: Home / Self Care | Payer: PPO | Source: Ambulatory Visit | Attending: Orthopedic Surgery | Admitting: Orthopedic Surgery

## 2021-12-29 ENCOUNTER — Other Ambulatory Visit: Payer: Self-pay

## 2021-12-29 ENCOUNTER — Ambulatory Visit (HOSPITAL_COMMUNITY): Payer: PPO | Admitting: Physician Assistant

## 2021-12-29 ENCOUNTER — Encounter (HOSPITAL_COMMUNITY)
Admission: RE | Disposition: A | Discharge status: Home / Self Care | Payer: Self-pay | Source: Ambulatory Visit | Attending: Orthopedic Surgery

## 2021-12-29 DIAGNOSIS — R0602 Shortness of breath: Secondary | ICD-10-CM | POA: Diagnosis not present

## 2021-12-29 DIAGNOSIS — Z7901 Long term (current) use of anticoagulants: Secondary | ICD-10-CM | POA: Diagnosis not present

## 2021-12-29 DIAGNOSIS — I10 Essential (primary) hypertension: Secondary | ICD-10-CM | POA: Diagnosis not present

## 2021-12-29 DIAGNOSIS — I4891 Unspecified atrial fibrillation: Secondary | ICD-10-CM | POA: Diagnosis not present

## 2021-12-29 DIAGNOSIS — I4819 Other persistent atrial fibrillation: Secondary | ICD-10-CM

## 2021-12-29 DIAGNOSIS — D649 Anemia, unspecified: Secondary | ICD-10-CM | POA: Diagnosis not present

## 2021-12-29 DIAGNOSIS — M12812 Other specific arthropathies, not elsewhere classified, left shoulder: Secondary | ICD-10-CM | POA: Diagnosis not present

## 2021-12-29 DIAGNOSIS — M75102 Unspecified rotator cuff tear or rupture of left shoulder, not specified as traumatic: Secondary | ICD-10-CM | POA: Insufficient documentation

## 2021-12-29 DIAGNOSIS — Z8673 Personal history of transient ischemic attack (TIA), and cerebral infarction without residual deficits: Secondary | ICD-10-CM

## 2021-12-29 DIAGNOSIS — G8929 Other chronic pain: Secondary | ICD-10-CM | POA: Insufficient documentation

## 2021-12-29 DIAGNOSIS — M19012 Primary osteoarthritis, left shoulder: Secondary | ICD-10-CM | POA: Insufficient documentation

## 2021-12-29 DIAGNOSIS — Z01818 Encounter for other preprocedural examination: Secondary | ICD-10-CM

## 2021-12-29 DIAGNOSIS — I959 Hypotension, unspecified: Secondary | ICD-10-CM

## 2021-12-29 DIAGNOSIS — K219 Gastro-esophageal reflux disease without esophagitis: Secondary | ICD-10-CM | POA: Insufficient documentation

## 2021-12-29 HISTORY — PX: REVERSE SHOULDER ARTHROPLASTY: SHX5054

## 2021-12-29 LAB — TYPE AND SCREEN
ABO/RH(D): O POS
Antibody Screen: NEGATIVE

## 2021-12-29 LAB — ABO/RH: ABO/RH(D): O POS

## 2021-12-29 SURGERY — ARTHROPLASTY, SHOULDER, TOTAL, REVERSE
Anesthesia: Regional | Site: Shoulder | Laterality: Left

## 2021-12-29 MED ORDER — LACTATED RINGERS IV BOLUS
500.0000 mL | Freq: Once | INTRAVENOUS | Status: AC
  Administered 2021-12-29: 500 mL via INTRAVENOUS

## 2021-12-29 MED ORDER — CHLORHEXIDINE GLUCONATE 0.12 % MT SOLN
15.0000 mL | Freq: Once | OROMUCOSAL | Status: AC
  Administered 2021-12-29: 15 mL via OROMUCOSAL

## 2021-12-29 MED ORDER — FENTANYL CITRATE (PF) 100 MCG/2ML IJ SOLN
INTRAMUSCULAR | Status: DC | PRN
  Administered 2021-12-29: 50 ug via INTRAVENOUS

## 2021-12-29 MED ORDER — CEFAZOLIN SODIUM-DEXTROSE 2-4 GM/100ML-% IV SOLN
2.0000 g | INTRAVENOUS | Status: AC
  Administered 2021-12-29: 2 g via INTRAVENOUS
  Filled 2021-12-29: qty 100

## 2021-12-29 MED ORDER — EPHEDRINE 5 MG/ML INJ
INTRAVENOUS | Status: AC
  Filled 2021-12-29: qty 5

## 2021-12-29 MED ORDER — LACTATED RINGERS IV BOLUS
250.0000 mL | Freq: Once | INTRAVENOUS | Status: AC
  Administered 2021-12-29: 250 mL via INTRAVENOUS

## 2021-12-29 MED ORDER — AMISULPRIDE (ANTIEMETIC) 5 MG/2ML IV SOLN: 10.0000 mg | Freq: Once | INTRAVENOUS | Status: DC | PRN

## 2021-12-29 MED ORDER — DEXAMETHASONE SODIUM PHOSPHATE 10 MG/ML IJ SOLN
INTRAMUSCULAR | Status: DC | PRN
  Administered 2021-12-29: 8 mg via INTRAVENOUS

## 2021-12-29 MED ORDER — TRANEXAMIC ACID-NACL 1000-0.7 MG/100ML-% IV SOLN
1000.0000 mg | INTRAVENOUS | Status: AC
  Administered 2021-12-29: 1000 mg via INTRAVENOUS
  Filled 2021-12-29: qty 100

## 2021-12-29 MED ORDER — VANCOMYCIN HCL 1000 MG IV SOLR
INTRAVENOUS | Status: AC
  Filled 2021-12-29: qty 20

## 2021-12-29 MED ORDER — LIDOCAINE HCL (PF) 2 % IJ SOLN
INTRAMUSCULAR | Status: AC
  Filled 2021-12-29: qty 5

## 2021-12-29 MED ORDER — 0.9 % SODIUM CHLORIDE (POUR BTL) OPTIME
TOPICAL | Status: DC | PRN
  Administered 2021-12-29: 1000 mL

## 2021-12-29 MED ORDER — PHENYLEPHRINE HCL-NACL 20-0.9 MG/250ML-% IV SOLN
INTRAVENOUS | Status: DC | PRN
  Administered 2021-12-29: 45 ug/min via INTRAVENOUS

## 2021-12-29 MED ORDER — PROPOFOL 10 MG/ML IV BOLUS
INTRAVENOUS | Status: DC | PRN
  Administered 2021-12-29: 100 mg via INTRAVENOUS

## 2021-12-29 MED ORDER — STERILE WATER FOR IRRIGATION IR SOLN
Status: DC | PRN
  Administered 2021-12-29: 2000 mL

## 2021-12-29 MED ORDER — MIDAZOLAM HCL 2 MG/2ML IJ SOLN
1.0000 mg | INTRAMUSCULAR | Status: DC
  Filled 2021-12-29: qty 2

## 2021-12-29 MED ORDER — VANCOMYCIN HCL 1000 MG IV SOLR
INTRAVENOUS | Status: DC | PRN
  Administered 2021-12-29: 1000 mg

## 2021-12-29 MED ORDER — ACETAMINOPHEN 500 MG PO TABS
1000.0000 mg | ORAL_TABLET | Freq: Once | ORAL | Status: AC
  Administered 2021-12-29: 1000 mg via ORAL
  Filled 2021-12-29: qty 2

## 2021-12-29 MED ORDER — ONDANSETRON HCL 4 MG PO TABS: 4.0000 mg | ORAL_TABLET | Freq: Three times a day (TID) | ORAL | 0 refills | Status: AC | PRN

## 2021-12-29 MED ORDER — LACTATED RINGERS IV SOLN: INTRAVENOUS | Status: DC

## 2021-12-29 MED ORDER — ROCURONIUM BROMIDE 10 MG/ML (PF) SYRINGE
PREFILLED_SYRINGE | INTRAVENOUS | Status: AC
  Filled 2021-12-29: qty 10

## 2021-12-29 MED ORDER — EPHEDRINE SULFATE-NACL 50-0.9 MG/10ML-% IV SOSY
PREFILLED_SYRINGE | INTRAVENOUS | Status: DC | PRN
  Administered 2021-12-29 (×3): 5 mg via INTRAVENOUS
  Administered 2021-12-29: 10 mg via INTRAVENOUS
  Administered 2021-12-29 (×2): 5 mg via INTRAVENOUS
  Administered 2021-12-29 (×2): 10 mg via INTRAVENOUS

## 2021-12-29 MED ORDER — ORAL CARE MOUTH RINSE: 15.0000 mL | Freq: Once | OROMUCOSAL | Status: AC

## 2021-12-29 MED ORDER — OXYCODONE-ACETAMINOPHEN 5-325 MG PO TABS: 1.0000 | ORAL_TABLET | ORAL | 0 refills | Status: DC | PRN

## 2021-12-29 MED ORDER — LIDOCAINE 2% (20 MG/ML) 5 ML SYRINGE
INTRAMUSCULAR | Status: DC | PRN
  Administered 2021-12-29: 80 mg via INTRAVENOUS

## 2021-12-29 MED ORDER — TRANEXAMIC ACID 1000 MG/10ML IV SOLN: 1000.0000 mg | INTRAVENOUS | Status: DC

## 2021-12-29 MED ORDER — ONDANSETRON HCL 4 MG/2ML IJ SOLN: 4.0000 mg | Freq: Once | INTRAMUSCULAR | Status: DC | PRN

## 2021-12-29 MED ORDER — ROCURONIUM BROMIDE 10 MG/ML (PF) SYRINGE
PREFILLED_SYRINGE | INTRAVENOUS | Status: DC | PRN
  Administered 2021-12-29: 10 mg via INTRAVENOUS
  Administered 2021-12-29: 20 mg via INTRAVENOUS
  Administered 2021-12-29: 10 mg via INTRAVENOUS
  Administered 2021-12-29: 50 mg via INTRAVENOUS

## 2021-12-29 MED ORDER — FENTANYL CITRATE PF 50 MCG/ML IJ SOSY
50.0000 ug | PREFILLED_SYRINGE | INTRAMUSCULAR | Status: DC
  Administered 2021-12-29: 50 ug via INTRAVENOUS
  Filled 2021-12-29: qty 2

## 2021-12-29 MED ORDER — CYCLOBENZAPRINE HCL 10 MG PO TABS: 10.0000 mg | ORAL_TABLET | Freq: Three times a day (TID) | ORAL | 1 refills | Status: AC | PRN

## 2021-12-29 MED ORDER — BUPIVACAINE LIPOSOME 1.3 % IJ SUSP
INTRAMUSCULAR | Status: DC | PRN
  Administered 2021-12-29: 10 mL via PERINEURAL

## 2021-12-29 MED ORDER — BUPIVACAINE HCL (PF) 0.5 % IJ SOLN
INTRAMUSCULAR | Status: DC | PRN
  Administered 2021-12-29: 15 mL via PERINEURAL

## 2021-12-29 MED ORDER — SUGAMMADEX SODIUM 200 MG/2ML IV SOLN
INTRAVENOUS | Status: DC | PRN
  Administered 2021-12-29 (×2): 200 mg via INTRAVENOUS

## 2021-12-29 MED ORDER — FENTANYL CITRATE PF 50 MCG/ML IJ SOSY: 25.0000 ug | PREFILLED_SYRINGE | INTRAMUSCULAR | Status: DC | PRN

## 2021-12-29 MED ORDER — ONDANSETRON HCL 4 MG/2ML IJ SOLN
INTRAMUSCULAR | Status: DC | PRN
  Administered 2021-12-29: 4 mg via INTRAVENOUS

## 2021-12-29 MED ORDER — FENTANYL CITRATE (PF) 100 MCG/2ML IJ SOLN
INTRAMUSCULAR | Status: AC
  Filled 2021-12-29: qty 2

## 2021-12-29 SURGICAL SUPPLY — 73 items
BAG COUNTER SPONGE SURGICOUNT (BAG) IMPLANT
BAG ZIPLOCK 12X15 (MISCELLANEOUS) ×1 IMPLANT
BASEPLATE AUG FULL 24X10X2 LAT (Plate) IMPLANT
BLADE SAW SGTL 83.5X18.5 (BLADE) ×1 IMPLANT
BNDG COHESIVE 4X5 TAN STRL LF (GAUZE/BANDAGES/DRESSINGS) ×1 IMPLANT
COOLER ICEMAN CLASSIC (MISCELLANEOUS) ×1 IMPLANT
COVER BACK TABLE 60X90IN (DRAPES) ×1 IMPLANT
COVER SURGICAL LIGHT HANDLE (MISCELLANEOUS) ×1 IMPLANT
CUP SUT UNIV REVERS 39 NEU (Shoulder) IMPLANT
DERMABOND ADVANCED .7 DNX12 (GAUZE/BANDAGES/DRESSINGS) ×1 IMPLANT
DRAPE INCISE IOBAN 66X45 STRL (DRAPES) IMPLANT
DRAPE ORTHO SPLIT 77X108 STRL (DRAPES) ×2
DRAPE SHEET LG 3/4 BI-LAMINATE (DRAPES) ×1 IMPLANT
DRAPE SURG 17X11 SM STRL (DRAPES) ×1 IMPLANT
DRAPE SURG ORHT 6 SPLT 77X108 (DRAPES) ×2 IMPLANT
DRAPE TOP 10253 STERILE (DRAPES) ×1 IMPLANT
DRAPE U-SHAPE 47X51 STRL (DRAPES) ×1 IMPLANT
DRESSING AQUACEL AG SP 3.5X6 (GAUZE/BANDAGES/DRESSINGS) ×1 IMPLANT
DRSG AQUACEL AG ADV 3.5X 6 (GAUZE/BANDAGES/DRESSINGS) IMPLANT
DRSG AQUACEL AG ADV 3.5X10 (GAUZE/BANDAGES/DRESSINGS) IMPLANT
DRSG AQUACEL AG SP 3.5X6 (GAUZE/BANDAGES/DRESSINGS) ×1
DRSG TEGADERM 8X12 (GAUZE/BANDAGES/DRESSINGS) ×1 IMPLANT
DURAPREP 26ML APPLICATOR (WOUND CARE) ×1 IMPLANT
ELECT BLADE TIP CTD 4 INCH (ELECTRODE) ×1 IMPLANT
ELECT PENCIL ROCKER SW 15FT (MISCELLANEOUS) ×1 IMPLANT
ELECT REM PT RETURN 15FT ADLT (MISCELLANEOUS) ×1 IMPLANT
FACESHIELD WRAPAROUND (MASK) ×4 IMPLANT
FACESHIELD WRAPAROUND OR TEAM (MASK) ×4 IMPLANT
FIBERTAPE CERCLAGE TLINK SUT (SUTURE) IMPLANT
GLENOSPHERE 39+4 LAT/24 UNI RV (Joint) IMPLANT
GLOVE BIO SURGEON STRL SZ7.5 (GLOVE) ×1 IMPLANT
GLOVE BIO SURGEON STRL SZ8 (GLOVE) ×1 IMPLANT
GLOVE SS BIOGEL STRL SZ 7 (GLOVE) ×1 IMPLANT
GLOVE SS BIOGEL STRL SZ 7.5 (GLOVE) ×1 IMPLANT
GOWN STRL SURGICAL XL XLNG (GOWN DISPOSABLE) ×2 IMPLANT
INSERT HUMERAL M/39 +3/CNSTRND (Miscellaneous) IMPLANT
INSERT HUMERAL MED 39/ +3 (Shoulder) IMPLANT
INSERT MEDIUM HUMERAL 39/ +3 (Shoulder) ×1 IMPLANT
KIT BASIN OR (CUSTOM PROCEDURE TRAY) ×1 IMPLANT
KIT TURNOVER KIT A (KITS) IMPLANT
MANIFOLD NEPTUNE II (INSTRUMENTS) ×1 IMPLANT
NDL TAPERED W/ NITINOL LOOP (MISCELLANEOUS) ×1 IMPLANT
NEEDLE TAPERED W/ NITINOL LOOP (MISCELLANEOUS) ×1 IMPLANT
NS IRRIG 1000ML POUR BTL (IV SOLUTION) ×1 IMPLANT
PACK SHOULDER (CUSTOM PROCEDURE TRAY) ×1 IMPLANT
PAD ARMBOARD 7.5X6 YLW CONV (MISCELLANEOUS) ×1 IMPLANT
PAD COLD SHLDR WRAP-ON (PAD) ×1 IMPLANT
PAD ORTHO SHOULDER 7X19 LRG (SOFTGOODS) IMPLANT
PIN NITINOL TARGETER 2.8 (PIN) IMPLANT
PIN SET MODULAR GLENOID SYSTEM (PIN) IMPLANT
POST MODULAR 25 (Post) ×1 IMPLANT
POST MODULAR MGS BASEPLATE 25 (Post) IMPLANT
RESTRAINT HEAD UNIVERSAL NS (MISCELLANEOUS) ×1 IMPLANT
SCREW PERI LOCK 5.5X32 (Screw) IMPLANT
SCREW PERI NL 4.5X32 (Screw) IMPLANT
SCREW PERIPHERAL 5.5X20 LOCK (Screw) IMPLANT
SLING ARM FOAM STRAP LRG (SOFTGOODS) IMPLANT
SLING ARM FOAM STRAP MED (SOFTGOODS) IMPLANT
SPONGE T-LAP 4X18 ~~LOC~~+RFID (SPONGE) ×1 IMPLANT
SUCTION FRAZIER HANDLE 12FR (TUBING) ×1
SUCTION TUBE FRAZIER 12FR DISP (TUBING) ×1 IMPLANT
SUT FIBERWIRE #2 38 T-5 BLUE (SUTURE)
SUT MNCRL AB 3-0 PS2 18 (SUTURE) ×1 IMPLANT
SUT MON AB 2-0 CT1 36 (SUTURE) ×1 IMPLANT
SUT VIC AB 1 CT1 36 (SUTURE) ×1 IMPLANT
SUTURE FIBERWR #2 38 T-5 BLUE (SUTURE) IMPLANT
SUTURE TAPE 1.3 40 TPR END (SUTURE) ×2 IMPLANT
SUTURETAPE 1.3 40 TPR END (SUTURE) ×2
TOWEL OR 17X26 10 PK STRL BLUE (TOWEL DISPOSABLE) ×1 IMPLANT
TOWEL OR NON WOVEN STRL DISP B (DISPOSABLE) ×1 IMPLANT
TUBE SUCTION HIGH CAP CLEAR NV (SUCTIONS) ×1 IMPLANT
WATER STERILE IRR 1000ML POUR (IV SOLUTION) ×2 IMPLANT
YANKAUER SUCT BULB TIP 10FT TU (MISCELLANEOUS) IMPLANT

## 2021-12-29 NOTE — Op Note (Signed)
12/29/2021  2:48 PM  PATIENT:   Timothy Wells  71 y.o. male  PRE-OPERATIVE DIAGNOSIS:  Left shoulder rotator cuff tear arthropathy  POST-OPERATIVE DIAGNOSIS: Same  PROCEDURE: Left shoulder reverse arthroplasty lysing a press-fit size 9 Arthrex stem with a neutral metaphysis, +3 constrained polyethylene insert, 39/+4 glenosphere and a 10 degree augment +2 baseplate  SURGEON:  Elira Colasanti, Metta Clines M.D.  ASSISTANTS: Jenetta Loges, PA-C  Jenetta Loges, PA-C was utilized as an Environmental consultant throughout this case, essential for help with positioning the patient, positioning extremity, tissue manipulation, implantation of the prosthesis, suture management, wound closure, and intraoperative decision-making.  ANESTHESIA:   General endotracheal and interscalene block with Exparel  EBL: 200 cc  SPECIMEN: None  Drains: None   PATIENT DISPOSITION:  PACU - hemodynamically stable.    PLAN OF CARE: Discharge to home after PACU  Brief history:  Mr. Dupuy is a 71 year old gentleman who has had chronic left shoulder pain and associated weakness and functional limitations related to a rotator cuff tear arthropathy.  He had a previous left shoulder procedure with placement of a subacromial balloon spacer which unfortunately did not provide any symptomatic benefit.  On evaluation in the office he is noted to have a severely painful and guarded range of motion.  Plain radiographs confirm changes consistent with a chronic rotator cuff tear arthropathy.  Due to his ongoing pain and functional imitations and failure to respond to prolonged attempts at conservative management, he is brought to the operating at this time for planned left shoulder reverse arthroplasty.  Preoperatively, I counseled the patient regarding treatment options and risks versus benefits thereof.  Possible surgical complications were all reviewed including potential for bleeding, infection, neurovascular injury, persistent pain, loss of  motion, anesthetic complication, failure of the implant, and possible need for additional surgery. They understand and accept and agrees with our planned procedure.   Procedure detail:  After undergoing routine preop evaluation patient received prophylactic antibiotics and interscalene block with Exparel was established in the holding area by the anesthesia department.  Subsequently placed supine on the operating table and underwent the smooth induction of a general endotracheal anesthesia.  Placed into the beachchair position and appropriately padded and protected.  The left shoulder girdle region was sterilely prepped and draped in standard fashion.  Timeout was called.  A deltopectoral approach to the left shoulder is made through an approximately 10 cm incision.  Skin flaps were elevated dissection carried deeply deltopectoral interval was then developed from proximal to distal with the vein taken laterally.  The conjoined tendon was mobilized and retracted medially.  Adhesions were divided beneath the deltoid and the subscapularis remnant was then separated from the lesser tuberosity and tagged with a pair of suture tape sutures.  There been previous division of the long head bicep tendon.  Capsular attachments were then divided from anterior and inferior margins of the humeral neck and humeral head was then delivered through the wound.  An extra medullary guide was then used to outline the proposed humeral head resection which we performed with an oscillating saw at approximately 20 degrees of retroversion.  Several retained suture anchors were removed from the humeral metaphyseal region.  A metal cap was then placed over the cut proximal humeral surface and we then exposed the glenoid.  We found that due to the extreme elevation of the humeral's that visualization of the glenoid was difficult since the humeral head was so high riding and so we went ahead and removed an  additional 3 mm of bone from the  metaphysis using the oscillating saw and then placed a metal cap of the cut proximal humeral surface.  At this point we were able to obtain properly exposed the glenoid and did identify significant superior tilt.  With this consideration we elected utilizing a augmented baseplate with a 10 degree correction in the correction was made to correct the superior and slight posterior inclination.  A guidepin was then directed into the glenoid and the glenoid was then prepared with the appropriate angled reamer and then the central drill hole was created.  We assembled our 10 degree augmented baseplate with a 25 mm post and this was then impacted achieving excellent purchase and fixation.  We then placed the peripheral screw screws each achieving excellent purchase the first being nonlocking and final 3 locking all of which achieved good purchase and fixation.  A 39/+4 glenosphere was then impacted onto the baseplate and the central locking screw was placed.  At this point we returned attention back to the humeral metaphysis.  The canal was opened and the number of additional suture anchors were removed from the previous shoulder reconstruction.  We then broached up to a size 9 at approximately 20 degrees of retroversion.  A metaphyseal reaming guide was placed.  We then inserted our trial implant and trial reduction showed good motion good stability good soft tissue balance.  This point the final size 9 stem was assembled.  The worst several cystic areas within the humeral metaphysis and we harvested bone graft from the humeral head to bone graft these regions and the stem was then seated.  Trial reduction showed good motion stability and soft tissue balance.  At this point we impacted our final polyethylene but upon impaction of the polyethylene we did notice the development of a unicortical crack on the medial calcar.  With this finding we disimpacted the stem and passed a fiber cerclage around the humeral metaphysis  and this was then tightened and then went ahead and receded the implant achieving excellent purchase and fixation.  Terminal tightening of the cerclage was then completed.  This point we selected a +3 constrained poly which we impacted onto her implant and confirmed that we had good stability of her humeral stem.  A final reduction was then performed which showed excellent motion stability and soft tissue balance all of which were much to our satisfaction.  The wound was then copiously irrigated.  Hemostasis was obtained.  The subscapularis was then confirmed to have good mobility and was repaired back to the eyelets on the collar the implant.  The balance of the vancomycin powder was then sprayed liberally throughout the deep soft tissue planes that should mention that we placed vancomycin powder down into the humeral canal prior to placement of the implant.  The deltopectoral interval was repaired with a series of interrupted figure-of-eight and 1 Vicryl sutures.  2-0 Monocryl used to close the subcu layer and intracuticular 3-0 Monocryl used to close the skin followed by Dermabond and Aquacel dressing.  The left arm was then placed into a sling and the patient was awakened, extubated, and taken to the recovery room in stable condition.  Marin Shutter MD   Contact # [phone]

## 2021-12-29 NOTE — Discharge Instructions (Signed)

## 2021-12-29 NOTE — Anesthesia Postprocedure Evaluation (Signed)
Anesthesia Post Note  Patient: Timothy Wells  Procedure(s) Performed: REVERSE SHOULDER ARTHROPLASTY (Left: Shoulder)     Patient location during evaluation: PACU Anesthesia Type: Regional and General Level of consciousness: awake Pain management: pain level controlled Vital Signs Assessment: post-procedure vital signs reviewed and stable Respiratory status: spontaneous breathing, nonlabored ventilation, respiratory function stable and patient connected to nasal cannula oxygen Cardiovascular status: blood pressure returned to baseline and stable Postop Assessment: no apparent nausea or vomiting Anesthetic complications: no   No notable events documented.  Last Vitals:  Vitals:   12/29/21 1545 12/29/21 1549  BP:  130/64  Pulse: 64 63  Resp: 18 20  Temp: 36.7 C   SpO2: 97% 94%    Last Pain:  Vitals:   12/29/21 1549  TempSrc:   PainSc: 0-No pain                 Dimitri Shakespeare P Felder Lebeda

## 2021-12-29 NOTE — Transfer of Care (Signed)
Immediate Anesthesia Transfer of Care Note  Patient: Timothy Wells  Procedure(s) Performed: REVERSE SHOULDER ARTHROPLASTY (Left: Shoulder)  Patient Location: PACU  Anesthesia Type:GA combined with regional for post-op pain  Level of Consciousness: awake, alert , oriented, and patient cooperative  Airway & Oxygen Therapy: Patient Spontanous Breathing and Patient connected to nasal cannula oxygen  Post-op Assessment: Report given to RN and Post -op Vital signs reviewed and stable  Post vital signs: Reviewed and stable  Last Vitals:  Vitals Value Taken Time  BP    Temp    Pulse    Resp    SpO2      Last Pain:  Vitals:   12/29/21 1035  TempSrc:   PainSc: 0-No pain         Complications: No notable events documented.

## 2021-12-29 NOTE — H&P (Signed)
Timothy Wells    Chief Complaint: Left shoulder rotator cuff tear arthropathy HPI: The patient is a 71 y.o. male with chronic and progressively increasing left shoulder pain related to rotator cuff tear arthropathy.  He has had a previous "balloon spacer" procedure to the left shoulder with no significant benefit.  Due to his increasing functional limitations and failure to respond to prolonged attempts at conservative management, he is brought to the operating room at this time for planned left shoulder reverse arthroplasty  Past Medical History:  Diagnosis Date   A-fib [facility])    Dysrhythmia    wide complex tachycardia   Edema of both lower legs    GERD (gastroesophageal reflux disease)    Memory loss    Restless leg syndrome    Skin cancer    skin cancer face basal cell   Stroke ([place]) 2019      Past Surgical History:  Procedure Laterality Date   ATRIAL FIBRILLATION ABLATION N/A 07/23/2020   Procedure: ATRIAL FIBRILLATION ABLATION;  Surgeon: Vickie Epley, MD;  Location: [place] [facility];  Service: Cardiovascular;  Laterality: N/A;   ATRIAL FIBRILLATION ABLATION N/A 06/30/2021   Procedure: ATRIAL FIBRILLATION ABLATION;  Surgeon: Vickie Epley, MD;  Location: [place] [facility];  Service: Cardiovascular;  Laterality: N/A;   CARDIOVERSION N/A 03/25/2019   Procedure: CARDIOVERSION;  Surgeon: Yolonda Kida, MD;  Location: [facility] ORS;  Service: Cardiovascular;  Laterality: N/A;   CARDIOVERSION N/A 05/18/2020   Procedure: CARDIOVERSION;  Surgeon: Yolonda Kida, MD;  Location: [facility] ORS;  Service: Cardiovascular;  Laterality: N/A;   CARDIOVERSION N/A 01/24/2021   Procedure: CARDIOVERSION;  Surgeon: Wellington Hampshire, MD;  Location: [facility] ORS;  Service: Cardiovascular;  Laterality: N/A;   COLONOSCOPY WITH PROPOFOL N/A 09/10/2019   Procedure: COLONOSCOPY WITH PROPOFOL;  Surgeon: Robert Bellow, MD;  Location: [facility] ENDOSCOPY;  Service: Endoscopy;  Laterality: N/A;    SHOULDER ARTHROSCOPY WITH SUBACROMIAL DECOMPRESSION AND OPEN ROTATOR C Left 03/08/2020   Procedure: Left shoulder arthroscopic subscapularis repair and biodegradable balloon spacer placement with partial infraspinatus repair;  Surgeon: Leim Fabry, MD;  Location: [place];  Service: Orthopedics;  Laterality: Left;   TEE WITHOUT CARDIOVERSION N/A 07/23/2020   Procedure: TRANSESOPHAGEAL ECHOCARDIOGRAM (TEE);  Surgeon: Jerline Pain, MD;  Location: [facility] ENDOSCOPY;  Service: Cardiovascular;  Laterality: N/A;    History reviewed. No pertinent family history.  Social History:  reports that he has never smoked. He has never used smokeless tobacco. He reports current alcohol use. He reports that he does not use drugs.  BMI: Estimated body mass index is 26.17 kg/m as calculated from the following:   Height as of this encounter: '5\' 7"'$  (1.702 m).   Weight as of this encounter: 75.8 kg.  Lab Results  Component Value Date   ALBUMIN 4.1 04/20/2021   Diabetes: Patient does not have a diagnosis of diabetes. Lab Results  Component Value Date   HGBA1C 6.0 (H) 12/16/2021     Smoking Status:   reports that he has never smoked. He has never used smokeless tobacco.     Medications Prior to Admission  Medication Sig Dispense Refill   acetaminophen (TYLENOL) 500 MG tablet Take 1,000 mg by mouth every 6 (six) hours as needed for mild pain or headache.     amiodarone (PACERONE) 100 MG tablet Take 100 mg by mouth daily.     atenolol (TENORMIN) 25 MG tablet Take 25 mg by mouth daily.  atorvastatin (LIPITOR) 80 MG tablet Take 80 mg by mouth every evening.      Cholecalciferol (VITAMIN D) 50 MCG (2000 UT) CAPS Take 4,000 Units by mouth daily.     Chromium-Cinnamon (CINNAMON PLUS CHROMIUM PO) Take 1 tablet by mouth 2 (two) times daily. 500 mg / 100 mg     Cranberry 500 MG CAPS Take 500 mg by mouth daily.     diltiazem (CARDIZEM CD) 360 MG 24 hr capsule Take 1 capsule by mouth once daily 90  capsule 2   docusate sodium (COLACE) 100 MG capsule Take 100 mg by mouth 2 (two) times daily.     doxazosin (CARDURA) 1 MG tablet Take 1 mg by mouth at bedtime.     furosemide (LASIX) 20 MG tablet Take 20 mg by mouth daily as needed for edema.     Glucosamine-Chondroitin (COSAMIN DS PO) Take 1 tablet by mouth 2 (two) times daily.     lisinopril (ZESTRIL) 10 MG tablet Take 10 mg by mouth daily.     magnesium oxide (MAG-OX) 400 (241.3 Mg) MG tablet Take 1 tablet (400 mg total) by mouth daily. 30 tablet 0   Multiple Vitamin (MULTIVITAMIN WITH MINERALS) TABS tablet Take 1 tablet by mouth daily.     Omega-3 Fatty Acids (FISH OIL) 1000 MG CAPS Take 1,000 mg by mouth daily. DHA     OVER THE COUNTER MEDICATION Take 1 Scoop by mouth daily. Super Beets power     rOPINIRole (REQUIP) 1 MG tablet Take 1 mg by mouth at bedtime.     tamsulosin (FLOMAX) 0.4 MG CAPS capsule Take 0.4 mg by mouth daily after supper.     TURMERIC PO Take 1,500 mg by mouth daily.     vitamin B-12 (CYANOCOBALAMIN) 1000 MCG tablet Take 1,000 mcg by mouth daily.     vitamin C (ASCORBIC ACID) 500 MG tablet Take 500 mg by mouth daily.     Vitamin E 450 MG (1000 UT) CAPS Take 1,000 Units by mouth daily.     Zinc 50 MG TABS Take 50 mg by mouth daily.     apixaban (ELIQUIS) 5 MG TABS tablet Take 1 tablet (5 mg total) by mouth 2 (two) times daily. 60 tablet 2     Physical Exam: Inspection of the left shoulder demonstrates the previous arthroscopy portals are well-healed.  There is no erythema or induration.  He has profoundly limited motion as noted at his recent office visit.  Globally weak to manual muscle testing.  Otherwise neurovascular intact.  Radiographs  Plain films of the left shoulder demonstrate a high riding humeral head as well as significant degenerative chondrosis of the glenohumeral joint and changes consistent with a chronic rotator cuff tear arthropathy.  Vitals  Temp:  [97.6 F (36.4 C)] 97.6 F (36.4 C) (11/02  1027) Pulse Rate:  [61-119] 61 (11/02 1122) Resp:  [11-18] 11 (11/02 1122) BP: (97-115)/(57-63) 97/57 (11/02 1117) SpO2:  [97 %-99 %] 99 % (11/02 1122) Weight:  [75.8 kg] 75.8 kg (11/02 1036)  Assessment/Plan  Impression: Left shoulder rotator cuff tear arthropathy  Plan of Action: Procedure(s): REVERSE SHOULDER ARTHROPLASTY  Mikalah Skyles M Cheston Coury 12/29/2021, 11:50 AM Contact # [phone]

## 2021-12-29 NOTE — Anesthesia Procedure Notes (Signed)
Anesthesia Regional Block: Interscalene brachial plexus block   Pre-Anesthetic Checklist: , timeout performed,  Correct Patient, Correct Site, Correct Laterality,  Correct Procedure, Correct Position, site marked,  Risks and benefits discussed,  Surgical consent,  Pre-op evaluation,  At surgeon's request and post-op pain management  Laterality: Left  Prep: chloraprep       Needles:  Injection technique: Single-shot  Needle Type: Echogenic Stimulator Needle     Needle Length: 9cm  Needle Gauge: 21     Additional Needles:   Procedures:,,,, ultrasound used (permanent image in chart),,    Narrative:  Start time: 12/29/2021 11:10 AM End time: 12/29/2021 11:20 AM Injection made incrementally with aspirations every 5 mL.  Performed by: Personally  Anesthesiologist: Murvin Natal, MD  Additional Notes: Functioning IV was confirmed and monitors were applied.  A timeout was performed. Sterile prep, hand hygiene and sterile gloves were used. A 65m 21ga Arrow echogenic stimulator needle was used. Negative aspiration and negative test dose prior to incremental administration of local anesthetic. The patient tolerated the procedure well.  Ultrasound guidance: relevent anatomy identified, needle position confirmed, local anesthetic spread visualized around nerve(s), vascular puncture avoided.  Image printed for medical record.

## 2021-12-29 NOTE — Anesthesia Procedure Notes (Signed)
Procedure Name: Intubation Date/Time: 12/29/2021 1:08 PM  Performed by: Lavina Hamman, CRNAPre-anesthesia Checklist: Patient identified, Emergency Drugs available, Suction available, Patient being monitored and Timeout performed Patient Re-evaluated:Patient Re-evaluated prior to induction Oxygen Delivery Method: Circle system utilized Preoxygenation: Pre-oxygenation with 100% oxygen Induction Type: IV induction Ventilation: Mask ventilation without difficulty Laryngoscope Size: Mac and 4 Grade View: Grade I Tube type: Oral Tube size: 7.0 mm Number of attempts: 1 Airway Equipment and Method: Stylet Placement Confirmation: ETT inserted through vocal cords under direct vision, positive ETCO2, CO2 detector and breath sounds checked- equal and bilateral Secured at: 22 cm Tube secured with: Tape Dental Injury: Teeth and Oropharynx as per pre-operative assessment  Comments: ATOI

## 2021-12-29 NOTE — Evaluation (Signed)
Occupational Therapy Evaluation Patient Details Name: Timothy Wells MRN: [id number] DOB: [birth year] Today's Date: 12/29/2021   History of Present Illness Patient s/p L rTSA   Clinical Impression   Timothy Wells is a 71 year old s/p shoulder replacement without functional use of right dominant upper extremity secondary to effects of surgery and interscalene block and shoulder precautions. Therapist provided education and instruction to patient and daughter in regards to exercises, precautions, positioning, donning upper extremity clothing and bathing while maintaining shoulder precautions, ice and edema management and donning/doffing sling. Patient and daughter verbalized understanding and demonstrated as needed. Patient needed assistance to donn shirt, underwear, pants, socks and shoes and provided with instruction on compensatory strategies to perform ADLs. Patient to follow up with MD for further therapy needs.        Recommendations for follow up therapy are one component of a multi-disciplinary discharge planning process, led by the attending physician.  Recommendations may be updated based on patient status, additional functional criteria and insurance authorization.   Follow Up Recommendations  Follow physician's recommendations for discharge plan and follow up therapies    Assistance Recommended at Discharge Intermittent Supervision/Assistance  Patient can return home with the following A little help with bathing/dressing/bathroom;Assistance with cooking/housework    Functional Status Assessment  Patient has had a recent decline in their functional status and demonstrates the ability to make significant improvements in function in a reasonable and predictable amount of time.  Equipment Recommendations  None recommended by OT    Recommendations for Other Services       Precautions / Restrictions Precautions Precautions: Shoulder Type of Shoulder Precautions: If sitting in  controlled environment, ok to come out of sling to give neck a break. Please sleep in it to protect until follow up in office.    OK to use operative arm for feeding, hygiene and ADLs.  Ok to instruct Pendulums and lap slides as exercises. Ok to use operative arm within the following parameters for ADL purposes    New ROM (8/18)  Ok for PROM, AAROM, AROM within pain tolerance and within the following ROM  ER 20  ABD 45  FE 60 Shoulder Interventions: Shoulder sling/immobilizer Precaution Booklet Issued:  (handouts) Required Braces or Orthoses: Sling Restrictions Weight Bearing Restrictions: Yes LUE Weight Bearing: Non weight bearing      Mobility Bed Mobility                    Transfers                          Balance Overall balance assessment: Mild deficits observed, not formally tested (A mild posterior lean with standing during dressing)                                         ADL either performed or assessed with clinical judgement   ADL                                         General ADL Comments: Needed assistance to don UB And LB clothing. Educated on compensatory strategies.     Vision Baseline Vision/History: 1 Wears glasses       Perception     Praxis  Pertinent Vitals/Pain Pain Assessment Pain Assessment: No/denies pain     Hand Dominance     Extremity/Trunk Assessment Upper Extremity Assessment Upper Extremity Assessment: LUE deficits/detail LUE Deficits / Details: impaired motor control and sensation secondary to block   Lower Extremity Assessment Lower Extremity Assessment: Overall WFL for tasks assessed   Cervical / Trunk Assessment Cervical / Trunk Assessment: Normal   Communication     Cognition Arousal/Alertness: Awake/alert Behavior During Therapy: WFL for tasks assessed/performed Overall Cognitive Status: Within Functional Limits for tasks assessed                                        General Comments       Exercises     Shoulder Instructions Shoulder Instructions Donning/doffing shirt without moving shoulder: Caregiver independent with task Method for sponge bathing under operated UE: Caregiver independent with task Donning/doffing sling/immobilizer: Caregiver independent with task Correct positioning of sling/immobilizer: Caregiver independent with task Pendulum exercises (written home exercise program): Caregiver independent with task ROM for elbow, wrist and digits of operated UE: Caregiver independent with task Sling wearing schedule (on at all times/off for ADL's): Caregiver independent with task Proper positioning of operated UE when showering: Caregiver independent with task Dressing change: Caregiver independent with task Positioning of UE while sleeping: Caregiver independent with task    Home Living Family/patient expects to be discharged to:: Private residence Living Arrangements: Alone Available Help at Discharge: Family;Available 24 hours/day                                    Prior Functioning/Environment                          OT Problem List: Decreased strength;Decreased range of motion;Impaired UE functional use;Pain      OT Treatment/Interventions:      OT Goals(Current goals can be found in the care plan section) Acute Rehab OT Goals OT Goal Formulation: All assessment and education complete, DC therapy  OT Frequency:      Co-evaluation              AM-PAC OT "6 Clicks" Daily Activity     Outcome Measure Help from another person eating meals?: A Little Help from another person taking care of personal grooming?: A Little Help from another person toileting, which includes using toliet, bedpan, or urinal?: A Little Help from another person bathing (including washing, rinsing, drying)?: A Little Help from another person to put on and taking off regular upper body clothing?: A  Lot Help from another person to put on and taking off regular lower body clothing?: A Lot 6 Click Score: 16   End of Session Nurse Communication:  (OT education complete)  Activity Tolerance: Patient tolerated treatment well Patient left: in chair;with family/visitor present;with nursing/sitter in room  OT Visit Diagnosis: Pain                Time: 7939-0300 OT Time Calculation (min): 24 min Charges:  OT General Charges $OT Visit: 1 Visit OT Evaluation $OT Eval Low Complexity: 1 Low OT Treatments $Self Care/Home Management : 8-22 mins  Gustavo Lah, OTR/L Acute Care Rehab Services  Office [phone]   Lenward Chancellor 12/29/2021, 4:22 PM

## 2021-12-29 NOTE — Anesthesia Preprocedure Evaluation (Addendum)
Anesthesia Evaluation  Patient identified by MRN, date of birth, ID band Patient awake    Reviewed: Allergy & Precautions, NPO status , Patient's Chart, lab work & pertinent test results  Airway Mallampati: II  TM Distance: >3 FB Neck ROM: Full    Dental  (+) Missing, Chipped,    Pulmonary shortness of breath   Pulmonary exam normal        Cardiovascular hypertension, Pt. on medications and Pt. on home beta blockers Normal cardiovascular exam+ dysrhythmias Atrial Fibrillation      Neuro/Psych TIA negative psych ROS   GI/Hepatic negative GI ROS, Neg liver ROS,,,  Endo/Other  negative endocrine ROS    Renal/GU negative Renal ROS     Musculoskeletal negative musculoskeletal ROS (+)    Abdominal   Peds  Hematology  (+) Blood dyscrasia (Eliquis), anemia   Anesthesia Other Findings Left shoulder rotator cuff tear arthropathy  Reproductive/Obstetrics                             Anesthesia Physical Anesthesia Plan  ASA: 3  Anesthesia Plan: General and Regional   Post-op Pain Management: Regional block*   Induction: Intravenous  PONV Risk Score and Plan: 2 and Ondansetron, Dexamethasone and Treatment may vary due to age or medical condition  Airway Management Planned: Oral ETT  Additional Equipment:   Intra-op Plan:   Post-operative Plan: Extubation in OR  Informed Consent: I have reviewed the patients History and Physical, chart, labs and discussed the procedure including the risks, benefits and alternatives for the proposed anesthesia with the patient or authorized representative who has indicated his/her understanding and acceptance.     Dental advisory given  Plan Discussed with: CRNA  Anesthesia Plan Comments:        Anesthesia Quick Evaluation

## 2021-12-30 ENCOUNTER — Encounter (HOSPITAL_COMMUNITY): Payer: Self-pay | Admitting: Orthopedic Surgery

## 2022-01-11 ENCOUNTER — Other Ambulatory Visit: Payer: Self-pay | Admitting: Cardiology

## 2022-01-11 DIAGNOSIS — I4819 Other persistent atrial fibrillation: Secondary | ICD-10-CM

## 2022-01-12 NOTE — Telephone Encounter (Signed)
Prescription refill request for Eliquis received. Indication: Afib  Last office visit: 10/05/21 Quentin Ore)  Scr: 0.80 (12/16/21)  Age: 71 Weight: 75.8kg  Appropriate dose and refill sent to requested pharmacy.

## 2022-01-29 ENCOUNTER — Other Ambulatory Visit: Payer: Self-pay

## 2022-01-29 ENCOUNTER — Encounter: Payer: Self-pay | Admitting: Emergency Medicine

## 2022-01-29 ENCOUNTER — Emergency Department: Payer: PRIVATE HEALTH INSURANCE

## 2022-01-29 ENCOUNTER — Emergency Department
Admission: EM | Admit: 2022-01-29 | Discharge: 2022-01-29 | Disposition: A | Discharge status: Home / Self Care | Payer: PRIVATE HEALTH INSURANCE | Attending: Emergency Medicine | Admitting: Emergency Medicine

## 2022-01-29 DIAGNOSIS — Z8673 Personal history of transient ischemic attack (TIA), and cerebral infarction without residual deficits: Secondary | ICD-10-CM | POA: Insufficient documentation

## 2022-01-29 DIAGNOSIS — M79672 Pain in left foot: Secondary | ICD-10-CM | POA: Diagnosis present

## 2022-01-29 DIAGNOSIS — I10 Essential (primary) hypertension: Secondary | ICD-10-CM | POA: Diagnosis not present

## 2022-01-29 DIAGNOSIS — E119 Type 2 diabetes mellitus without complications: Secondary | ICD-10-CM | POA: Insufficient documentation

## 2022-01-29 DIAGNOSIS — I480 Paroxysmal atrial fibrillation: Secondary | ICD-10-CM | POA: Insufficient documentation

## 2022-01-29 LAB — CBC WITH DIFFERENTIAL/PLATELET
Abs Immature Granulocytes: 0.02 10*3/uL (ref 0.00–0.07)
Basophils Absolute: 0.1 10*3/uL (ref 0.0–0.1)
Basophils Relative: 1 %
Eosinophils Absolute: 0.1 10*3/uL (ref 0.0–0.5)
Eosinophils Relative: 1 %
HCT: 33.9 % — ABNORMAL LOW (ref 39.0–52.0)
Hemoglobin: 11.4 g/dL — ABNORMAL LOW (ref 13.0–17.0)
Immature Granulocytes: 0 %
Lymphocytes Relative: 17 %
Lymphs Abs: 1.5 10*3/uL (ref 0.7–4.0)
MCH: 31 pg (ref 26.0–34.0)
MCHC: 33.6 g/dL (ref 30.0–36.0)
MCV: 92.1 fL (ref 80.0–100.0)
Monocytes Absolute: 0.9 10*3/uL (ref 0.1–1.0)
Monocytes Relative: 10 %
Neutro Abs: 6.3 10*3/uL (ref 1.7–7.7)
Neutrophils Relative %: 71 %
Platelets: 272 10*3/uL (ref 150–400)
RBC: 3.68 MIL/uL — ABNORMAL LOW (ref 4.22–5.81)
RDW: 14.9 % (ref 11.5–15.5)
WBC: 8.8 10*3/uL (ref 4.0–10.5)
nRBC: 0 % (ref 0.0–0.2)

## 2022-01-29 LAB — BASIC METABOLIC PANEL
Anion gap: 8 (ref 5–15)
BUN: 24 mg/dL — ABNORMAL HIGH (ref 8–23)
CO2: 25 mmol/L (ref 22–32)
Calcium: 8.8 mg/dL — ABNORMAL LOW (ref 8.9–10.3)
Chloride: 104 mmol/L (ref 98–111)
Creatinine, Ser: 0.73 mg/dL (ref 0.61–1.24)
GFR, Estimated: >60 mL/min (ref >60)
Glucose, Bld: 106 mg/dL — ABNORMAL HIGH (ref 70–99)
Potassium: 3.6 mmol/L (ref 3.5–5.1)
Sodium: 137 mmol/L (ref 135–145)

## 2022-01-29 NOTE — ED Provider Notes (Signed)
[facility] Provider Note    Event Date/Time   First MD Initiated Contact with Patient 01/29/22 1557     (approximate)   History   Foot Pain   HPI  Timothy Wells is a 71 y.o. male with a past medical history of atrial fibrillation, CVA, hyperlipidemia, hypertension, type 2 diabetes who presents today for evaluation of foot pain.  Patient reports that he was trying to put his shoe on at [employer] today and noticed that a pain to the top of his left foot as well as to the ball of his left foot.  He does not think that there is any specific injury.  He notices that his pain is worse with walking.  His only members are concerned that he has developed a blood clot.  He has not had any history of blood clots.  He has not noticed any calf pain.  He reports that the swelling is in the distal portion of his foot only.  He has not noticed any wounds or discoloration.  No fevers or chills.  Patient Active Problem List   Diagnosis Date Noted   Persistent atrial fibrillation ([place]) 12/15/2020   Atypical atrial flutter ([place]) 11/17/2020   Secondary hypercoagulable state ([place]) 11/17/2020   Dizziness    Wide-complex tachycardia 01/21/2019   Hypotension 01/21/2019   History of CVA (cerebrovascular accident) 01/21/2019          Physical Exam   Triage Vital Signs: ED Triage Vitals  Enc Vitals Group     BP 01/29/22 1420 105/71     Pulse Rate 01/29/22 1420 89     Resp 01/29/22 1420 18     Temp 01/29/22 1420 98.4 F (36.9 C)     Temp Source 01/29/22 1420 Oral     SpO2 01/29/22 1420 98 %     Weight 01/29/22 1418 168 lb (76.2 kg)     Height 01/29/22 1418 '5\' 7"'$  (1.702 m)     Head Circumference --      Peak Flow --      Pain Score 01/29/22 1418 6     Pain Loc --      Pain Edu? --      Excl. in [place]? --     Most recent vital signs: Vitals:   01/29/22 1420  BP: 105/71  Pulse: 89  Resp: 18  Temp: 98.4 F (36.9 C)  SpO2: 98%    Physical Exam Vitals and nursing  note reviewed.  Constitutional:      General: Awake and alert. No acute distress.    Appearance: Normal appearance. The patient is normal weight.  HENT:     Head: Normocephalic and atraumatic.     Mouth: Mucous membranes are moist.  Eyes:     General: PERRL. Normal EOMs        Right eye: No discharge.        Left eye: No discharge.     Conjunctiva/sclera: Conjunctivae normal.  Cardiovascular:     Rate and Rhythm: Normal rate and regular rhythm.     Pulses: Normal pulses.  Pulmonary:     Effort: Pulmonary effort is normal. No respiratory distress.     Breath sounds: Normal breath sounds.  Abdominal:     Abdomen is soft. There is no abdominal tenderness. No rebound or guarding. No distention. Musculoskeletal:        General: No swelling. Normal range of motion.     Cervical back: Normal range of motion  and neck supple.  Left foot: Mild swelling noted to the medial aspect of the plantar ball of the foot with callus noted.  Nontender to palpation.  He has mild tenderness palpation to the top of the foot with palpable ganglion cyst.  No overlying erythema.  No warmth.  Normal 2+ pedal pulses.  Normal capillary refill.  Normal range of motion at the ankle.  No swelling or tenderness palpation to the calf. Skin:    General: Skin is warm and dry.     Capillary Refill: Capillary refill takes less than 2 seconds.     Findings: No rash.  Neurological:     Mental Status: The patient is awake and alert.      ED Results / Procedures / Treatments   Labs (all labs ordered are listed, but only abnormal results are displayed) Labs Reviewed - No data to display   EKG     RADIOLOGY I independently reviewed and interpreted imaging and agree with radiologists findings.     PROCEDURES:  Critical Care performed:   Procedures   MEDICATIONS ORDERED IN ED: Medications - No data to display   IMPRESSION / MDM / [place] / ED COURSE  I reviewed the triage vital signs and  the nursing notes.   Differential diagnosis includes, but is not limited to, ***   Patient's presentation is most consistent with {EM COPA:27473}   {If the patient is on the monitor, remove the brackets and asterisks on the sentence below and remember to document it as a Procedure as well. Otherwise delete the sentence below:1} {**The patient is on the cardiac monitor to evaluate for evidence of arrhythmia and/or significant heart rate changes.**} {Remember to include, when applicable, any/all of the following data: independent review of imaging independent review of labs (comment specifically on pertinent positives and negatives) review of specific prior hospitalizations, PCP/specialist notes, etc. discuss meds given and prescribed document any discussion with consultants (including hospitalists) any clinical decision tools you used and why (PECARN, NEXUS, etc.) did you consider admitting the patient? document social determinants of health affecting patient's care (homelessness, inability to follow up in a timely fashion, etc) document any pre-existing conditions increasing risk on current visit (e.g. diabetes and HTN increasing danger of high-risk chest pain/ACS) describes what meds you gave (especially parenteral) and why any other interventions?:1}     FINAL CLINICAL IMPRESSION(S) / ED DIAGNOSES   Final diagnoses:  None     Rx / DC Orders   ED Discharge Orders     None        Note:  This document was prepared using Dragon voice recognition software and may include unintentional dictation errors.

## 2022-01-29 NOTE — ED Triage Notes (Signed)
Pt via POV from home. Pt c/o L foot pain, states that he was trying to put his shoe on he started having pain and states that the pain has been increasingly worse. Denies any injury. Pt has a hx of arthritis. Pt c/o L foot pain and swelling. Pt is A&OX4 and NAD. Pt states it is worse when he walks. Ambulatory.

## 2022-01-29 NOTE — ED Provider Triage Note (Signed)
Emergency Medicine Provider Triage Evaluation Note  LEMOND GRIFFEE , a 71 y.o. male  was evaluated in triage.  Pt complains of non-traumatic left foot pain. Pain started when trying to put his shoe on this morning. Pain worse with ambulation.  Physical Exam  BP 105/71 (BP Location: Left Arm)   Pulse 89   Temp 98.4 F (36.9 C) (Oral)   Resp 18   Ht '5\' 7"'$  (1.702 m)   Wt 76.2 kg   SpO2 98%   BMI 26.31 kg/m  Gen:   Awake, no distress   Resp:  Normal effort  MSK:   Moves extremities without difficulty  Other:    Medical Decision Making  Medically screening exam initiated at 2:36 PM.  Appropriate orders placed.  Shravan Salahuddin Bernier was informed that the remainder of the evaluation will be completed by another provider, this initial triage assessment does not replace that evaluation, and the importance of remaining in the ED until their evaluation is complete.    Victorino Dike, FNP 01/29/22 1553

## 2022-01-29 NOTE — Discharge Instructions (Signed)
Your xray shows arthritis of your foot. Your ultrasound and blood work are reassuring. Please follow up with your outpatient provider. It was a pleasure caring for you.

## 2022-02-28 DIAGNOSIS — Z96612 Presence of left artificial shoulder joint: Secondary | ICD-10-CM | POA: Diagnosis not present

## 2022-02-28 DIAGNOSIS — M25512 Pain in left shoulder: Secondary | ICD-10-CM | POA: Diagnosis not present

## 2022-03-02 DIAGNOSIS — M25512 Pain in left shoulder: Secondary | ICD-10-CM | POA: Diagnosis not present

## 2022-03-02 DIAGNOSIS — Z96612 Presence of left artificial shoulder joint: Secondary | ICD-10-CM | POA: Diagnosis not present

## 2022-03-07 DIAGNOSIS — Z96612 Presence of left artificial shoulder joint: Secondary | ICD-10-CM | POA: Diagnosis not present

## 2022-03-07 DIAGNOSIS — M25512 Pain in left shoulder: Secondary | ICD-10-CM | POA: Diagnosis not present

## 2022-03-13 DIAGNOSIS — M25512 Pain in left shoulder: Secondary | ICD-10-CM | POA: Diagnosis not present

## 2022-03-13 DIAGNOSIS — Z96612 Presence of left artificial shoulder joint: Secondary | ICD-10-CM | POA: Diagnosis not present

## 2022-03-15 DIAGNOSIS — M25512 Pain in left shoulder: Secondary | ICD-10-CM | POA: Diagnosis not present

## 2022-03-15 DIAGNOSIS — Z96612 Presence of left artificial shoulder joint: Secondary | ICD-10-CM | POA: Diagnosis not present

## 2022-03-21 DIAGNOSIS — M25512 Pain in left shoulder: Secondary | ICD-10-CM | POA: Diagnosis not present

## 2022-03-21 DIAGNOSIS — Z96612 Presence of left artificial shoulder joint: Secondary | ICD-10-CM | POA: Diagnosis not present

## 2022-03-23 DIAGNOSIS — M25512 Pain in left shoulder: Secondary | ICD-10-CM | POA: Diagnosis not present

## 2022-03-23 DIAGNOSIS — Z96612 Presence of left artificial shoulder joint: Secondary | ICD-10-CM | POA: Diagnosis not present

## 2022-03-28 DIAGNOSIS — Z96612 Presence of left artificial shoulder joint: Secondary | ICD-10-CM | POA: Diagnosis not present

## 2022-03-28 DIAGNOSIS — M25512 Pain in left shoulder: Secondary | ICD-10-CM | POA: Diagnosis not present

## 2022-03-29 DIAGNOSIS — Z96612 Presence of left artificial shoulder joint: Secondary | ICD-10-CM | POA: Diagnosis not present

## 2022-03-29 DIAGNOSIS — Z471 Aftercare following joint replacement surgery: Secondary | ICD-10-CM | POA: Diagnosis not present

## 2022-03-30 DIAGNOSIS — M25512 Pain in left shoulder: Secondary | ICD-10-CM | POA: Diagnosis not present

## 2022-03-30 DIAGNOSIS — Z96612 Presence of left artificial shoulder joint: Secondary | ICD-10-CM | POA: Diagnosis not present

## 2022-04-04 DIAGNOSIS — M25512 Pain in left shoulder: Secondary | ICD-10-CM | POA: Diagnosis not present

## 2022-04-04 DIAGNOSIS — Z96612 Presence of left artificial shoulder joint: Secondary | ICD-10-CM | POA: Diagnosis not present

## 2022-04-06 ENCOUNTER — Encounter (HOSPITAL_COMMUNITY): Payer: Self-pay | Admitting: *Deleted

## 2022-04-06 DIAGNOSIS — Z96612 Presence of left artificial shoulder joint: Secondary | ICD-10-CM | POA: Diagnosis not present

## 2022-04-06 DIAGNOSIS — M25512 Pain in left shoulder: Secondary | ICD-10-CM | POA: Diagnosis not present

## 2022-04-11 DIAGNOSIS — M25512 Pain in left shoulder: Secondary | ICD-10-CM | POA: Diagnosis not present

## 2022-04-11 DIAGNOSIS — Z96612 Presence of left artificial shoulder joint: Secondary | ICD-10-CM | POA: Diagnosis not present

## 2022-04-13 DIAGNOSIS — M25512 Pain in left shoulder: Secondary | ICD-10-CM | POA: Diagnosis not present

## 2022-04-13 DIAGNOSIS — Z96612 Presence of left artificial shoulder joint: Secondary | ICD-10-CM | POA: Diagnosis not present

## 2022-04-18 DIAGNOSIS — Z96612 Presence of left artificial shoulder joint: Secondary | ICD-10-CM | POA: Diagnosis not present

## 2022-04-18 DIAGNOSIS — M25512 Pain in left shoulder: Secondary | ICD-10-CM | POA: Diagnosis not present

## 2022-04-20 DIAGNOSIS — Z96612 Presence of left artificial shoulder joint: Secondary | ICD-10-CM | POA: Diagnosis not present

## 2022-04-20 DIAGNOSIS — M25512 Pain in left shoulder: Secondary | ICD-10-CM | POA: Diagnosis not present

## 2022-04-25 DIAGNOSIS — M25512 Pain in left shoulder: Secondary | ICD-10-CM | POA: Diagnosis not present

## 2022-04-25 DIAGNOSIS — Z96612 Presence of left artificial shoulder joint: Secondary | ICD-10-CM | POA: Diagnosis not present

## 2022-04-27 DIAGNOSIS — Z96612 Presence of left artificial shoulder joint: Secondary | ICD-10-CM | POA: Diagnosis not present

## 2022-04-27 DIAGNOSIS — M25512 Pain in left shoulder: Secondary | ICD-10-CM | POA: Diagnosis not present

## 2022-05-23 DIAGNOSIS — R600 Localized edema: Secondary | ICD-10-CM | POA: Diagnosis not present

## 2022-05-23 DIAGNOSIS — Z5181 Encounter for therapeutic drug level monitoring: Secondary | ICD-10-CM | POA: Diagnosis not present

## 2022-05-23 DIAGNOSIS — Z79899 Other long term (current) drug therapy: Secondary | ICD-10-CM | POA: Diagnosis not present

## 2022-05-23 DIAGNOSIS — I4819 Other persistent atrial fibrillation: Secondary | ICD-10-CM | POA: Diagnosis not present

## 2022-05-23 DIAGNOSIS — I10 Essential (primary) hypertension: Secondary | ICD-10-CM | POA: Diagnosis not present

## 2022-06-27 DIAGNOSIS — R600 Localized edema: Secondary | ICD-10-CM | POA: Diagnosis not present

## 2022-06-27 DIAGNOSIS — I4819 Other persistent atrial fibrillation: Secondary | ICD-10-CM | POA: Diagnosis not present

## 2022-06-27 DIAGNOSIS — R011 Cardiac murmur, unspecified: Secondary | ICD-10-CM | POA: Diagnosis not present

## 2022-06-27 DIAGNOSIS — R635 Abnormal weight gain: Secondary | ICD-10-CM | POA: Diagnosis not present

## 2022-07-11 DIAGNOSIS — I4819 Other persistent atrial fibrillation: Secondary | ICD-10-CM | POA: Diagnosis not present

## 2022-07-11 DIAGNOSIS — R600 Localized edema: Secondary | ICD-10-CM | POA: Diagnosis not present

## 2022-07-11 DIAGNOSIS — R008 Other abnormalities of heart beat: Secondary | ICD-10-CM | POA: Diagnosis not present

## 2022-07-19 DIAGNOSIS — D229 Melanocytic nevi, unspecified: Secondary | ICD-10-CM | POA: Diagnosis not present

## 2022-07-19 DIAGNOSIS — L821 Other seborrheic keratosis: Secondary | ICD-10-CM | POA: Diagnosis not present

## 2022-07-19 DIAGNOSIS — L814 Other melanin hyperpigmentation: Secondary | ICD-10-CM | POA: Diagnosis not present

## 2022-07-19 DIAGNOSIS — L570 Actinic keratosis: Secondary | ICD-10-CM | POA: Diagnosis not present

## 2022-07-19 DIAGNOSIS — Z85828 Personal history of other malignant neoplasm of skin: Secondary | ICD-10-CM | POA: Diagnosis not present

## 2022-07-19 DIAGNOSIS — L578 Other skin changes due to chronic exposure to nonionizing radiation: Secondary | ICD-10-CM | POA: Diagnosis not present

## 2022-09-26 DIAGNOSIS — Z9189 Other specified personal risk factors, not elsewhere classified: Secondary | ICD-10-CM | POA: Diagnosis not present

## 2022-09-26 DIAGNOSIS — Z79899 Other long term (current) drug therapy: Secondary | ICD-10-CM | POA: Diagnosis not present

## 2022-09-26 DIAGNOSIS — I6522 Occlusion and stenosis of left carotid artery: Secondary | ICD-10-CM | POA: Diagnosis not present

## 2022-09-26 DIAGNOSIS — Z8673 Personal history of transient ischemic attack (TIA), and cerebral infarction without residual deficits: Secondary | ICD-10-CM | POA: Diagnosis not present

## 2022-09-26 DIAGNOSIS — R600 Localized edema: Secondary | ICD-10-CM | POA: Diagnosis not present

## 2022-09-26 DIAGNOSIS — R9431 Abnormal electrocardiogram [ECG] [EKG]: Secondary | ICD-10-CM | POA: Diagnosis not present

## 2022-09-26 DIAGNOSIS — R030 Elevated blood-pressure reading, without diagnosis of hypertension: Secondary | ICD-10-CM | POA: Diagnosis not present

## 2022-09-26 DIAGNOSIS — E7849 Other hyperlipidemia: Secondary | ICD-10-CM | POA: Diagnosis not present

## 2022-09-26 DIAGNOSIS — I4819 Other persistent atrial fibrillation: Secondary | ICD-10-CM | POA: Diagnosis not present

## 2022-09-27 DIAGNOSIS — R030 Elevated blood-pressure reading, without diagnosis of hypertension: Secondary | ICD-10-CM | POA: Diagnosis not present

## 2022-09-27 DIAGNOSIS — D6869 Other thrombophilia: Secondary | ICD-10-CM | POA: Diagnosis not present

## 2022-09-27 DIAGNOSIS — E7849 Other hyperlipidemia: Secondary | ICD-10-CM | POA: Diagnosis not present

## 2022-09-27 DIAGNOSIS — Z125 Encounter for screening for malignant neoplasm of prostate: Secondary | ICD-10-CM | POA: Diagnosis not present

## 2022-09-27 DIAGNOSIS — R413 Other amnesia: Secondary | ICD-10-CM | POA: Diagnosis not present

## 2022-09-27 DIAGNOSIS — I484 Atypical atrial flutter: Secondary | ICD-10-CM | POA: Diagnosis not present

## 2022-09-27 DIAGNOSIS — N400 Enlarged prostate without lower urinary tract symptoms: Secondary | ICD-10-CM | POA: Diagnosis not present

## 2022-09-27 DIAGNOSIS — Z8673 Personal history of transient ischemic attack (TIA), and cerebral infarction without residual deficits: Secondary | ICD-10-CM | POA: Diagnosis not present

## 2022-09-27 DIAGNOSIS — E559 Vitamin D deficiency, unspecified: Secondary | ICD-10-CM | POA: Diagnosis not present

## 2022-09-27 DIAGNOSIS — E119 Type 2 diabetes mellitus without complications: Secondary | ICD-10-CM | POA: Diagnosis not present

## 2022-09-27 DIAGNOSIS — D649 Anemia, unspecified: Secondary | ICD-10-CM | POA: Diagnosis not present

## 2022-09-27 DIAGNOSIS — I4819 Other persistent atrial fibrillation: Secondary | ICD-10-CM | POA: Diagnosis not present

## 2022-09-27 DIAGNOSIS — Z0000 Encounter for general adult medical examination without abnormal findings: Secondary | ICD-10-CM | POA: Diagnosis not present

## 2022-09-27 DIAGNOSIS — R000 Tachycardia, unspecified: Secondary | ICD-10-CM | POA: Diagnosis not present

## 2022-09-27 DIAGNOSIS — G2581 Restless legs syndrome: Secondary | ICD-10-CM | POA: Diagnosis not present

## 2022-10-17 ENCOUNTER — Other Ambulatory Visit: Payer: Self-pay

## 2022-10-17 ENCOUNTER — Encounter: Payer: Self-pay | Admitting: Emergency Medicine

## 2022-10-17 ENCOUNTER — Emergency Department
Admission: EM | Admit: 2022-10-17 | Discharge: 2022-10-17 | Disposition: A | Discharge status: Home / Self Care | Payer: HMO | Attending: Emergency Medicine | Admitting: Emergency Medicine

## 2022-10-17 DIAGNOSIS — I4510 Unspecified right bundle-branch block: Secondary | ICD-10-CM | POA: Insufficient documentation

## 2022-10-17 DIAGNOSIS — Z8673 Personal history of transient ischemic attack (TIA), and cerebral infarction without residual deficits: Secondary | ICD-10-CM | POA: Diagnosis not present

## 2022-10-17 DIAGNOSIS — R000 Tachycardia, unspecified: Secondary | ICD-10-CM | POA: Diagnosis present

## 2022-10-17 DIAGNOSIS — R790 Abnormal level of blood mineral: Secondary | ICD-10-CM | POA: Diagnosis not present

## 2022-10-17 DIAGNOSIS — R008 Other abnormalities of heart beat: Secondary | ICD-10-CM | POA: Diagnosis not present

## 2022-10-17 DIAGNOSIS — I10 Essential (primary) hypertension: Secondary | ICD-10-CM | POA: Diagnosis not present

## 2022-10-17 DIAGNOSIS — I4891 Unspecified atrial fibrillation: Secondary | ICD-10-CM | POA: Insufficient documentation

## 2022-10-17 DIAGNOSIS — I4719 Other supraventricular tachycardia: Secondary | ICD-10-CM | POA: Insufficient documentation

## 2022-10-17 DIAGNOSIS — E119 Type 2 diabetes mellitus without complications: Secondary | ICD-10-CM | POA: Insufficient documentation

## 2022-10-17 DIAGNOSIS — E785 Hyperlipidemia, unspecified: Secondary | ICD-10-CM | POA: Insufficient documentation

## 2022-10-17 LAB — CBC WITH DIFFERENTIAL/PLATELET
Abs Immature Granulocytes: 0.02 10*3/uL (ref 0.00–0.07)
Basophils Absolute: 0 10*3/uL (ref 0.0–0.1)
Basophils Relative: 0 %
Eosinophils Absolute: 0 10*3/uL (ref 0.0–0.5)
Eosinophils Relative: 1 %
HCT: 38.4 % — ABNORMAL LOW (ref 39.0–52.0)
Hemoglobin: 12.9 g/dL — ABNORMAL LOW (ref 13.0–17.0)
Immature Granulocytes: 0 %
Lymphocytes Relative: 19 %
Lymphs Abs: 1.4 10*3/uL (ref 0.7–4.0)
MCH: 30.6 pg (ref 26.0–34.0)
MCHC: 33.6 g/dL (ref 30.0–36.0)
MCV: 91 fL (ref 80.0–100.0)
Monocytes Absolute: 0.7 10*3/uL (ref 0.1–1.0)
Monocytes Relative: 9 %
Neutro Abs: 5.1 10*3/uL (ref 1.7–7.7)
Neutrophils Relative %: 71 %
Platelets: 207 10*3/uL (ref 150–400)
RBC: 4.22 MIL/uL (ref 4.22–5.81)
RDW: 13.8 % (ref 11.5–15.5)
WBC: 7.2 10*3/uL (ref 4.0–10.5)
nRBC: 0 % (ref 0.0–0.2)

## 2022-10-17 LAB — COMPREHENSIVE METABOLIC PANEL
ALT: 22 U/L (ref 0–44)
AST: 26 U/L (ref 15–41)
Albumin: 4.1 g/dL (ref 3.5–5.0)
Alkaline Phosphatase: 63 U/L (ref 38–126)
Anion gap: 8 (ref 5–15)
BUN: 18 mg/dL (ref 8–23)
CO2: 24 mmol/L (ref 22–32)
Calcium: 8.6 mg/dL — ABNORMAL LOW (ref 8.9–10.3)
Chloride: 101 mmol/L (ref 98–111)
Creatinine, Ser: 0.69 mg/dL (ref 0.61–1.24)
GFR, Estimated: >60 mL/min (ref >60)
Glucose, Bld: 126 mg/dL — ABNORMAL HIGH (ref 70–99)
Potassium: 3.2 mmol/L — ABNORMAL LOW (ref 3.5–5.1)
Sodium: 133 mmol/L — ABNORMAL LOW (ref 135–145)
Total Bilirubin: 0.5 mg/dL (ref 0.3–1.2)
Total Protein: 7.2 g/dL (ref 6.5–8.1)

## 2022-10-17 LAB — TROPONIN I (HIGH SENSITIVITY): Troponin I (High Sensitivity): 16 ng/L (ref <18)

## 2022-10-17 LAB — BRAIN NATRIURETIC PEPTIDE: B Natriuretic Peptide: 222 pg/mL — ABNORMAL HIGH (ref 0.0–100.0)

## 2022-10-17 MED ORDER — DILTIAZEM HCL 25 MG/5ML IV SOLN
INTRAVENOUS | Status: AC
  Filled 2022-10-17: qty 5

## 2022-10-17 MED ORDER — DILTIAZEM HCL 25 MG/5ML IV SOLN
10.0000 mg | Freq: Once | INTRAVENOUS | Status: AC
  Administered 2022-10-17: 10 mg via INTRAVENOUS

## 2022-10-17 NOTE — ED Provider Notes (Signed)
[facility] Provider Note    Event Date/Time   First MD Initiated Contact with Patient 10/17/22 1011     (approximate)   History   Tachycardia   HPI  Timothy Wells is a 72 y.o. male with a history of atrial fibrillation, hypertension, hyperlipidemia, CVA, and diabetes who presents with chest heaviness, acute onset this morning while the patient was waiting for a stress test.  He was noted to have a low blood pressure and a heart rate in the 170s.  He denied any pain in the chest.  He has no shortness of breath or palpitations.  He did not feel lightheaded.  He states he is feeling better now after having gotten IV Cardizem with his heart rate going down to the 90s.  I reviewed the past medical records.  The patient's most recent outpatient encounter before the stress test today was with family medicine on 7/31 for follow-up of his chronic conditions.  He has no recent hospitalizations.  Physical Exam   Triage Vital Signs: ED Triage Vitals  Encounter Vitals Group     BP 10/17/22 1010 128/84     Systolic BP Percentile --      Diastolic BP Percentile --      Pulse Rate 10/17/22 1010 (!) 169     Resp 10/17/22 1010 18     Temp 10/17/22 1010 98.4 F (36.9 C)     Temp Source 10/17/22 1010 Oral     SpO2 10/17/22 1010 98 %     Weight 10/17/22 1007 167 lb 15.9 oz (76.2 kg)     Height 10/17/22 1007 5\' 7"  (1.702 m)     Head Circumference --      Peak Flow --      Pain Score 10/17/22 1007 0     Pain Loc --      Pain Education --      Exclude from Growth Chart --     Most recent vital signs: Vitals:   10/17/22 1030 10/17/22 1100  BP: 112/60 102/66  Pulse: 78 75  Resp: 13 14  Temp:    SpO2: 95% 95%     General: Awake, no distress.  CV:  Good peripheral perfusion.  Resp:  Normal effort.  Lungs CTAB. Abd:  No distention. Other:  No peripheral edema.   ED Results / Procedures / Treatments   Labs (all labs ordered are listed, but only abnormal  results are displayed) Labs Reviewed  COMPREHENSIVE METABOLIC PANEL - Abnormal; Notable for the following components:      Result Value   Sodium 133 (*)    Potassium 3.2 (*)    Glucose, Bld 126 (*)    Calcium 8.6 (*)    All other components within normal limits  CBC WITH DIFFERENTIAL/PLATELET - Abnormal; Notable for the following components:   Hemoglobin 12.9 (*)    HCT 38.4 (*)    All other components within normal limits  BRAIN NATRIURETIC PEPTIDE - Abnormal; Notable for the following components:   B Natriuretic Peptide 222.0 (*)    All other components within normal limits  TROPONIN I (HIGH SENSITIVITY)     EKG  ED ECG REPORT I, Dionne Bucy, the attending physician, personally viewed and interpreted this ECG.  Date: 10/17/2022 EKG Time: 1004 Rate: 173 Rhythm: Atrial flutter QRS Axis: normal Intervals: normal ST/T Wave abnormalities: Nonspecific ST abnormalities Narrative Interpretation: no evidence of acute ischemia  ED ECG REPORT I, Dionne Bucy, the attending physician,  personally viewed and interpreted this ECG.  Date: 10/17/2022 EKG Time: 1027 Rate: 124 Rhythm: Ectopic atrial tachycardia QRS Axis: normal Intervals: Incomplete RBBB ST/T Wave abnormalities: normal Narrative Interpretation: no evidence of acute ischemia  ED ECG REPORT I, Dionne Bucy, the attending physician, personally viewed and interpreted this ECG.  Date: 10/17/2022 EKG Time: 1032 Rate: 72 Rhythm: normal sinus rhythm QRS Axis: normal Intervals: IVCD ST/T Wave abnormalities: normal Narrative Interpretation: no evidence of acute ischemia    RADIOLOGY    PROCEDURES:  Critical Care performed: Yes, see critical care procedure note(s)  .Critical Care  Performed by: Dionne Bucy, MD Authorized by: Dionne Bucy, MD   Critical care provider statement:    Critical care time (minutes):  30   Critical care time was exclusive of:  Separately  billable procedures and treating other patients   Critical care was necessary to treat or prevent imminent or life-threatening deterioration of the following conditions:  Cardiac failure   Critical care was time spent personally by me on the following activities:  Development of treatment plan with patient or surrogate, discussions with consultants, evaluation of patient's response to treatment, examination of patient, ordering and review of laboratory studies, ordering and review of radiographic studies, ordering and performing treatments and interventions, pulse oximetry, re-evaluation of patient's condition, review of old charts and interpretation of cardiac output measurements    MEDICATIONS ORDERED IN ED: Medications  diltiazem (CARDIZEM) injection 10 mg (10 mg Intravenous Not Given 10/17/22 1036)     IMPRESSION / MDM / ASSESSMENT AND PLAN / ED COURSE  I reviewed the triage vital signs and the nursing notes.  72 year old male with PMH as noted above presents with tachycardia to the 170s and initially with a low blood pressure when he was getting ready for an outpatient stress test.  He had not yet begun the stress test.  On reassessment in the ED his blood pressure is normal.  Exam is otherwise unremarkable.  EKG shows atrial flutter.  Differential diagnosis includes, but is not limited to, paroxysmal atrial fibrillation/flutter, other tachydysrhythmia.  The patient has no clinical findings of CHF.  I do not suspect ACS.  Patient's presentation is most consistent with acute complicated illness / injury requiring diagnostic workup.  The patient is on the cardiac monitor to evaluate for evidence of arrhythmia and/or significant heart rate changes.  The patient was given a dose of IV Cardizem.  He already took his p.o. Cardizem and other medications this morning.  His heart rate went down to the 70s in sinus rhythm.  We will obtain the lab workup, keep the patient on the monitor to assess for  recurrence, and reassess.  ----------------------------------------- 12:14 PM on 10/17/2022 -----------------------------------------  The patient has remained in sinus rhythm and asymptomatic for the last 2 hours.  Troponin is negative.  There is no indication for repeat as there is no clinical suspicion for ACS as an etiology or precipitant of this episode of atrial fibrillation.  BNP is minimally elevated.  The patient clinically does not have findings of CHF.  Electrolytes are unremarkable.  I consulted and discussed case with Dr. Juliann Pares from cardiology who agrees that the patient is stable for discharge with follow-up.  I counseled the patient on the results of the workup.  He feels well and is comfortable going home.  I gave him strict return precautions and he expressed understanding.   FINAL CLINICAL IMPRESSION(S) / ED DIAGNOSES   Final diagnoses:  Atrial fibrillation with rapid ventricular response (  HCC)     Rx / DC Orders   ED Discharge Orders     None        Note:  This document was prepared using Dragon voice recognition software and may include unintentional dictation errors.    Dionne Bucy, MD 10/17/22 1214

## 2022-10-17 NOTE — ED Notes (Signed)
Pt verbalizes understanding of discharge instructions. Opportunity for questioning and answers were provided. Pt discharged from ED to home with daughter.    

## 2022-10-17 NOTE — ED Notes (Addendum)
Pt via POV from cardiology office, states that he was going in for a stress test. Pt states he has been an anxious about this. At cardiology office, BP was 89/43 with a HR 173 afib RVR. Pt was sent over here. Denies any chest pain/SOB on arrival. Pt is on Eliquis and has been taking that as prescribed. Pt is A&Ox4 and NAD

## 2022-10-17 NOTE — ED Triage Notes (Signed)
Patient sent to ED from [facility] cardiology for tachycardia and hypotension. HR in the 170's BP in the 80's per card office. Pt denies CP.  22 L AC from cardiology office

## 2022-10-17 NOTE — Discharge Instructions (Signed)
Taking your normal medications as prescribed.  Follow-up with Dr. Juliann Pares.  Return to the ER immediately for new, worsening, or recurrent episodes of chest pain or pressure, difficulty breathing, palpitations, shortness of breath, feeling to pass out, a heart rate above 100-120, or any other new or worsening symptoms that concern you.

## 2022-10-17 NOTE — ED Notes (Signed)
Pt given urinal and re-instructed on use of call bell

## 2022-10-18 DIAGNOSIS — D6869 Other thrombophilia: Secondary | ICD-10-CM | POA: Diagnosis not present

## 2022-10-18 DIAGNOSIS — I484 Atypical atrial flutter: Secondary | ICD-10-CM | POA: Diagnosis not present

## 2022-10-18 DIAGNOSIS — I6522 Occlusion and stenosis of left carotid artery: Secondary | ICD-10-CM | POA: Diagnosis not present

## 2022-10-18 DIAGNOSIS — Z8673 Personal history of transient ischemic attack (TIA), and cerebral infarction without residual deficits: Secondary | ICD-10-CM | POA: Diagnosis not present

## 2022-10-18 DIAGNOSIS — R000 Tachycardia, unspecified: Secondary | ICD-10-CM | POA: Diagnosis not present

## 2022-10-18 DIAGNOSIS — R600 Localized edema: Secondary | ICD-10-CM | POA: Diagnosis not present

## 2022-10-18 DIAGNOSIS — E119 Type 2 diabetes mellitus without complications: Secondary | ICD-10-CM | POA: Diagnosis not present

## 2022-10-18 DIAGNOSIS — I4819 Other persistent atrial fibrillation: Secondary | ICD-10-CM | POA: Diagnosis not present

## 2022-10-18 DIAGNOSIS — R030 Elevated blood-pressure reading, without diagnosis of hypertension: Secondary | ICD-10-CM | POA: Diagnosis not present

## 2022-10-18 DIAGNOSIS — E7849 Other hyperlipidemia: Secondary | ICD-10-CM | POA: Diagnosis not present

## 2022-11-07 DIAGNOSIS — M2042 Other hammer toe(s) (acquired), left foot: Secondary | ICD-10-CM | POA: Diagnosis not present

## 2022-11-07 DIAGNOSIS — L84 Corns and callosities: Secondary | ICD-10-CM | POA: Diagnosis not present

## 2022-11-07 DIAGNOSIS — B351 Tinea unguium: Secondary | ICD-10-CM | POA: Diagnosis not present

## 2022-11-07 DIAGNOSIS — I890 Lymphedema, not elsewhere classified: Secondary | ICD-10-CM | POA: Diagnosis not present

## 2022-11-07 DIAGNOSIS — E119 Type 2 diabetes mellitus without complications: Secondary | ICD-10-CM | POA: Diagnosis not present

## 2022-11-07 DIAGNOSIS — M19072 Primary osteoarthritis, left ankle and foot: Secondary | ICD-10-CM | POA: Diagnosis not present

## 2022-11-07 DIAGNOSIS — M2041 Other hammer toe(s) (acquired), right foot: Secondary | ICD-10-CM | POA: Diagnosis not present

## 2022-11-07 DIAGNOSIS — M216X2 Other acquired deformities of left foot: Secondary | ICD-10-CM | POA: Diagnosis not present

## 2022-11-07 DIAGNOSIS — M216X1 Other acquired deformities of right foot: Secondary | ICD-10-CM | POA: Diagnosis not present

## 2022-11-08 DIAGNOSIS — R000 Tachycardia, unspecified: Secondary | ICD-10-CM | POA: Diagnosis not present

## 2022-11-08 DIAGNOSIS — D649 Anemia, unspecified: Secondary | ICD-10-CM | POA: Diagnosis not present

## 2022-11-08 DIAGNOSIS — R600 Localized edema: Secondary | ICD-10-CM | POA: Diagnosis not present

## 2022-11-08 DIAGNOSIS — Z8673 Personal history of transient ischemic attack (TIA), and cerebral infarction without residual deficits: Secondary | ICD-10-CM | POA: Diagnosis not present

## 2022-11-08 DIAGNOSIS — E7849 Other hyperlipidemia: Secondary | ICD-10-CM | POA: Diagnosis not present

## 2022-11-08 DIAGNOSIS — I4819 Other persistent atrial fibrillation: Secondary | ICD-10-CM | POA: Diagnosis not present

## 2022-11-08 DIAGNOSIS — Z8679 Personal history of other diseases of the circulatory system: Secondary | ICD-10-CM | POA: Diagnosis not present

## 2022-11-08 DIAGNOSIS — I10 Essential (primary) hypertension: Secondary | ICD-10-CM | POA: Diagnosis not present

## 2022-11-08 DIAGNOSIS — Z9889 Other specified postprocedural states: Secondary | ICD-10-CM | POA: Diagnosis not present

## 2022-11-08 DIAGNOSIS — I6522 Occlusion and stenosis of left carotid artery: Secondary | ICD-10-CM | POA: Diagnosis not present

## 2023-05-11 DIAGNOSIS — R600 Localized edema: Secondary | ICD-10-CM | POA: Diagnosis not present

## 2023-05-11 DIAGNOSIS — R000 Tachycardia, unspecified: Secondary | ICD-10-CM | POA: Diagnosis not present

## 2023-05-11 DIAGNOSIS — Z9889 Other specified postprocedural states: Secondary | ICD-10-CM | POA: Diagnosis not present

## 2023-05-11 DIAGNOSIS — I10 Essential (primary) hypertension: Secondary | ICD-10-CM | POA: Diagnosis not present

## 2023-05-11 DIAGNOSIS — E119 Type 2 diabetes mellitus without complications: Secondary | ICD-10-CM | POA: Diagnosis not present

## 2023-05-11 DIAGNOSIS — R011 Cardiac murmur, unspecified: Secondary | ICD-10-CM | POA: Diagnosis not present

## 2023-05-11 DIAGNOSIS — Z8679 Personal history of other diseases of the circulatory system: Secondary | ICD-10-CM | POA: Diagnosis not present

## 2023-05-11 DIAGNOSIS — Z8673 Personal history of transient ischemic attack (TIA), and cerebral infarction without residual deficits: Secondary | ICD-10-CM | POA: Diagnosis not present

## 2023-05-11 DIAGNOSIS — I484 Atypical atrial flutter: Secondary | ICD-10-CM | POA: Diagnosis not present

## 2023-05-14 ENCOUNTER — Telehealth: Payer: Self-pay | Admitting: Emergency Medicine

## 2023-05-14 NOTE — Telephone Encounter (Signed)
 Carly, RN left message for this patient to call back. He needs a follow up with Dr. Lalla Brothers in [place]. Next available is fine.

## 2023-06-05 NOTE — Progress Notes (Unsigned)
 Electrophysiology Office Follow up Visit Note:    Date:  06/06/2023   ID:  Timothy Wells, DOB 01-16-1951, MRN [id number]  PCP:  Luciana Axe, NP  [facility] Cardiologist:  None  [facility] Electrophysiologist:  Lanier Prude, MD    Interval History:     Timothy Wells is a 73 y.o. male who presents for a follow up visit.   I last saw the patient October 05, 2021 for atrial fibrillation.  He has had multiple prior catheter ablations for atrial fibrillation.  At the time of my last appointment with him he was on Eliquis and amiodarone.  He saw Dr. Juliann Pares May 11, 2023.  At that appointment his rhythm was well-controlled on atenolol and diltiazem.  The patient expressed a desire to avoid long-term exposure to anticoagulation and he is being referred back to discuss left atrial appendage occlusion.  He is doing well. He is off amiodarone for the past 6-8 months. No sustained recurrence of AF. Continues to take eliquis but wants to stop the medication given the excessive costs.      Past medical, surgical, social and family history were reviewed.  ROS:   Please see the history of present illness.    All other systems reviewed and are negative.  EKGs/Labs/Other Studies Reviewed:    The following studies were reviewed today:  Jun 27, 2021 CT cardiac personally reviewed    EKG Interpretation Date/Time:  Wednesday June 06 2023 14:32:52 EDT Ventricular Rate:  71 PR Interval:  184 QRS Duration:  116 QT Interval:  408 QTC Calculation: 443 R Axis:   70  Text Interpretation: Normal sinus rhythm Normal ECG Confirmed by Steffanie Dunn [phone]) on 06/06/2023 2:42:10 PM    Physical Exam:    VS:  BP 118/68   Pulse 71   Ht 5\' 7"  (1.702 m)   Wt 159 lb 12.8 oz (72.5 kg)   SpO2 98%   BMI 25.03 kg/m     Wt Readings from Last 3 Encounters:  06/06/23 159 lb 12.8 oz (72.5 kg)  10/17/22 167 lb 15.9 oz (76.2 kg)  01/29/22 168 lb (76.2 kg)     GEN: no  distress CARD: RRR, No MRG RESP: No IWOB. CTAB.      ASSESSMENT:    1. Persistent atrial fibrillation (HCC)   2. Atypical atrial flutter (HCC)   3. History of CVA (cerebrovascular accident)   4. Encounter for long-term (current) use of high-risk medication    PLAN:    In order of problems listed above:  #Persistent atrial fibrillation #Atrial flutter 2 prior catheter ablations.  Maintaining sinus rhythm.   Off amiodarone On Eliquis for stroke prophylaxis but prefers a stroke risk mitigation strategy that avoids long-term exposure to anticoagulation given fear of bleeding.  We discussed left atrial appendage during today's clinic appointment.  We discussed the procedure, its risks and likelihood of success.  He would like to proceed with the evaluation.  ------------------------  I have seen Timothy Wells in the office today who is being considered for a Watchman left atrial appendage closure device. I believe they will benefit from this procedure given their history of atrial fibrillation, CHA2DS2-VASc score of 5 and unadjusted ischemic stroke rate of 7.2% per year. Unfortunately, the patient is not felt to be a long term anticoagulation candidate secondary to fear of bleeding and excessive costs. The patient's chart has been reviewed and I feel that they would be a candidate for short term oral  anticoagulation after Watchman implant.   It is my belief that after undergoing a LAA closure procedure, Timothy Wells will not need long term anticoagulation which eliminates anticoagulation side effects and major bleeding risk.   Procedural risks for the Watchman implant have been reviewed with the patient including a 0.5% risk of stroke, <1% risk of perforation and <1% risk of device embolization. Other risks include bleeding, vascular damage, tamponade, worsening renal function, and death. The patient understands these risks.     The published clinical data on the safety and  effectiveness of WATCHMAN include but are not limited to the following: - Holmes DR, Everlene Farrier, Sick P et al. for the PROTECT AF Investigators. Percutaneous closure of the left atrial appendage versus warfarin therapy for prevention of stroke in patients with atrial fibrillation: a randomised non-inferiority trial. Lancet 2009; 374: 534-42. Everlene Farrier, Doshi SK, Isa Rankin D et al. on behalf of the PROTECT AF Investigators. Percutaneous Left Atrial Appendage Closure for Stroke Prophylaxis in Patients With Atrial Fibrillation 2.3-Year Follow-up of the PROTECT AF (Watchman Left Atrial Appendage System for Embolic Protection in Patients With Atrial Fibrillation) Trial. Circulation 2013; 127:720-729. - Alli O, Doshi S,  Kar S, Reddy VY, Sievert H et al. Quality of Life Assessment in the Randomized PROTECT AF (Percutaneous Closure of the Left Atrial Appendage Versus Warfarin Therapy for Prevention of Stroke in Patients With Atrial Fibrillation) Trial of Patients at Risk for Stroke With Nonvalvular Atrial Fibrillation. J Am Coll Cardiol 2013; 61:1790-8. Aline August DR, [place], Price M, Whisenant B, Sievert H, Doshi S, Huber K, Reddy V. Prospective randomized evaluation of the Watchman left atrial appendage Device in patients with atrial fibrillation versus long-term warfarin therapy; the PREVAIL trial. Journal of the [employer] of Cardiology, Vol. 4, No. 1, 2014, 1-11. - Kar S, Doshi SK, Sadhu A, Horton R, Osorio J et al. Primary outcome evaluation of a next-generation left atrial appendage closure device: results from the PINNACLE FLX trial. Circulation 2021;143(18)1754-1762.   The patient will need an updated echo if he opts to proceed. No CT scan will be needed.  HAS-BLED score 3 Hypertension Yes  Abnormal renal and liver function (Dialysis, transplant, Cr >2.26 mg/dL /Cirrhosis or Bilirubin >2x Normal or AST/ALT/AP >3x Normal) No  Stroke Yes  Bleeding No  Labile INR (Unstable/high INR) No   Elderly (>65) Yes  Drugs or alcohol (>= 8 drinks/week, anti-plt or NSAID) No   CHA2DS2-VASc Score = 5  The patient's score is based upon: CHF History: 0 HTN History: 1 Diabetes History: 1 Stroke History: 2 Vascular Disease History: 0 Age Score: 1 Gender Score: 0  #High risk med monitoring-amiodarone Update CMP, TSH free T4   Signed, Steffanie Dunn, MD, [facility], [facility] 06/06/2023 2:42 PM    Electrophysiology [facility]

## 2023-06-06 ENCOUNTER — Ambulatory Visit: Attending: Cardiology | Admitting: Cardiology

## 2023-06-06 ENCOUNTER — Encounter: Payer: Self-pay | Admitting: Cardiology

## 2023-06-06 VITALS — BP 118/68 | HR 71 | Ht 67.0 in | Wt 159.8 lb

## 2023-06-06 DIAGNOSIS — Z8673 Personal history of transient ischemic attack (TIA), and cerebral infarction without residual deficits: Secondary | ICD-10-CM | POA: Diagnosis not present

## 2023-06-06 DIAGNOSIS — I4819 Other persistent atrial fibrillation: Secondary | ICD-10-CM

## 2023-06-06 DIAGNOSIS — I484 Atypical atrial flutter: Secondary | ICD-10-CM

## 2023-06-06 DIAGNOSIS — Z79899 Other long term (current) drug therapy: Secondary | ICD-10-CM

## 2023-06-06 NOTE — Patient Instructions (Signed)
 Medication Instructions:  Your physician recommends that you continue on your current medications as directed. Please refer to the Current Medication list given to you today.  *If you need a refill on your cardiac medications before your next appointment, please call your pharmacy*  Testing/Procedures: Watchman Your physician has requested that you have Left atrial appendage (LAA) closure device implantation is a procedure to put a small device in the LAA of the heart. The LAA is a small sac in the wall of the heart's left upper chamber. Blood clots can form in this area. The device, Watchman closes the LAA to help prevent a blood clot and stroke.  Call Nurse Navigator, Orpha Bur if you would like to schedule at [phone].   Follow-Up: At [facility], you and your health needs are our priority.  As part of our continuing mission to provide you with exceptional heart care, our providers are all part of one team.  This team includes your primary Cardiologist (physician) and Advanced Practice Providers or APPs (Physician Assistants and Nurse Practitioners) who all work together to provide you with the care you need, when you need it.

## 2023-11-02 DIAGNOSIS — R030 Elevated blood-pressure reading, without diagnosis of hypertension: Secondary | ICD-10-CM | POA: Diagnosis not present

## 2023-11-02 DIAGNOSIS — N3281 Overactive bladder: Secondary | ICD-10-CM | POA: Diagnosis not present

## 2023-11-02 DIAGNOSIS — Z79899 Other long term (current) drug therapy: Secondary | ICD-10-CM | POA: Diagnosis not present

## 2023-11-02 DIAGNOSIS — D649 Anemia, unspecified: Secondary | ICD-10-CM | POA: Diagnosis not present

## 2023-11-02 DIAGNOSIS — R600 Localized edema: Secondary | ICD-10-CM | POA: Diagnosis not present

## 2023-11-02 DIAGNOSIS — E119 Type 2 diabetes mellitus without complications: Secondary | ICD-10-CM | POA: Diagnosis not present

## 2023-11-02 DIAGNOSIS — G2581 Restless legs syndrome: Secondary | ICD-10-CM | POA: Diagnosis not present

## 2023-11-02 DIAGNOSIS — Z125 Encounter for screening for malignant neoplasm of prostate: Secondary | ICD-10-CM | POA: Diagnosis not present

## 2023-11-02 DIAGNOSIS — R000 Tachycardia, unspecified: Secondary | ICD-10-CM | POA: Diagnosis not present

## 2023-11-02 DIAGNOSIS — R413 Other amnesia: Secondary | ICD-10-CM | POA: Diagnosis not present

## 2023-11-02 DIAGNOSIS — I6522 Occlusion and stenosis of left carotid artery: Secondary | ICD-10-CM | POA: Diagnosis not present

## 2023-11-02 DIAGNOSIS — N400 Enlarged prostate without lower urinary tract symptoms: Secondary | ICD-10-CM | POA: Diagnosis not present

## 2023-11-02 DIAGNOSIS — E559 Vitamin D deficiency, unspecified: Secondary | ICD-10-CM | POA: Diagnosis not present

## 2023-11-02 DIAGNOSIS — Z8673 Personal history of transient ischemic attack (TIA), and cerebral infarction without residual deficits: Secondary | ICD-10-CM | POA: Diagnosis not present

## 2023-11-02 DIAGNOSIS — I484 Atypical atrial flutter: Secondary | ICD-10-CM | POA: Diagnosis not present

## 2023-11-02 DIAGNOSIS — R7989 Other specified abnormal findings of blood chemistry: Secondary | ICD-10-CM | POA: Diagnosis not present

## 2023-11-02 DIAGNOSIS — D6869 Other thrombophilia: Secondary | ICD-10-CM | POA: Diagnosis not present

## 2023-11-02 DIAGNOSIS — E7849 Other hyperlipidemia: Secondary | ICD-10-CM | POA: Diagnosis not present

## 2023-11-02 DIAGNOSIS — Z0000 Encounter for general adult medical examination without abnormal findings: Secondary | ICD-10-CM | POA: Diagnosis not present

## 2023-11-02 DIAGNOSIS — Z1329 Encounter for screening for other suspected endocrine disorder: Secondary | ICD-10-CM | POA: Diagnosis not present

## 2023-11-02 DIAGNOSIS — R351 Nocturia: Secondary | ICD-10-CM | POA: Diagnosis not present

## 2023-11-02 DIAGNOSIS — I4819 Other persistent atrial fibrillation: Secondary | ICD-10-CM | POA: Diagnosis not present

## 2023-11-02 DIAGNOSIS — G4762 Sleep related leg cramps: Secondary | ICD-10-CM | POA: Diagnosis not present

## 2023-11-15 DIAGNOSIS — E119 Type 2 diabetes mellitus without complications: Secondary | ICD-10-CM | POA: Diagnosis not present

## 2023-11-15 DIAGNOSIS — R000 Tachycardia, unspecified: Secondary | ICD-10-CM | POA: Diagnosis not present

## 2023-11-15 DIAGNOSIS — I6522 Occlusion and stenosis of left carotid artery: Secondary | ICD-10-CM | POA: Diagnosis not present

## 2023-11-15 DIAGNOSIS — E7849 Other hyperlipidemia: Secondary | ICD-10-CM | POA: Diagnosis not present

## 2023-11-15 DIAGNOSIS — Z9889 Other specified postprocedural states: Secondary | ICD-10-CM | POA: Diagnosis not present

## 2023-11-15 DIAGNOSIS — I10 Essential (primary) hypertension: Secondary | ICD-10-CM | POA: Diagnosis not present

## 2023-11-15 DIAGNOSIS — Z8679 Personal history of other diseases of the circulatory system: Secondary | ICD-10-CM | POA: Diagnosis not present

## 2023-11-15 DIAGNOSIS — R600 Localized edema: Secondary | ICD-10-CM | POA: Diagnosis not present

## 2023-11-15 DIAGNOSIS — R011 Cardiac murmur, unspecified: Secondary | ICD-10-CM | POA: Diagnosis not present

## 2023-11-15 DIAGNOSIS — I484 Atypical atrial flutter: Secondary | ICD-10-CM | POA: Diagnosis not present

## 2023-11-15 DIAGNOSIS — K219 Gastro-esophageal reflux disease without esophagitis: Secondary | ICD-10-CM | POA: Diagnosis not present

## 2023-12-14 NOTE — Progress Notes (Signed)
 Timothy Wells                                          MRN: [id number]   12/14/2023   The VBCI Quality Team Specialist reviewed this patient medical record for the purposes of chart review for care gap closure. The following were reviewed: chart review for care gap closure-glycemic status assessment and kidney health evaluation for diabetes:eGFR  and uACR.    VBCI Quality Team

## 2024-01-08 ENCOUNTER — Other Ambulatory Visit: Payer: Self-pay

## 2024-01-08 ENCOUNTER — Inpatient Hospital Stay: Admission: EM | Admit: 2024-01-08 | Discharge: 2024-01-12 | DRG: 287 | Disposition: A | Discharge status: Home Health

## 2024-01-08 ENCOUNTER — Emergency Department

## 2024-01-08 DIAGNOSIS — R112 Nausea with vomiting, unspecified: Secondary | ICD-10-CM

## 2024-01-08 DIAGNOSIS — I4819 Other persistent atrial fibrillation: Secondary | ICD-10-CM | POA: Diagnosis present

## 2024-01-08 DIAGNOSIS — K219 Gastro-esophageal reflux disease without esophagitis: Secondary | ICD-10-CM | POA: Diagnosis present

## 2024-01-08 DIAGNOSIS — R008 Other abnormalities of heart beat: Secondary | ICD-10-CM | POA: Diagnosis not present

## 2024-01-08 DIAGNOSIS — I472 Ventricular tachycardia, unspecified: Secondary | ICD-10-CM | POA: Diagnosis not present

## 2024-01-08 DIAGNOSIS — Z7901 Long term (current) use of anticoagulants: Secondary | ICD-10-CM

## 2024-01-08 DIAGNOSIS — I4729 Other ventricular tachycardia: Secondary | ICD-10-CM | POA: Diagnosis not present

## 2024-01-08 DIAGNOSIS — R002 Palpitations: Secondary | ICD-10-CM

## 2024-01-08 DIAGNOSIS — I4510 Unspecified right bundle-branch block: Secondary | ICD-10-CM | POA: Diagnosis present

## 2024-01-08 DIAGNOSIS — E785 Hyperlipidemia, unspecified: Secondary | ICD-10-CM | POA: Diagnosis present

## 2024-01-08 DIAGNOSIS — E222 Syndrome of inappropriate secretion of antidiuretic hormone: Secondary | ICD-10-CM | POA: Diagnosis present

## 2024-01-08 DIAGNOSIS — D72829 Elevated white blood cell count, unspecified: Secondary | ICD-10-CM | POA: Diagnosis present

## 2024-01-08 DIAGNOSIS — Z96612 Presence of left artificial shoulder joint: Secondary | ICD-10-CM | POA: Diagnosis present

## 2024-01-08 DIAGNOSIS — G2581 Restless legs syndrome: Secondary | ICD-10-CM | POA: Diagnosis present

## 2024-01-08 DIAGNOSIS — Z85828 Personal history of other malignant neoplasm of skin: Secondary | ICD-10-CM

## 2024-01-08 DIAGNOSIS — R000 Tachycardia, unspecified: Secondary | ICD-10-CM | POA: Diagnosis not present

## 2024-01-08 DIAGNOSIS — E119 Type 2 diabetes mellitus without complications: Secondary | ICD-10-CM | POA: Diagnosis present

## 2024-01-08 DIAGNOSIS — I10 Essential (primary) hypertension: Secondary | ICD-10-CM | POA: Diagnosis present

## 2024-01-08 DIAGNOSIS — I872 Venous insufficiency (chronic) (peripheral): Secondary | ICD-10-CM | POA: Diagnosis present

## 2024-01-08 DIAGNOSIS — Z8673 Personal history of transient ischemic attack (TIA), and cerebral infarction without residual deficits: Secondary | ICD-10-CM

## 2024-01-08 DIAGNOSIS — I2489 Other forms of acute ischemic heart disease: Secondary | ICD-10-CM | POA: Diagnosis present

## 2024-01-08 DIAGNOSIS — N400 Enlarged prostate without lower urinary tract symptoms: Secondary | ICD-10-CM | POA: Diagnosis present

## 2024-01-08 DIAGNOSIS — R079 Chest pain, unspecified: Secondary | ICD-10-CM | POA: Diagnosis not present

## 2024-01-08 DIAGNOSIS — Z79899 Other long term (current) drug therapy: Secondary | ICD-10-CM

## 2024-01-08 LAB — URINALYSIS, ROUTINE W REFLEX MICROSCOPIC
Bilirubin Urine: NEGATIVE
Glucose, UA: 50 mg/dL — AB
Hgb urine dipstick: NEGATIVE
Ketones, ur: NEGATIVE mg/dL
Leukocytes,Ua: NEGATIVE
Nitrite: NEGATIVE
Protein, ur: NEGATIVE mg/dL
Specific Gravity, Urine: 1.008 (ref 1.005–1.030)
pH: 7 (ref 5.0–8.0)

## 2024-01-08 LAB — CBC
HCT: 37.2 % — ABNORMAL LOW (ref 39.0–52.0)
Hemoglobin: 13.1 g/dL (ref 13.0–17.0)
MCH: 32 pg (ref 26.0–34.0)
MCHC: 35.2 g/dL (ref 30.0–36.0)
MCV: 91 fL (ref 80.0–100.0)
Platelets: 259 K/uL (ref 150–400)
RBC: 4.09 MIL/uL — ABNORMAL LOW (ref 4.22–5.81)
RDW: 13 % (ref 11.5–15.5)
WBC: 10.9 K/uL — ABNORMAL HIGH (ref 4.0–10.5)
nRBC: 0 % (ref 0.0–0.2)

## 2024-01-08 LAB — LIPASE, BLOOD: Lipase: 23 U/L (ref 11–51)

## 2024-01-08 LAB — COMPREHENSIVE METABOLIC PANEL WITH GFR
ALT: 25 U/L (ref 0–44)
AST: 32 U/L (ref 15–41)
Albumin: 4.4 g/dL (ref 3.5–5.0)
Alkaline Phosphatase: 74 U/L (ref 38–126)
Anion gap: 11 (ref 5–15)
BUN: 18 mg/dL (ref 8–23)
CO2: 26 mmol/L (ref 22–32)
Calcium: 8.6 mg/dL — ABNORMAL LOW (ref 8.9–10.3)
Chloride: 91 mmol/L — ABNORMAL LOW (ref 98–111)
Creatinine, Ser: 0.63 mg/dL (ref 0.61–1.24)
GFR, Estimated: >60 mL/min (ref >60)
Glucose, Bld: 147 mg/dL — ABNORMAL HIGH (ref 70–99)
Potassium: 3.8 mmol/L (ref 3.5–5.1)
Sodium: 129 mmol/L — ABNORMAL LOW (ref 135–145)
Total Bilirubin: 0.4 mg/dL (ref 0.0–1.2)
Total Protein: 6.9 g/dL (ref 6.5–8.1)

## 2024-01-08 LAB — PRO BRAIN NATRIURETIC PEPTIDE: Pro Brain Natriuretic Peptide: 639 pg/mL — ABNORMAL HIGH (ref <300.0)

## 2024-01-08 LAB — TROPONIN T, HIGH SENSITIVITY: Troponin T High Sensitivity: 153 ng/L (ref 0–19)

## 2024-01-08 MED ORDER — AMIODARONE HCL IN DEXTROSE 360-4.14 MG/200ML-% IV SOLN
60.0000 mg/h | INTRAVENOUS | Status: AC
  Administered 2024-01-08 – 2024-01-09 (×2): 60 mg/h via INTRAVENOUS
  Filled 2024-01-08 (×2): qty 200

## 2024-01-08 MED ORDER — SODIUM CHLORIDE 0.9 % IV BOLUS
1000.0000 mL | Freq: Once | INTRAVENOUS | Status: AC
  Administered 2024-01-08: 1000 mL via INTRAVENOUS

## 2024-01-08 MED ORDER — DILTIAZEM HCL 25 MG/5ML IV SOLN
10.0000 mg | Freq: Once | INTRAVENOUS | Status: AC
  Administered 2024-01-08: 10 mg via INTRAVENOUS
  Filled 2024-01-08: qty 5

## 2024-01-08 MED ORDER — ASPIRIN 325 MG PO TABS
325.0000 mg | ORAL_TABLET | Freq: Once | ORAL | Status: AC
  Administered 2024-01-09: 325 mg via ORAL
  Filled 2024-01-08: qty 1

## 2024-01-08 MED ORDER — AMIODARONE LOAD VIA INFUSION
150.0000 mg | Freq: Once | INTRAVENOUS | Status: AC
  Administered 2024-01-08: 150 mg via INTRAVENOUS
  Filled 2024-01-08: qty 83.34

## 2024-01-08 MED ORDER — AMIODARONE HCL IN DEXTROSE 360-4.14 MG/200ML-% IV SOLN
30.0000 mg/h | INTRAVENOUS | Status: DC
  Administered 2024-01-09 – 2024-01-12 (×8): 30 mg/h via INTRAVENOUS
  Filled 2024-01-08 (×7): qty 200

## 2024-01-08 NOTE — ED Provider Notes (Signed)
 [facility] Provider Note    None    (approximate)   History   Emesis  Pt arrives POV, ambulatory to triage, gait steady, no acute distress noted c/o of feeling bad after eating this evening, vomiting 3 times pta. Denies pain.   HPI Timothy Wells is a 73 y.o. male PMH A-fib/flutter status post ablation on Eliquis , prior CVA, hypertension, T2DM presents for evaluation of vague chest discomfort and nausea - Patient states around 4 or 5:00 today he became nauseous and started having some vague chest discomfort.  Denies any chest pain.  No difficulty breathing. -    Reviewed last cardiology note from 11/15/2023.  Noted to have prior ablation for A-fib.  Plan to continue on Eliquis , atenolol, diltiazem .  Last echo in 2022, normal EF.  Grade 2 diastolic dysfunction.  No significant valvular pathology.     Physical Exam   Triage Vital Signs: ED Triage Vitals  Encounter Vitals Group     BP 01/08/24 2147 130/86     Girls Systolic BP Percentile --      Girls Diastolic BP Percentile --      Boys Systolic BP Percentile --      Boys Diastolic BP Percentile --      Pulse Rate 01/08/24 2147 68     Resp 01/08/24 2147 18     Temp 01/08/24 2147 (!) 97.1 F (36.2 C)     Temp Source 01/08/24 2147 Axillary     SpO2 01/08/24 2147 100 %     Weight --      Height --      Head Circumference --      Peak Flow --      Pain Score 01/08/24 2146 0     Pain Loc --      Pain Education --      Exclude from Growth Chart --     Most recent vital signs: Vitals:   01/08/24 2147  BP: 130/86  Pulse: 68  Resp: 18  Temp: (!) 97.1 F (36.2 C)  SpO2: 100%    {Only need to document appropriate and relevant physical exam:1} General: Awake, no distress. *** CV:  Good peripheral perfusion. RRR, RP 2+*** Resp:  Normal effort. CTAB*** Abd:  No distention. Nontender to deep palpation throughout*** Other:  ***   ED Results / Procedures / Treatments   Labs (all labs  ordered are listed, but only abnormal results are displayed) Labs Reviewed  COMPREHENSIVE METABOLIC PANEL WITH GFR - Abnormal; Notable for the following components:      Result Value   Sodium 129 (*)    Chloride 91 (*)    Glucose, Bld 147 (*)    Calcium  8.6 (*)    All other components within normal limits  CBC - Abnormal; Notable for the following components:   WBC 10.9 (*)    RBC 4.09 (*)    HCT 37.2 (*)    All other components within normal limits  URINALYSIS, ROUTINE W REFLEX MICROSCOPIC - Abnormal; Notable for the following components:   Color, Urine STRAW (*)    APPearance CLEAR (*)    Glucose, UA 50 (*)    All other components within normal limits  LIPASE, BLOOD  TROPONIN T, HIGH SENSITIVITY     EKG  ***   RADIOLOGY *** {USE THE WORD INTERPRETED!! You MUST document your own interpretation of imaging, as well as the fact that you reviewed the radiologist's report!:1}   PROCEDURES:  Critical Care performed: {CriticalCareYesNo:19197::Yes, see critical care procedure note(s),No}  Procedures   MEDICATIONS ORDERED IN ED: Medications - No data to display   IMPRESSION / MDM / ASSESSMENT AND PLAN / ED COURSE  I reviewed the triage vital signs and the nursing notes.                              DDX/MDM/AP: Differential diagnosis includes, but is not limited to, ***  Plan: - ***  Patient's presentation is most consistent with {EM COPA:27473}  ***The patient is on the cardiac monitor to evaluate for evidence of arrhythmia and/or significant heart rate changes.***  ED course below. ***  Clinical Course as of 01/08/24 2313  Tue Jan 08, 2024  2230 CMP with moderate hyponatremia to 129, otherwise unremarkable  CBC with mild leukocytosis, no anemia  Urinalysis no evidence infection  Lipase normal [MM]  2233 Paging cardiology to discuss [MM]  2238 D/w Dr. Ammon of cardiology - Agrees with concern for possible VT or other wide-complex tachycardia,  does agree with amiodarone  loading and infusion [MM]  2250 Patient updated on plan, currently A-fib RVR in the 110s to 120s, remains normotensive   paging ICU [MM]  2311 Case discussed with NP Tukov-Yual with ICU team. Requests discussion with hospitalist team for potential admission to step-down as the patient is hemodynamically stable, but if they feel patient needs admission by intensivist team to reach back out to her. [NR]    Clinical Course User Index [MM] Clarine Ozell LABOR, MD [NR] Levander Slate, MD     FINAL CLINICAL IMPRESSION(S) / ED DIAGNOSES   Final diagnoses:  None     Rx / DC Orders   ED Discharge Orders     None        Note:  This document was prepared using Dragon voice recognition software and may include unintentional dictation errors.

## 2024-01-08 NOTE — ED Notes (Addendum)
 Pts daughter arrives at nursing station and reports she was concerned her father was having a heart attack earlier bc he reported to her his body got tight and he became dizzy, nauseated, and vomited; pt denied cp, dizziness, or sob during triage. Pt brought back for EKG and HR 191, pt transferred to room at this time.

## 2024-01-08 NOTE — ED Notes (Signed)
Rainbow sent to the lab at this time.  

## 2024-01-08 NOTE — ED Provider Notes (Signed)
 Care of this patient assumed from prior physician at 2300 pending anticipated admission. Please see prior physician note for further details.  Briefly this is a 73 year old male who presented with extreme tachycardia found to have wide-complex tachycardia at a rate of 190, self converted to A-fib with RVR, now on amio drip.   Clinical Course as of 01/09/24 0037  Tue Jan 08, 2024  2230 CMP with moderate hyponatremia to 129, otherwise unremarkable  CBC with mild leukocytosis, no anemia  Urinalysis no evidence infection  Lipase normal [MM]  2233 Paging cardiology to discuss [MM]  2238 D/w Dr. Ammon of cardiology - Agrees with concern for possible VT or other wide-complex tachycardia, does agree with amiodarone  loading and infusion [MM]  2250 Patient updated on plan, currently A-fib RVR in the 110s to 120s, remains normotensive   paging ICU [MM]  2311 Case discussed with NP Tukov-Yual with ICU team. Requests discussion with hospitalist team for potential admission to step-down as the patient is hemodynamically stable, but if they feel patient needs admission by intensivist team to reach back out to her. [NR]  2312 Hospitalist team paged [NR]  2338 Troponin T High Sensitivity(!!): 153 Troponin elevated at 153, will order ASA. On Eliquis , if troponin with significant uptrend may benefit from heparin  drip.  Had 12 beat run of V. tach on amiodarone  drip, self resolved.  Patient assessed at bedside.  Denies chest pain, shortness of breath, has not had any further episodes of weakness similar to how he was feeling when he first came in since he has been here.  Agrees with plan for admission.  [NR]  Wed Jan 09, 2024  9965 Case discussed with Dr. Cleatus.  She will evaluate for anticipate admission. [NR]    Clinical Course User Index [MM] Clarine Ozell LABOR, MD [NR] Levander Slate, MD      Levander Slate, MD 01/09/24 [id number]

## 2024-01-08 NOTE — ED Triage Notes (Signed)
 Pt arrives POV, ambulatory to triage, gait steady, no acute distress noted c/o of feeling bad after eating this evening, vomiting 3 times pta. Denies pain.

## 2024-01-08 NOTE — ED Provider Notes (Incomplete)
 Care of this patient assumed from prior physician at 2300 pending anticipated admission. Please see prior physician note for further details.  Briefly this is a 73 year old male who presented with extreme tachycardia found to have wide-complex tachycardia at a rate of 190,

## 2024-01-09 ENCOUNTER — Encounter: Payer: Self-pay | Admitting: Internal Medicine

## 2024-01-09 ENCOUNTER — Inpatient Hospital Stay
Admit: 2024-01-09 | Discharge: 2024-01-09 | Disposition: A | Discharge status: Home / Self Care | Attending: Internal Medicine | Admitting: Internal Medicine

## 2024-01-09 ENCOUNTER — Inpatient Hospital Stay

## 2024-01-09 DIAGNOSIS — I10 Essential (primary) hypertension: Secondary | ICD-10-CM | POA: Diagnosis not present

## 2024-01-09 DIAGNOSIS — K219 Gastro-esophageal reflux disease without esophagitis: Secondary | ICD-10-CM | POA: Diagnosis not present

## 2024-01-09 DIAGNOSIS — I2489 Other forms of acute ischemic heart disease: Secondary | ICD-10-CM | POA: Diagnosis not present

## 2024-01-09 DIAGNOSIS — I872 Venous insufficiency (chronic) (peripheral): Secondary | ICD-10-CM | POA: Insufficient documentation

## 2024-01-09 DIAGNOSIS — I472 Ventricular tachycardia, unspecified: Secondary | ICD-10-CM | POA: Diagnosis not present

## 2024-01-09 DIAGNOSIS — E785 Hyperlipidemia, unspecified: Secondary | ICD-10-CM | POA: Diagnosis not present

## 2024-01-09 DIAGNOSIS — Z7901 Long term (current) use of anticoagulants: Secondary | ICD-10-CM | POA: Diagnosis not present

## 2024-01-09 DIAGNOSIS — I4510 Unspecified right bundle-branch block: Secondary | ICD-10-CM | POA: Diagnosis not present

## 2024-01-09 DIAGNOSIS — Z85828 Personal history of other malignant neoplasm of skin: Secondary | ICD-10-CM | POA: Diagnosis not present

## 2024-01-09 DIAGNOSIS — R000 Tachycardia, unspecified: Secondary | ICD-10-CM

## 2024-01-09 DIAGNOSIS — I4819 Other persistent atrial fibrillation: Secondary | ICD-10-CM | POA: Diagnosis not present

## 2024-01-09 DIAGNOSIS — Z8673 Personal history of transient ischemic attack (TIA), and cerebral infarction without residual deficits: Secondary | ICD-10-CM | POA: Diagnosis not present

## 2024-01-09 DIAGNOSIS — R112 Nausea with vomiting, unspecified: Secondary | ICD-10-CM | POA: Diagnosis not present

## 2024-01-09 DIAGNOSIS — D72829 Elevated white blood cell count, unspecified: Secondary | ICD-10-CM | POA: Diagnosis not present

## 2024-01-09 DIAGNOSIS — Z96612 Presence of left artificial shoulder joint: Secondary | ICD-10-CM | POA: Diagnosis not present

## 2024-01-09 DIAGNOSIS — G2581 Restless legs syndrome: Secondary | ICD-10-CM | POA: Diagnosis not present

## 2024-01-09 DIAGNOSIS — R002 Palpitations: Secondary | ICD-10-CM | POA: Diagnosis not present

## 2024-01-09 DIAGNOSIS — Z79899 Other long term (current) drug therapy: Secondary | ICD-10-CM | POA: Diagnosis not present

## 2024-01-09 DIAGNOSIS — E119 Type 2 diabetes mellitus without complications: Secondary | ICD-10-CM | POA: Diagnosis not present

## 2024-01-09 DIAGNOSIS — E222 Syndrome of inappropriate secretion of antidiuretic hormone: Secondary | ICD-10-CM | POA: Diagnosis not present

## 2024-01-09 DIAGNOSIS — N400 Enlarged prostate without lower urinary tract symptoms: Secondary | ICD-10-CM | POA: Diagnosis not present

## 2024-01-09 LAB — GLUCOSE, CAPILLARY: Glucose-Capillary: 101 mg/dL — ABNORMAL HIGH (ref 70–99)

## 2024-01-09 LAB — MRSA NEXT GEN BY PCR, NASAL: MRSA by PCR Next Gen: DETECTED — AB

## 2024-01-09 LAB — TROPONIN T, HIGH SENSITIVITY: Troponin T High Sensitivity: 218 ng/L (ref 0–19)

## 2024-01-09 LAB — BASIC METABOLIC PANEL WITH GFR
Anion gap: 8 (ref 5–15)
BUN: 8 mg/dL (ref 8–23)
CO2: 24 mmol/L (ref 22–32)
Calcium: 8.4 mg/dL — ABNORMAL LOW (ref 8.9–10.3)
Chloride: 98 mmol/L (ref 98–111)
Creatinine, Ser: 0.55 mg/dL — ABNORMAL LOW (ref 0.61–1.24)
GFR, Estimated: >60 mL/min (ref >60)
Glucose, Bld: 102 mg/dL — ABNORMAL HIGH (ref 70–99)
Potassium: 4 mmol/L (ref 3.5–5.1)
Sodium: 130 mmol/L — ABNORMAL LOW (ref 135–145)

## 2024-01-09 LAB — MAGNESIUM: Magnesium: 1.8 mg/dL (ref 1.7–2.4)

## 2024-01-09 MED ORDER — ASPIRIN 81 MG PO CHEW
81.0000 mg | CHEWABLE_TABLET | ORAL | Status: AC
  Administered 2024-01-10: 81 mg via ORAL
  Filled 2024-01-09: qty 1

## 2024-01-09 MED ORDER — TAMSULOSIN HCL 0.4 MG PO CAPS
0.4000 mg | ORAL_CAPSULE | Freq: Every day | ORAL | Status: DC
  Administered 2024-01-09 – 2024-01-11 (×2): 0.4 mg via ORAL
  Filled 2024-01-09 (×2): qty 1

## 2024-01-09 MED ORDER — MIRABEGRON ER 25 MG PO TB24
25.0000 mg | ORAL_TABLET | Freq: Every day | ORAL | Status: DC
  Administered 2024-01-09 – 2024-01-12 (×4): 25 mg via ORAL
  Filled 2024-01-09 (×4): qty 1

## 2024-01-09 MED ORDER — METOPROLOL TARTRATE 25 MG PO TABS
25.0000 mg | ORAL_TABLET | Freq: Four times a day (QID) | ORAL | Status: DC
  Administered 2024-01-09 – 2024-01-10 (×3): 25 mg via ORAL
  Filled 2024-01-09 (×3): qty 1

## 2024-01-09 MED ORDER — POTASSIUM CHLORIDE CRYS ER 20 MEQ PO TBCR
40.0000 meq | EXTENDED_RELEASE_TABLET | Freq: Once | ORAL | Status: AC
  Administered 2024-01-09: 40 meq via ORAL
  Filled 2024-01-09: qty 2

## 2024-01-09 MED ORDER — ATORVASTATIN CALCIUM 80 MG PO TABS
80.0000 mg | ORAL_TABLET | Freq: Every evening | ORAL | Status: DC
  Administered 2024-01-09 – 2024-01-11 (×3): 80 mg via ORAL
  Filled 2024-01-09 (×3): qty 1

## 2024-01-09 MED ORDER — ALPRAZOLAM 0.25 MG PO TABS
0.2500 mg | ORAL_TABLET | Freq: Two times a day (BID) | ORAL | Status: DC | PRN
  Administered 2024-01-10: 0.25 mg via ORAL
  Filled 2024-01-09: qty 1

## 2024-01-09 MED ORDER — ONDANSETRON HCL 4 MG/2ML IJ SOLN
4.0000 mg | Freq: Four times a day (QID) | INTRAMUSCULAR | Status: DC | PRN
Start: 2024-01-09 — End: 2024-01-11
  Administered 2024-01-10: 4 mg via INTRAVENOUS
  Filled 2024-01-09: qty 2

## 2024-01-09 MED ORDER — AMIODARONE LOAD VIA INFUSION
150.0000 mg | Freq: Once | INTRAVENOUS | Status: AC
  Administered 2024-01-09: 150 mg via INTRAVENOUS
  Filled 2024-01-09: qty 83.34

## 2024-01-09 MED ORDER — ACETAMINOPHEN 325 MG PO TABS: 650.0000 mg | ORAL_TABLET | ORAL | Status: DC | PRN

## 2024-01-09 MED ORDER — PROMETHAZINE HCL 25 MG/ML IJ SOLN: 12.5000 mg | Freq: Four times a day (QID) | INTRAMUSCULAR | Status: DC | PRN

## 2024-01-09 MED ORDER — FREE WATER
500.0000 mL | Freq: Once | Status: AC
  Administered 2024-01-10: 500 mL via ORAL

## 2024-01-09 MED ORDER — APIXABAN 5 MG PO TABS
5.0000 mg | ORAL_TABLET | Freq: Two times a day (BID) | ORAL | Status: DC
  Administered 2024-01-09 (×2): 5 mg via ORAL
  Filled 2024-01-09 (×2): qty 1

## 2024-01-09 MED ORDER — MAGNESIUM OXIDE 400 MG PO TABS
400.0000 mg | ORAL_TABLET | Freq: Every day | ORAL | Status: DC
  Administered 2024-01-09 – 2024-01-12 (×4): 400 mg via ORAL
  Filled 2024-01-09 (×7): qty 1

## 2024-01-09 MED ORDER — CHLORHEXIDINE GLUCONATE CLOTH 2 % EX PADS
6.0000 | MEDICATED_PAD | Freq: Every day | CUTANEOUS | Status: DC
  Administered 2024-01-09 – 2024-01-11 (×3): 6 via TOPICAL

## 2024-01-09 MED ORDER — ORAL CARE MOUTH RINSE: 15.0000 mL | OROMUCOSAL | Status: DC | PRN

## 2024-01-09 NOTE — Assessment & Plan Note (Signed)
 No CHF history No acute issues

## 2024-01-09 NOTE — Assessment & Plan Note (Signed)
 Sliding scale insulin  coverage

## 2024-01-09 NOTE — Hospital Course (Signed)
 Timothy Wells

## 2024-01-09 NOTE — Assessment & Plan Note (Signed)
Continue statin and antiplatelet.

## 2024-01-09 NOTE — ED Notes (Signed)
 Introduced self to pt. Pt alert and oriented x4. Pt denies pain at this time. Pt has call bell in reach. Pt has no further needs at this time.

## 2024-01-09 NOTE — Assessment & Plan Note (Signed)
Continue Myrbetriq 

## 2024-01-09 NOTE — H&P (Signed)
 History and Physical    Patient: Timothy Wells:[id number] DOB: 02-Jan-1951 DOA: 01/08/2024 DOS: the patient was seen and examined on 01/09/2024 PCP: Steva Clotilda DEL, NP  Patient coming from: Home  Chief Complaint:  Chief Complaint  Patient presents with   Emesis    HPI: Timothy Wells is a 73 y.o. male with medical history significant for Atrial flutter s/p ablation on Eliquis , previously on amiodarone , prior CVA, HTN,  venous insufficiency, last seen by cardiology in September 2025 being admitted with sustained V. tach currently on amiodarone  with which he converted to A-fib.  He initially presented with nausea, fatigue and vomiting starting after dinner.  While in triage he had an episode of dizziness and vomiting whereupon EKG revealed wide-complex tachycardia at 191.  BP remained stable in the 1 teens over 80s.  While on amiodarone  in the ED he had another 12 beat run of V. tach but currently remains in A-fib.  He denies chest pain or shortness of breath and feeling better with treatment in the ED Labs were notable for troponin 153, proBNP 639, sodium 129, potassium 3.8, magnesium  pending WBC 10.9, UA unremarkable. EKG postconversion showing A-fib with RBBB at 111. Chest x-ray nonacute  Patient also given a NS bolus and aspirin  Admission requested     Past Medical History:  Diagnosis Date   A-fib [facility])    Dysrhythmia    wide complex tachycardia   Edema of both lower legs    GERD (gastroesophageal reflux disease)    Memory loss    Restless leg syndrome    Skin cancer    skin cancer face basal cell   Stroke (HCC) 2019   Type 2 diabetes mellitus without complication, without long-term current use of insulin (HCC) 04/10/2019   Past Surgical History:  Procedure Laterality Date   ATRIAL FIBRILLATION ABLATION N/A 07/23/2020   Procedure: ATRIAL FIBRILLATION ABLATION;  Surgeon: Cindie Ole DASEN, MD;  Location: [facility] INVASIVE CV LAB;  Service: Cardiovascular;  Laterality:  N/A;   ATRIAL FIBRILLATION ABLATION N/A 06/30/2021   Procedure: ATRIAL FIBRILLATION ABLATION;  Surgeon: Cindie Ole DASEN, MD;  Location: [facility] INVASIVE CV LAB;  Service: Cardiovascular;  Laterality: N/A;   CARDIOVERSION N/A 03/25/2019   Procedure: CARDIOVERSION;  Surgeon: Florencio Cara BIRCH, MD;  Location: [facility] ORS;  Service: Cardiovascular;  Laterality: N/A;   CARDIOVERSION N/A 05/18/2020   Procedure: CARDIOVERSION;  Surgeon: Florencio Cara BIRCH, MD;  Location: [facility] ORS;  Service: Cardiovascular;  Laterality: N/A;   CARDIOVERSION N/A 01/24/2021   Procedure: CARDIOVERSION;  Surgeon: Darron Deatrice LABOR, MD;  Location: [facility] ORS;  Service: Cardiovascular;  Laterality: N/A;   COLONOSCOPY WITH PROPOFOL  N/A 09/10/2019   Procedure: COLONOSCOPY WITH PROPOFOL ;  Surgeon: Dessa Reyes LELON, MD;  Location: [facility] ENDOSCOPY;  Service: Endoscopy;  Laterality: N/A;   REVERSE SHOULDER ARTHROPLASTY Left 12/29/2021   Procedure: REVERSE SHOULDER ARTHROPLASTY;  Surgeon: Melita Drivers, MD;  Location: [facility] ORS;  Service: Orthopedics;  Laterality: Left;    SHOULDER ARTHROSCOPY WITH SUBACROMIAL DECOMPRESSION AND OPEN ROTATOR C Left 03/08/2020   Procedure: Left shoulder arthroscopic subscapularis repair and biodegradable balloon spacer placement with partial infraspinatus repair;  Surgeon: Tobie Priest, MD;  Location: [facility];  Service: Orthopedics;  Laterality: Left;   TEE WITHOUT CARDIOVERSION N/A 07/23/2020   Procedure: TRANSESOPHAGEAL ECHOCARDIOGRAM (TEE);  Surgeon: Jeffrie Oneil BROCKS, MD;  Location: [facility] ENDOSCOPY;  Service: Cardiovascular;  Laterality: N/A;   Social History:  reports that he has never smoked. He has never used  smokeless tobacco. He reports current alcohol  use. He reports that he does not use drugs.  No Known Allergies  No family history on file.  Prior to Admission medications   Medication Sig Start Date End Date Taking? Authorizing Provider  MYRBETRIQ 25 MG TB24 tablet Take 25 mg by mouth  daily. 10/22/23  Yes [provider]  acetaminophen  (TYLENOL ) 500 MG tablet Take 1,000 mg by mouth every 6 (six) hours as needed for mild pain or headache.    [provider]  amiodarone  (PACERONE ) 100 MG tablet Take 100 mg by mouth daily. 12/08/21 12/08/22  [provider]  apixaban  (ELIQUIS ) 5 MG TABS tablet Take 1 tablet by mouth twice daily 01/12/22   Cindie Ole DASEN, MD  atenolol (TENORMIN) 25 MG tablet Take 25 mg by mouth daily. 12/08/21   [provider]  atorvastatin  (LIPITOR ) 80 MG tablet Take 80 mg by mouth every evening.  07/02/17   [provider]  Cholecalciferol (VITAMIN D) 50 MCG (2000 UT) CAPS Take 4,000 Units by mouth daily.    [provider]  Chromium-Cinnamon (CINNAMON PLUS CHROMIUM PO) Take 1 tablet by mouth 2 (two) times daily. 500 mg / 100 mg    [provider]  Cranberry 500 MG CAPS Take 500 mg by mouth daily.    [provider]  cyclobenzaprine  (FLEXERIL ) 10 MG tablet Take 1 tablet (10 mg total) by mouth 3 (three) times daily as needed for muscle spasms. 12/29/21   Shuford, Randine, PA-C  diltiazem  (CARDIZEM  CD) 360 MG 24 hr capsule Take 1 capsule by mouth once daily 08/22/21   Cindie Ole DASEN, MD  docusate sodium (COLACE) 100 MG capsule Take 100 mg by mouth 2 (two) times daily.    [provider]  doxazosin (CARDURA) 1 MG tablet Take 1 mg by mouth at bedtime. 06/23/21   [provider]  furosemide (LASIX) 20 MG tablet Take 20 mg by mouth daily as needed for edema.    [provider]  Glucosamine-Chondroitin (COSAMIN DS PO) Take 1 tablet by mouth 2 (two) times daily.    [provider]  lisinopril (ZESTRIL) 10 MG tablet Take 10 mg by mouth daily. 02/06/20   [provider]  magnesium  oxide (MAG-OX) 400 (241.3 Mg) MG tablet Take 1 tablet (400 mg total) by mouth daily. 01/23/19   Patel, Sona, MD  Multiple Vitamin (MULTIVITAMIN WITH MINERALS) TABS tablet Take 1  tablet by mouth daily.    [provider]  Omega-3 Fatty Acids (FISH OIL) 1000 MG CAPS Take 1,000 mg by mouth daily. DHA    [provider]  ondansetron  (ZOFRAN ) 4 MG tablet Take 1 tablet (4 mg total) by mouth every 8 (eight) hours as needed for nausea or vomiting. 12/29/21   Shuford, Randine, PA-C  OVER THE COUNTER MEDICATION Take 1 Scoop by mouth daily. Super Beets power    [provider]  oxyCODONE -acetaminophen  (PERCOCET) 5-325 MG tablet Take 1 tablet by mouth every 4 (four) hours as needed (max 6 q). 12/29/21   Shuford, Randine, PA-C  rOPINIRole (REQUIP) 1 MG tablet Take 1 mg by mouth at bedtime.    [provider]  tamsulosin (FLOMAX) 0.4 MG CAPS capsule Take 0.4 mg by mouth daily after supper. 10/15/19   [provider]  TURMERIC PO Take 1,500 mg by mouth daily.    [provider]  vitamin B-12 (CYANOCOBALAMIN ) 1000 MCG tablet Take 1,000 mcg by mouth daily.    [provider]  vitamin C (ASCORBIC ACID) 500 MG tablet Take 500 mg by mouth daily.    [provider]  Vitamin E 450 MG (1000 UT) CAPS Take 1,000 Units by mouth daily.    [provider]  Zinc 50 MG TABS Take 50 mg by mouth daily.    [provider]    Physical Exam: Vitals:   01/08/24 2300 01/08/24 2315 01/09/24 0000 01/09/24 0030  BP: 110/84 109/83 118/77 109/83  Pulse: (!) 106 97 99 96  Resp: 13 18 17 15   Temp:      TempSrc:      SpO2:   100% 98%   Physical Exam Vitals and nursing note reviewed.  Constitutional:      General: He is not in acute distress. HENT:     Head: Normocephalic and atraumatic.  Cardiovascular:     Rate and Rhythm: Tachycardia present. Rhythm irregular.     Heart sounds: Normal heart sounds.  Pulmonary:     Effort: Pulmonary effort is normal.     Breath sounds: Normal breath sounds.  Abdominal:     Palpations: Abdomen is soft.     Tenderness: There is no abdominal tenderness.  Neurological:     Mental  Status: Mental status is at baseline.     Labs on Admission: I have personally reviewed following labs and imaging studies  CBC: Recent Labs  Lab 01/08/24 2148  WBC 10.9*  HGB 13.1  HCT 37.2*  MCV 91.0  PLT 259   Basic Metabolic Panel: Recent Labs  Lab 01/08/24 2148  NA 129*  K 3.8  CL 91*  CO2 26  GLUCOSE 147*  BUN 18  CREATININE 0.63  CALCIUM  8.6*   GFR: CrCl cannot be calculated (Unknown ideal weight.). Liver Function Tests: Recent Labs  Lab 01/08/24 2148  AST 32  ALT 25  ALKPHOS 74  BILITOT 0.4  PROT 6.9  ALBUMIN 4.4   Recent Labs  Lab 01/08/24 2148  LIPASE 23   No results for input(s): AMMONIA in the last 168 hours. Coagulation Profile: No results for input(s): INR, PROTIME in the last 168 hours. Cardiac Enzymes: No results for input(s): CKTOTAL, CKMB, CKMBINDEX, TROPONINI in the last 168 hours. BNP (last 3 results) Recent Labs    01/08/24 2148  PROBNP 639.0*   HbA1C: No results for input(s): HGBA1C in the last 72 hours. CBG: No results for input(s): GLUCAP in the last 168 hours. Lipid Profile: No results for input(s): CHOL, HDL, LDLCALC, TRIG, CHOLHDL, LDLDIRECT in the last 72 hours. Thyroid  Function Tests: No results for input(s): TSH, T4TOTAL, FREET4, T3FREE, THYROIDAB in the last 72 hours. Anemia Panel: No results for input(s): VITAMINB12, FOLATE, FERRITIN, TIBC, IRON, RETICCTPCT in the last 72 hours. Urine analysis:    Component Value Date/Time   COLORURINE STRAW (A) 01/08/2024 2148   APPEARANCEUR CLEAR (A) 01/08/2024 2148   LABSPEC 1.008 01/08/2024 2148   PHURINE 7.0 01/08/2024 2148   GLUCOSEU 50 (A) 01/08/2024 2148   HGBUR NEGATIVE 01/08/2024 2148   BILIRUBINUR NEGATIVE 01/08/2024 2148   KETONESUR NEGATIVE 01/08/2024 2148   PROTEINUR NEGATIVE 01/08/2024 2148   NITRITE NEGATIVE 01/08/2024 2148   LEUKOCYTESUR NEGATIVE 01/08/2024 2148    Radiological Exams on Admission: DG  Chest Portable 1 View Result Date: 01/08/2024 EXAM: 1 VIEW(S) XRAY OF THE CHEST 01/08/2024 10:43:00 PM COMPARISON: Comparison study 01/02/2021. CLINICAL HISTORY: chest pain FINDINGS: LUNGS AND PLEURA: The lungs are clear bilaterally. No pleural effusion. No pneumothorax. HEART AND MEDIASTINUM: The cardiac shadow is  stable. BONES AND SOFT TISSUES: Left shoulder replacement is noted. No acute osseous abnormality. IMPRESSION: 1. No acute cardiopulmonary pathology. Electronically signed by: Oneil Devonshire MD 01/08/2024 10:51 PM EST RP Workstation: [id number]   Data Reviewed for HPI: Relevant notes from primary care and specialist visits, past discharge summaries as available in EHR, including Care Everywhere. Prior diagnostic testing as pertinent to current admission diagnoses Updated medications and problem lists for reconciliation ED course, including vitals, labs, imaging, treatment and response to treatment Triage notes, nursing and pharmacy notes and ED provider's notes Notable results as noted above in HPI      Assessment and Plan: * Wide-complex tachycardia Ventricular tachycardia versus A-fib with aberrancy Atrial fibrillation with RVR with history of ablation Patient converted to A-fib with RVR with amiodarone  Continue amiodarone  infusion for rate control Hold home Cardizem  and atenolol for now Continue apixaban  for secondary stroke prevention Monitor potassium and correct above 4, monitor magnesium  and correct above 2 Will get echocardiogram Cardiology consulted  Essential hypertension Will hold lisinopril and resume blood pressure lowering  History of CVA (cerebrovascular accident) Continue statin and antiplatelet  Venous insufficiency No CHF history No acute issues  Benign prostatic hyperplasia without lower urinary tract symptoms Continue Myrbetriq        DVT prophylaxis: Apixaban   Consults: Case cardiology  Advance Care Planning:   Code Status: Prior    Family Communication: none  Disposition Plan: Back to previous home environment  Severity of Illness: The appropriate patient status for this patient is INPATIENT. Inpatient status is judged to be reasonable and necessary in order to provide the required intensity of service to ensure the patient's safety. The patient's presenting symptoms, physical exam findings, and initial radiographic and laboratory data in the context of their chronic comorbidities is felt to place them at high risk for further clinical deterioration. Furthermore, it is not anticipated that the patient will be medically stable for discharge from the hospital within 2 midnights of admission.   * I certify that at the point of admission it is my clinical judgment that the patient will require inpatient hospital care spanning beyond 2 midnights from the point of admission due to high intensity of service, high risk for further deterioration and high frequency of surveillance required.*  Author: Delayne LULLA Solian, MD 01/09/2024 12:46 AM  For on call review [email or web address].

## 2024-01-09 NOTE — Assessment & Plan Note (Addendum)
 Ventricular tachycardia versus A-fib with aberrancy Atrial fibrillation with RVR with history of ablation Patient converted to A-fib with RVR with amiodarone  Continue amiodarone  infusion for rate control Hold home Cardizem  and atenolol for now Continue apixaban  for secondary stroke prevention Monitor potassium and correct above 4, monitor magnesium  and correct above 2 Will get echocardiogram Cardiology consulted

## 2024-01-09 NOTE — ED Notes (Signed)
 Report given to ICU RN

## 2024-01-09 NOTE — ED Notes (Signed)
 Attempted report. RN unavailable at this. Left name and number for call back.

## 2024-01-09 NOTE — Assessment & Plan Note (Signed)
 Will hold lisinopril and resume blood pressure lowering

## 2024-01-09 NOTE — Progress Notes (Signed)
 PROGRESS NOTE    Timothy Wells  FMW:[id number] DOB: [birth year] DOA: 01/08/2024 PCP: Steva Clotilda DEL, NP  Chief Complaint  Patient presents with   Emesis    Hospital Course:  Timothy Wells is a 73 y.o. male with medical history significant for Atrial flutter s/p ablation on Eliquis , previously on amiodarone , prior CVA, HTN,  venous insufficiency, last seen by cardiology in September 2025 being admitted with sustained V. tach currently on amiodarone  with which he converted to A-fib. Seen by cardiology, Hospital course as below  Subjective: Patient was examined at the bedside, new to me today.  States he feels much better today, denies any complaints   Objective: Vitals:   01/09/24 0432 01/09/24 0500 01/09/24 0700 01/09/24 1000  BP:  (!) 130/93 122/84 117/84  Pulse:  97 100 (!) 108  Resp:  17 17 (!) 22  Temp: 97.7 F (36.5 C)  98 F (36.7 C) 97.9 F (36.6 C)  TempSrc: Oral  Oral Oral  SpO2:  99% 99% 99%    Intake/Output Summary (Last 24 hours) at 01/09/2024 1035 Last data filed at 01/09/2024 0719 Gross per 24 hour  Intake 1000 ml  Output 300 ml  Net 700 ml   There were no vitals filed for this visit.  Examination: Constitutional:      General: He is not in acute distress. HENT:     Head: Normocephalic and atraumatic.  Cardiovascular:     Rate and Rhythm: Tachycardia present. Rhythm irregular.     Heart sounds: Normal heart sounds.  Pulmonary:     Effort: Pulmonary effort is normal.     Breath sounds: Normal breath sounds.  Abdominal:     Palpations: Abdomen is soft.     Tenderness: There is no abdominal tenderness.  Neurological:     Mental Status: Mental status is at baseline   Assessment & Plan:  Ventricular tachycardia Persistent Atrial fibrillation with RVR with history of ablation - Patient converted to A-fib with RVR with amiodarone  - Elevated Troponin likely due to demand ischemia, BNP elevated - Continue amiodarone  infusion for rate  control - Hold home Cardizem  and atenolol for now - Continue apixaban  for secondary stroke prevention - Echo pending - Seen by Cardiology, appreciate recs. Plan [facility] tomorrow given his wide complex tachycardia - Monitor and replete lytes as needed   Mild hyponatremia, improving - Unclear etiology - Monitor sodium  Essential hypertension - Will hold lisinopril, resume as BP tolerates   History of CVA (cerebrovascular accident) - Continue statin and antiplatelet   Venous insufficiency - No acute issues   Benign prostatic hyperplasia without lower urinary tract symptoms - Continue Myrbetriq, Flomax  DVT prophylaxis: Eliquis    Code Status: Full Code Disposition:  TBD  Consultants:  Treatment Team:  Consulting Physician: Ammon Blunt, MD  Procedures:  LHC tomorrow  Antimicrobials:  Anti-infectives (From admission, onward)    None       Data Reviewed: I have personally reviewed following labs and imaging studies CBC: Recent Labs  Lab 01/08/24 2148  WBC 10.9*  HGB 13.1  HCT 37.2*  MCV 91.0  PLT 259   Basic Metabolic Panel: Recent Labs  Lab 01/08/24 2148 01/09/24 0011  NA 129*  --   K 3.8  --   CL 91*  --   CO2 26  --   GLUCOSE 147*  --   BUN 18  --   CREATININE 0.63  --   CALCIUM  8.6*  --   MG  --  1.8   GFR: CrCl cannot be calculated (Unknown ideal weight.). Liver Function Tests: Recent Labs  Lab 01/08/24 2148  AST 32  ALT 25  ALKPHOS 74  BILITOT 0.4  PROT 6.9  ALBUMIN 4.4   CBG: No results for input(s): GLUCAP in the last 168 hours.  No results found for this or any previous visit (from the past 240 hours).   Radiology Studies: DG Chest Portable 1 View Result Date: 01/08/2024 EXAM: 1 VIEW(S) XRAY OF THE CHEST 01/08/2024 10:43:00 PM COMPARISON: Comparison study 01/02/2021. CLINICAL HISTORY: chest pain FINDINGS: LUNGS AND PLEURA: The lungs are clear bilaterally. No pleural effusion. No pneumothorax. HEART AND MEDIASTINUM:  The cardiac shadow is stable. BONES AND SOFT TISSUES: Left shoulder replacement is noted. No acute osseous abnormality. IMPRESSION: 1. No acute cardiopulmonary pathology. Electronically signed by: Oneil Devonshire MD 01/08/2024 10:51 PM EST RP Workstation: [id number]    Scheduled Meds:  apixaban   5 mg Oral BID   atorvastatin   80 mg Oral QPM   magnesium  oxide  400 mg Oral Daily   mirabegron ER  25 mg Oral Daily   Continuous Infusions:  amiodarone  30 mg/hr (01/09/24 0930)   promethazine (PHENERGAN) injection (IM or IVPB)       LOS: 0 days  No charge progress note  Laree Lock, MD [facility]  To contact the attending physician between 7A-7P please use Epic Chat. To contact the covering physician during after hours 7P-7A, please review Amion.  01/09/2024, 10:35 AM   *This document has been created with the assistance of dictation software. Please excuse typographical errors. *

## 2024-01-09 NOTE — Consult Note (Signed)
 [facility] CLINIC CARDIOLOGY CONSULT NOTE       Patient ID: Timothy Wells MRN: [id number] DOB/AGE: 11-07-1950 73 y.o.  Admit date: 01/08/2024 Referring Physician Dr. Ozell Klein Primary Physician Steva Clotilda DEL, NP  Primary Cardiologist Dr. Florencio Reason for Consultation ventricular tachycardia  HPI: Timothy Wells is a 73 y.o. male  with a past medical history of persistent atrial fibrillation s/p ablations 07/16/2020 and 07/16/2021, history of CVA, hypertension, hyperlipidemia, type 2 diabetes who presented to the ED on 01/08/2024 for emesis, generalized weakness.  Wide-complex tachycardia noted on initial EKG in the ED.  Cardiology was consulted for further evaluation.   Patient states that yesterday he had sudden onset of generalized heaviness and discomfort in his whole body.  Also endorses associated palpitations.  States that he had an episode of vomiting shortly after the symptoms began and given this decided to come to the ED for further evaluation.  Workup in the ED notable for creatinine 0.63, potassium 3.8, hemoglobin 13.1, WBC 10.9. Troponins 153 > 218, BNP 639. EKG in the ED VT rate 191 bpm. CXR with no acute abnormality. Started on IV amiodarone  in the ED.   At the time of my evaluation this morning, patient is resting comfortably in hospital bed.  We discussed his symptoms in further detail.  He states that yesterday around 3 PM he got upset with someone and shortly after decided to eat a sandwich.  After this he went to work in his yard and had sudden onset of heaviness over his whole body.  This occurred around 5 PM.  He went inside and took his evening medication but about 30 minutes later he had an episode of vomiting.  Endorses associated palpitations and lightheadedness with this feeling of being unwell.  Denies any chest pain or shortness of breath.  Also denies any syncope.  States that he has not had any exertional symptoms recently, denies angina.  Remains on  amiodarone  infusion, did have recurrence of VT overnight on telemetry which was short and did not sustain.  Review of systems complete and found to be negative unless listed above    Past Medical History:  Diagnosis Date   A-fib [facility])    Dysrhythmia    wide complex tachycardia   Edema of both lower legs    GERD (gastroesophageal reflux disease)    Memory loss    Restless leg syndrome    Skin cancer    skin cancer face basal cell   Stroke (HCC) 2019   Type 2 diabetes mellitus without complication, without long-term current use of insulin (HCC) 04/10/2019    Past Surgical History:  Procedure Laterality Date   ATRIAL FIBRILLATION ABLATION N/A 07/23/2020   Procedure: ATRIAL FIBRILLATION ABLATION;  Surgeon: Cindie Ole DASEN, MD;  Location: [facility] INVASIVE CV LAB;  Service: Cardiovascular;  Laterality: N/A;   ATRIAL FIBRILLATION ABLATION N/A 06/30/2021   Procedure: ATRIAL FIBRILLATION ABLATION;  Surgeon: Cindie Ole DASEN, MD;  Location: [facility] INVASIVE CV LAB;  Service: Cardiovascular;  Laterality: N/A;   CARDIOVERSION N/A 03/25/2019   Procedure: CARDIOVERSION;  Surgeon: Florencio Cara BIRCH, MD;  Location: [facility] ORS;  Service: Cardiovascular;  Laterality: N/A;   CARDIOVERSION N/A 05/18/2020   Procedure: CARDIOVERSION;  Surgeon: Florencio Cara BIRCH, MD;  Location: [facility] ORS;  Service: Cardiovascular;  Laterality: N/A;   CARDIOVERSION N/A 01/24/2021   Procedure: CARDIOVERSION;  Surgeon: Darron Deatrice LABOR, MD;  Location: [facility] ORS;  Service: Cardiovascular;  Laterality: N/A;   COLONOSCOPY WITH PROPOFOL  N/A 09/10/2019  Procedure: COLONOSCOPY WITH PROPOFOL ;  Surgeon: Dessa Reyes ORN, MD;  Location: [facility] ENDOSCOPY;  Service: Endoscopy;  Laterality: N/A;   REVERSE SHOULDER ARTHROPLASTY Left 12/29/2021   Procedure: REVERSE SHOULDER ARTHROPLASTY;  Surgeon: Melita Drivers, MD;  Location: [facility] ORS;  Service: Orthopedics;  Laterality: Left;    SHOULDER ARTHROSCOPY WITH SUBACROMIAL DECOMPRESSION AND OPEN  ROTATOR C Left 03/08/2020   Procedure: Left shoulder arthroscopic subscapularis repair and biodegradable balloon spacer placement with partial infraspinatus repair;  Surgeon: Tobie Priest, MD;  Location: [facility];  Service: Orthopedics;  Laterality: Left;   TEE WITHOUT CARDIOVERSION N/A 07/23/2020   Procedure: TRANSESOPHAGEAL ECHOCARDIOGRAM (TEE);  Surgeon: Jeffrie Oneil BROCKS, MD;  Location: [facility] ENDOSCOPY;  Service: Cardiovascular;  Laterality: N/A;    Medications Prior to Admission  Medication Sig Dispense Refill Last Dose/Taking   acetaminophen  (TYLENOL ) 500 MG tablet Take 1,000 mg by mouth every 6 (six) hours as needed for mild pain or headache.   Taking As Needed   apixaban  (ELIQUIS ) 5 MG TABS tablet Take 1 tablet by mouth twice daily 60 tablet 5 01/08/2024 Morning   atenolol (TENORMIN) 25 MG tablet Take 25 mg by mouth daily.   01/08/2024   atorvastatin  (LIPITOR ) 80 MG tablet Take 80 mg by mouth every evening.    01/07/2024   Cholecalciferol (VITAMIN D) 50 MCG (2000 UT) CAPS Take 4,000 Units by mouth daily.   01/08/2024   Cranberry 500 MG CAPS Take 500 mg by mouth daily.   01/08/2024   cyclobenzaprine  (FLEXERIL ) 10 MG tablet Take 1 tablet (10 mg total) by mouth 3 (three) times daily as needed for muscle spasms. 30 tablet 1 Taking As Needed   diltiazem  (CARDIZEM  CD) 360 MG 24 hr capsule Take 1 capsule by mouth once daily 90 capsule 2 01/08/2024   docusate sodium (COLACE) 100 MG capsule Take 100 mg by mouth 2 (two) times daily.   01/08/2024   doxazosin (CARDURA) 1 MG tablet Take 1 mg by mouth at bedtime.   01/08/2024   Glucosamine-Chondroitin (COSAMIN DS PO) Take 1 tablet by mouth 2 (two) times daily.   01/08/2024   magnesium  oxide (MAG-OX) 400 (241.3 Mg) MG tablet Take 1 tablet (400 mg total) by mouth daily. 30 tablet 0 01/08/2024   Multiple Vitamin (MULTIVITAMIN WITH MINERALS) TABS tablet Take 1 tablet by mouth daily.   01/08/2024   MYRBETRIQ 25 MG TB24 tablet Take 25 mg by mouth daily.    01/08/2024   Omega-3 Fatty Acids (FISH OIL) 1000 MG CAPS Take 1,000 mg by mouth daily. DHA   01/08/2024   ondansetron  (ZOFRAN ) 4 MG tablet Take 1 tablet (4 mg total) by mouth every 8 (eight) hours as needed for nausea or vomiting. 10 tablet 0 Taking As Needed   oxyCODONE -acetaminophen  (PERCOCET) 5-325 MG tablet Take 1 tablet by mouth every 4 (four) hours as needed (max 6 q). 20 tablet 0 Taking As Needed   tamsulosin (FLOMAX) 0.4 MG CAPS capsule Take 0.4 mg by mouth daily after supper.   01/08/2024   TURMERIC PO Take 1,500 mg by mouth daily.   01/08/2024   vitamin B-12 (CYANOCOBALAMIN ) 1000 MCG tablet Take 1,000 mcg by mouth daily.   01/08/2024   vitamin C (ASCORBIC ACID) 500 MG tablet Take 500 mg by mouth daily.   01/08/2024   Vitamin E 450 MG (1000 UT) CAPS Take 1,000 Units by mouth daily.   01/08/2024   Zinc 50 MG TABS Take 50 mg by mouth daily.   01/08/2024  amiodarone  (PACERONE ) 100 MG tablet Take 100 mg by mouth daily.      Chromium-Cinnamon (CINNAMON PLUS CHROMIUM PO) Take 1 tablet by mouth 2 (two) times daily. 500 mg / 100 mg      furosemide (LASIX) 20 MG tablet Take 20 mg by mouth daily as needed for edema. (Patient not taking: Reported on 01/09/2024)   Not Taking   lisinopril (ZESTRIL) 10 MG tablet Take 10 mg by mouth daily. (Patient not taking: Reported on 01/09/2024)   Not Taking   OVER THE COUNTER MEDICATION Take 1 Scoop by mouth daily. Super Beets power   Unknown   rOPINIRole (REQUIP) 1 MG tablet Take 1 mg by mouth at bedtime. (Patient not taking: Reported on 01/09/2024)   Not Taking   Social History   Socioeconomic History   Marital status: Single    Spouse name: Not on file   Number of children: 2   Years of education: Not on file   Highest education level: Not on file  Occupational History   Not on file  Tobacco Use   Smoking status: Never   Smokeless tobacco: Never   Tobacco comments:    Never smoke 07/28/21  Vaping Use   Vaping status: Never Used  Substance and  Sexual Activity   Alcohol  use: Yes    Comment: rare   Drug use: Never   Sexual activity: Not on file  Other Topics Concern   Not on file  Social History Narrative   Lives by himself; daughter lives about 15 miles away.    Social Drivers of Corporate Investment Banker Strain: Low Risk  (11/02/2023)   Received from [facility]   Overall Financial Resource Strain (CARDIA)    Difficulty of Paying Living Expenses: Not hard at all  Food Insecurity: No Food Insecurity (11/02/2023)   Received from [facility]   Hunger Vital Sign    Within the past 12 months, you worried that your food would run out before you got the money to buy more.: Never true    Within the past 12 months, the food you bought just didn't last and you didn't have money to get more.: Never true  Transportation Needs: No Transportation Needs (11/02/2023)   Received from [facility] - Transportation    In the past 12 months, has lack of transportation kept you from medical appointments or from getting medications?: No    Lack of Transportation (Non-Medical): No  Physical Activity: Sufficiently Active (11/02/2023)   Received from [facility]   Exercise Vital Sign    On average, how many days per week do you engage in moderate to strenuous exercise (like a brisk walk)?: 5 days    On average, how many minutes do you engage in exercise at this level?: 30 min  Stress: No Stress Concern Present (11/02/2023)   Received from [facility]ational [facility] - Occupational Stress Questionnaire    Feeling of Stress : Not at all  Social Connections: Moderately Integrated (11/02/2023)   Received from [facility]   Social Connection and Isolation Panel    In a typical week, how many times do you talk on the phone with family, friends, or neighbors?: More than three times a week    How often do you get  together with friends or relatives?: More than three times a week    How often do  you attend church or religious services?: More than 4 times per year    Do you belong to any clubs or organizations such as church groups, unions, fraternal or athletic groups, or school groups?: Yes    How often do you attend meetings of the clubs or organizations you belong to?: More than 4 times per year    Are you married, widowed, divorced, separated, never married, or living with a partner?: Divorced  Intimate Partner Violence: Not on file    No family history on file.   Vitals:   01/09/24 0500 01/09/24 0700 01/09/24 1000 01/09/24 1100  BP: (!) 130/93 122/84 117/84 109/86  Pulse: 97 100 (!) 108 (!) 113  Resp: 17 17 (!) 22 15  Temp:  98 F (36.7 C) 97.9 F (36.6 C)   TempSrc:  Oral Oral   SpO2: 99% 99% 99% 97%    PHYSICAL EXAM General: Well-appearing elderly male, well nourished, in no acute distress. HEENT: Normocephalic and atraumatic. Neck: No JVD.  Lungs: Normal respiratory effort on room air. Clear bilaterally to auscultation. No wheezes, crackles, rhonchi.  Heart: HRRR. Normal S1 and S2 without gallops or murmurs.  Abdomen: Non-distended appearing.  Msk: Normal strength and tone for age. Extremities: Warm and well perfused. No clubbing, cyanosis.  No edema.  Neuro: Alert and oriented X 3. Psych: Answers questions appropriately.   Labs: Basic Metabolic Panel: Recent Labs    01/08/24 2148 01/09/24 0011  NA 129*  --   K 3.8  --   CL 91*  --   CO2 26  --   GLUCOSE 147*  --   BUN 18  --   CREATININE 0.63  --   CALCIUM  8.6*  --   MG  --  1.8   Liver Function Tests: Recent Labs    01/08/24 2148  AST 32  ALT 25  ALKPHOS 74  BILITOT 0.4  PROT 6.9  ALBUMIN 4.4   Recent Labs    01/08/24 2148  LIPASE 23   CBC: Recent Labs    01/08/24 2148  WBC 10.9*  HGB 13.1  HCT 37.2*  MCV 91.0  PLT 259   Cardiac Enzymes: No results for input(s): CKTOTAL, CKMB,  CKMBINDEX, TROPONINIHS in the last 72 hours. BNP: No results for input(s): BNP in the last 72 hours. D-Dimer: No results for input(s): DDIMER in the last 72 hours. Hemoglobin A1C: No results for input(s): HGBA1C in the last 72 hours. Fasting Lipid Panel: No results for input(s): CHOL, HDL, LDLCALC, TRIG, CHOLHDL, LDLDIRECT in the last 72 hours. Thyroid  Function Tests: No results for input(s): TSH, T4TOTAL, T3FREE, THYROIDAB in the last 72 hours.  Invalid input(s): FREET3 Anemia Panel: No results for input(s): VITAMINB12, FOLATE, FERRITIN, TIBC, IRON, RETICCTPCT in the last 72 hours.   Radiology: DG Chest Portable 1 View Result Date: 01/08/2024 EXAM: 1 VIEW(S) XRAY OF THE CHEST 01/08/2024 10:43:00 PM COMPARISON: Comparison study 01/02/2021. CLINICAL HISTORY: chest pain FINDINGS: LUNGS AND PLEURA: The lungs are clear bilaterally. No pleural effusion. No pneumothorax. HEART AND MEDIASTINUM: The cardiac shadow is stable. BONES AND SOFT TISSUES: Left shoulder replacement is noted. No acute osseous abnormality. IMPRESSION: 1. No acute cardiopulmonary pathology. Electronically signed by: Oneil Devonshire MD 01/08/2024 10:51 PM EST RP Workstation: [id number]    ECHO ordered  TELEMETRY (personally reviewed): Sinus rhythm with frequent PACs versus atrial fibrillation rate 90s  EKG (personally reviewed): Ventricular tachycardia rate 191 bpm  Data reviewed by me 01/09/2024: last 24h vitals tele labs imaging I/O  ED provider note, admission H&P  Principal Problem:   Wide-complex tachycardia Active Problems:   History of CVA (cerebrovascular accident)   Benign prostatic hyperplasia without lower urinary tract symptoms   Essential hypertension   Venous insufficiency    ASSESSMENT AND PLAN:  Timothy Wells is a 73 y.o. male  with a past medical history of persistent atrial fibrillation s/p ablations 07/16/2020 and 07/16/2021, history of CVA, hypertension,  hyperlipidemia, type 2 diabetes who presented to the ED on 01/08/2024 for emesis, generalized weakness.  Wide-complex tachycardia noted on initial EKG in the ED.  Cardiology was consulted for further evaluation.   # Ventricular tachycardia # Demand ischemia # Persistent atrial fibrillation # Hypertension Patient presented to the ED after having sudden onset of generalized whole body discomfort and weakness with associated episode of vomiting.  Initial EKG in the ED with wide-complex tachycardia heart rate 191 bpm.  He was normotensive and mentating well so he was started on IV amiodarone .  Troponins mildly elevated and trended 153 > 218. - Echo ordered.  Further recommendations pending these results - Continue IV amiodarone . - Continue Eliquis  5 mg twice daily. - Continue atorvastatin  80 mg daily. - Suspect mild troponin elevation most consistent with demand/supply mismatch and not ACS as he is without chest pain however given his wide-complex tachycardia on admission we will plan to set him up for [facility] tomorrow for additional evaluation.   This patient's plan of care was discussed and created with Dr. Ammon and he is in agreement.  Signed: Danita Bloch, PA-C  01/09/2024, 11:28 AM [facility]

## 2024-01-09 NOTE — Progress Notes (Signed)
*  PRELIMINARY RESULTS* Echocardiogram 2D Echocardiogram has been performed.  Timothy Wells 01/09/2024, 2:41 PM

## 2024-01-10 DIAGNOSIS — R000 Tachycardia, unspecified: Secondary | ICD-10-CM | POA: Diagnosis not present

## 2024-01-10 LAB — ECHOCARDIOGRAM COMPLETE
AR max vel: 2.61 cm2
AV Area VTI: 2.97 cm2
AV Area mean vel: 2.58 cm2
AV Mean grad: 2 mmHg
AV Peak grad: 3.2 mmHg
Ao pk vel: 0.89 m/s
Area-P 1/2: 5.46 cm2
Height: 67 in
S' Lateral: 2.18 cm
Weight: 2388.02 [oz_av]

## 2024-01-10 LAB — CBC
HCT: 38.2 % — ABNORMAL LOW (ref 39.0–52.0)
Hemoglobin: 13.6 g/dL (ref 13.0–17.0)
MCH: 32.1 pg (ref 26.0–34.0)
MCHC: 35.6 g/dL (ref 30.0–36.0)
MCV: 90.1 fL (ref 80.0–100.0)
Platelets: 221 K/uL (ref 150–400)
RBC: 4.24 MIL/uL (ref 4.22–5.81)
RDW: 12.8 % (ref 11.5–15.5)
WBC: 8.5 K/uL (ref 4.0–10.5)
nRBC: 0 % (ref 0.0–0.2)

## 2024-01-10 LAB — BASIC METABOLIC PANEL WITH GFR
Anion gap: 7 (ref 5–15)
BUN: 11 mg/dL (ref 8–23)
CO2: 27 mmol/L (ref 22–32)
Calcium: 8.7 mg/dL — ABNORMAL LOW (ref 8.9–10.3)
Chloride: 97 mmol/L — ABNORMAL LOW (ref 98–111)
Creatinine, Ser: 0.68 mg/dL (ref 0.61–1.24)
GFR, Estimated: >60 mL/min (ref >60)
Glucose, Bld: 91 mg/dL (ref 70–99)
Potassium: 4.1 mmol/L (ref 3.5–5.1)
Sodium: 131 mmol/L — ABNORMAL LOW (ref 135–145)

## 2024-01-10 LAB — APTT
aPTT: 32 s (ref 24–36)
aPTT: 61 s — ABNORMAL HIGH (ref 24–36)

## 2024-01-10 LAB — MAGNESIUM: Magnesium: 2.1 mg/dL (ref 1.7–2.4)

## 2024-01-10 LAB — PROTIME-INR
INR: 1.1 (ref 0.8–1.2)
Prothrombin Time: 14.8 s (ref 11.4–15.2)

## 2024-01-10 LAB — HEPARIN LEVEL (UNFRACTIONATED): Heparin Unfractionated: >1.1 [IU]/mL — ABNORMAL HIGH (ref 0.30–0.70)

## 2024-01-10 MED ORDER — DIGOXIN 0.25 MG/ML IJ SOLN
0.1250 mg | Freq: Once | INTRAMUSCULAR | Status: AC
  Administered 2024-01-10: 0.125 mg via INTRAVENOUS
  Filled 2024-01-10: qty 2

## 2024-01-10 MED ORDER — METOPROLOL TARTRATE 25 MG PO TABS
37.5000 mg | ORAL_TABLET | Freq: Four times a day (QID) | ORAL | Status: DC
  Administered 2024-01-11 (×2): 37.5 mg via ORAL
  Filled 2024-01-10 (×2): qty 2

## 2024-01-10 MED ORDER — HEPARIN BOLUS VIA INFUSION
1000.0000 [IU] | Freq: Once | INTRAVENOUS | Status: AC
Start: 2024-01-10 — End: 2024-01-10
  Administered 2024-01-10: 1000 [IU] via INTRAVENOUS
  Filled 2024-01-10: qty 1000

## 2024-01-10 MED ORDER — HEPARIN (PORCINE) 25000 UT/250ML-% IV SOLN
1100.0000 [IU]/h | INTRAVENOUS | Status: DC
  Administered 2024-01-10: 950 [IU]/h via INTRAVENOUS
  Administered 2024-01-11: 1100 [IU]/h via INTRAVENOUS
  Filled 2024-01-10 (×2): qty 250

## 2024-01-10 MED ORDER — ALUM & MAG HYDROXIDE-SIMETH 200-200-20 MG/5ML PO SUSP
30.0000 mL | Freq: Four times a day (QID) | ORAL | Status: DC | PRN
  Administered 2024-01-10: 30 mL via ORAL
  Filled 2024-01-10: qty 30

## 2024-01-10 MED ORDER — PANTOPRAZOLE SODIUM 40 MG PO TBEC
40.0000 mg | DELAYED_RELEASE_TABLET | Freq: Every day | ORAL | Status: DC
  Administered 2024-01-10 – 2024-01-12 (×3): 40 mg via ORAL
  Filled 2024-01-10 (×3): qty 1

## 2024-01-10 NOTE — Consult Note (Signed)
 PHARMACY - ANTICOAGULATION CONSULT NOTE  Pharmacy Consult for heparin  dosing Indication: atrial fibrillation  No Known Allergies  Patient Measurements: Height: 5' 7 (170.2 cm) Weight: 67.7 kg (149 lb 4 oz) IBW/kg (Calculated) : 66.1 HEPARIN  DW (KG): 67.7  Vital Signs: Temp: 98.2 F (36.8 C) (11/13 1600) Temp Source: Oral (11/13 1600) BP: 99/76 (11/13 1600) Pulse Rate: 130 (11/13 1638)  Labs: Recent Labs    01/08/24 2148 01/09/24 1109 01/10/24 0506 01/10/24 0909 01/10/24 1748  HGB 13.1  --  13.6  --   --   HCT 37.2*  --  38.2*  --   --   PLT 259  --  221  --   --   APTT  --   --   --  32 61*  LABPROT  --   --   --  14.8  --   INR  --   --   --  1.1  --   HEPARINUNFRC  --   --   --  >1.10*  --   CREATININE 0.63 0.55* 0.68  --   --     Estimated Creatinine Clearance: 79.2 mL/min (by C-G formula based on SCr of 0.68 mg/dL).   Medical History: Past Medical History:  Diagnosis Date   Timothy-fib [facility])    Dysrhythmia    wide complex tachycardia   Edema of both lower legs    GERD (gastroesophageal reflux disease)    Memory loss    Restless leg syndrome    Skin cancer    skin cancer face basal cell   Stroke (HCC) 2019   Type 2 diabetes mellitus without complication, without long-term current use of insulin (HCC) 04/10/2019    Medications:  PTA apixaban  5mg  bid - last dose 2143 on 11/12  Assessment: Timothy Wells is Timothy 81 yoM that has presented with ventricular tachycardia conversion to atrial fibrillation with RVR. Past medical history notable for atrial flutter s/p ablation on Eliquis , previously on amiodarone , prior CVA, and venous insufficiency.   Goal of Therapy:  Heparin  level 0.3-0.7 units/ml aPTT 66-102 seconds Monitor platelets by anticoagulation protocol: Yes   Baseline aPTT 32, HL >1.10, INR 1.1. Hgb and plt stable.  Date/Time: aPTT/HL: Comment/Rate: 11/3@1748  61sec/-- Subtherapeutic@950  units/hr  Plan:  Bolus 1000 units x 1 Increase heparin   infusion to 1100 units/hr Check aPTT in 8 hours after rate change, and continue to monitor aPTT daily until correlation with HL Continue to monitor H&H and platelets   Timothy Wells Timothy Wells 01/10/2024,6:27 PM

## 2024-01-10 NOTE — Plan of Care (Signed)
 Problem: Education: Goal: Knowledge of disease or condition will improve 01/10/2024 0156 by Jenness Dorn ORN, RN Outcome: Progressing 01/10/2024 0156 by Jenness Dorn ORN, RN Outcome: Progressing Goal: Understanding of medication regimen will improve 01/10/2024 0156 by Jenness Dorn ORN, RN Outcome: Progressing 01/10/2024 0156 by Jenness Dorn ORN, RN Outcome: Progressing Goal: Individualized Educational Video(s) 01/10/2024 0156 by Jenness Dorn ORN, RN Outcome: Progressing 01/10/2024 0156 by Jenness Dorn ORN, RN Outcome: Progressing   Problem: Activity: Goal: Ability to tolerate increased activity will improve 01/10/2024 0156 by Jenness Dorn ORN, RN Outcome: Progressing 01/10/2024 0156 by Jenness Dorn ORN, RN Outcome: Progressing   Problem: Cardiac: Goal: Ability to achieve and maintain adequate cardiopulmonary perfusion will improve 01/10/2024 0156 by Jenness Dorn ORN, RN Outcome: Progressing 01/10/2024 0156 by Jenness Dorn ORN, RN Outcome: Progressing   Problem: Health Behavior/Discharge Planning: Goal: Ability to safely manage health-related needs after discharge will improve 01/10/2024 0156 by Jenness Dorn ORN, RN Outcome: Progressing 01/10/2024 0156 by Jenness Dorn ORN, RN Outcome: Progressing   Problem: Education: Goal: Knowledge of General Education information will improve Description: Including pain rating scale, medication(s)/side effects and non-pharmacologic comfort measures 01/10/2024 0156 by Jenness Dorn ORN, RN Outcome: Progressing 01/10/2024 0156 by Jenness Dorn ORN, RN Outcome: Progressing   Problem: Health Behavior/Discharge Planning: Goal: Ability to manage health-related needs will improve 01/10/2024 0156 by Jenness Dorn ORN, RN Outcome: Progressing 01/10/2024 0156 by Jenness Dorn ORN, RN Outcome: Progressing   Problem: Clinical Measurements: Goal: Ability to maintain clinical measurements within normal limits will improve 01/10/2024 0156 by  Jenness Dorn ORN, RN Outcome: Progressing 01/10/2024 0156 by Jenness Dorn ORN, RN Outcome: Progressing Goal: Will remain free from infection 01/10/2024 0156 by Jenness Dorn ORN, RN Outcome: Progressing 01/10/2024 0156 by Jenness Dorn ORN, RN Outcome: Progressing Goal: Diagnostic test results will improve 01/10/2024 0156 by Jenness Dorn ORN, RN Outcome: Progressing 01/10/2024 0156 by Jenness Dorn ORN, RN Outcome: Progressing Goal: Respiratory complications will improve 01/10/2024 0156 by Jenness Dorn ORN, RN Outcome: Progressing 01/10/2024 0156 by Jenness Dorn ORN, RN Outcome: Progressing Goal: Cardiovascular complication will be avoided 01/10/2024 0156 by Jenness Dorn ORN, RN Outcome: Progressing 01/10/2024 0156 by Jenness Dorn ORN, RN Outcome: Progressing   Problem: Activity: Goal: Risk for activity intolerance will decrease 01/10/2024 0156 by Jenness Dorn ORN, RN Outcome: Progressing 01/10/2024 0156 by Jenness Dorn ORN, RN Outcome: Progressing   Problem: Nutrition: Goal: Adequate nutrition will be maintained 01/10/2024 0156 by Jenness Dorn ORN, RN Outcome: Progressing 01/10/2024 0156 by Jenness Dorn ORN, RN Outcome: Progressing   Problem: Coping: Goal: Level of anxiety will decrease 01/10/2024 0156 by Jenness Dorn ORN, RN Outcome: Progressing 01/10/2024 0156 by Jenness Dorn ORN, RN Outcome: Progressing   Problem: Elimination: Goal: Will not experience complications related to bowel motility 01/10/2024 0156 by Jenness Dorn ORN, RN Outcome: Progressing 01/10/2024 0156 by Jenness Dorn ORN, RN Outcome: Progressing Goal: Will not experience complications related to urinary retention 01/10/2024 0156 by Jenness Dorn ORN, RN Outcome: Progressing 01/10/2024 0156 by Jenness Dorn ORN, RN Outcome: Progressing   Problem: Pain Managment: Goal: General experience of comfort will improve and/or be controlled 01/10/2024 0156 by Jenness Dorn ORN, RN Outcome:  Progressing 01/10/2024 0156 by Jenness Dorn ORN, RN Outcome: Progressing   Problem: Safety: Goal: Ability to remain free from injury will improve 01/10/2024 0156 by Jenness Dorn ORN, RN Outcome: Progressing 01/10/2024 0156 by Jenness Dorn ORN, RN Outcome: Progressing   Problem: Skin Integrity: Goal: Risk for impaired skin integrity will decrease 01/10/2024 0156 by  Jenness Dorn ORN, RN Outcome: Progressing 01/10/2024 0156 by Jenness Dorn ORN, RN Outcome: Progressing   Problem: Education: Goal: Understanding of CV disease, CV risk reduction, and recovery process will improve 01/10/2024 0156 by Jenness Dorn ORN, RN Outcome: Progressing 01/10/2024 0156 by Jenness Dorn ORN, RN Outcome: Progressing Goal: Individualized Educational Video(s) 01/10/2024 0156 by Jenness Dorn ORN, RN Outcome: Progressing 01/10/2024 0156 by Jenness Dorn ORN, RN Outcome: Progressing   Problem: Activity: Goal: Ability to return to baseline activity level will improve 01/10/2024 0156 by Jenness Dorn ORN, RN Outcome: Progressing 01/10/2024 0156 by Jenness Dorn ORN, RN Outcome: Progressing   Problem: Cardiovascular: Goal: Ability to achieve and maintain adequate cardiovascular perfusion will improve 01/10/2024 0156 by Jenness Dorn ORN, RN Outcome: Progressing 01/10/2024 0156 by Jenness Dorn ORN, RN Outcome: Progressing Goal: Vascular access site(s) Level 0-1 will be maintained 01/10/2024 0156 by Jenness Dorn ORN, RN Outcome: Progressing 01/10/2024 0156 by Jenness Dorn ORN, RN Outcome: Progressing   Problem: Health Behavior/Discharge Planning: Goal: Ability to safely manage health-related needs after discharge will improve 01/10/2024 0156 by Jenness Dorn ORN, RN Outcome: Progressing 01/10/2024 0156 by Jenness Dorn ORN, RN Outcome: Progressing   Problem: Education: Goal: Understanding of CV disease, CV risk reduction, and recovery process will improve 01/10/2024 0156 by Jenness Dorn ORN,  RN Outcome: Progressing 01/10/2024 0156 by Jenness Dorn ORN, RN Outcome: Progressing Goal: Individualized Educational Video(s) 01/10/2024 0156 by Jenness Dorn ORN, RN Outcome: Progressing 01/10/2024 0156 by Jenness Dorn ORN, RN Outcome: Progressing   Problem: Activity: Goal: Ability to return to baseline activity level will improve 01/10/2024 0156 by Jenness Dorn ORN, RN Outcome: Progressing 01/10/2024 0156 by Jenness Dorn ORN, RN Outcome: Progressing   Problem: Cardiovascular: Goal: Ability to achieve and maintain adequate cardiovascular perfusion will improve 01/10/2024 0156 by Jenness Dorn ORN, RN Outcome: Progressing 01/10/2024 0156 by Jenness Dorn ORN, RN Outcome: Progressing Goal: Vascular access site(s) Level 0-1 will be maintained 01/10/2024 0156 by Jenness Dorn ORN, RN Outcome: Progressing 01/10/2024 0156 by Jenness Dorn ORN, RN Outcome: Progressing   Problem: Health Behavior/Discharge Planning: Goal: Ability to safely manage health-related needs after discharge will improve 01/10/2024 0156 by Jenness Dorn ORN, RN Outcome: Progressing 01/10/2024 0156 by Jenness Dorn ORN, RN Outcome: Progressing

## 2024-01-10 NOTE — Plan of Care (Signed)
  Problem: Education: Goal: Knowledge of disease or condition will improve Outcome: Progressing   Problem: Activity: Goal: Ability to tolerate increased activity will improve Outcome: Progressing   Problem: Health Behavior/Discharge Planning: Goal: Ability to safely manage health-related needs after discharge will improve Outcome: Progressing   Problem: Education: Goal: Knowledge of General Education information will improve Description: Including pain rating scale, medication(s)/side effects and non-pharmacologic comfort measures Outcome: Progressing   Problem: Clinical Measurements: Goal: Respiratory complications will improve Outcome: Progressing   Problem: Nutrition: Goal: Adequate nutrition will be maintained Outcome: Progressing

## 2024-01-10 NOTE — Plan of Care (Signed)
  Problem: Education: Goal: Knowledge of disease or condition will improve Outcome: Progressing Goal: Understanding of medication regimen will improve Outcome: Progressing Goal: Individualized Educational Video(s) Outcome: Progressing   Problem: Activity: Goal: Ability to tolerate increased activity will improve Outcome: Progressing   Problem: Cardiac: Goal: Ability to achieve and maintain adequate cardiopulmonary perfusion will improve Outcome: Progressing   Problem: Health Behavior/Discharge Planning: Goal: Ability to safely manage health-related needs after discharge will improve Outcome: Progressing   Problem: Education: Goal: Knowledge of General Education information will improve Description: Including pain rating scale, medication(s)/side effects and non-pharmacologic comfort measures Outcome: Progressing   Problem: Health Behavior/Discharge Planning: Goal: Ability to manage health-related needs will improve Outcome: Progressing   Problem: Clinical Measurements: Goal: Ability to maintain clinical measurements within normal limits will improve Outcome: Progressing Goal: Will remain free from infection Outcome: Progressing Goal: Diagnostic test results will improve Outcome: Progressing Goal: Respiratory complications will improve Outcome: Progressing Goal: Cardiovascular complication will be avoided Outcome: Progressing   Problem: Activity: Goal: Risk for activity intolerance will decrease Outcome: Progressing   Problem: Nutrition: Goal: Adequate nutrition will be maintained Outcome: Progressing   Problem: Coping: Goal: Level of anxiety will decrease Outcome: Progressing   Problem: Elimination: Goal: Will not experience complications related to bowel motility Outcome: Progressing Goal: Will not experience complications related to urinary retention Outcome: Progressing   Problem: Pain Managment: Goal: General experience of comfort will improve and/or be  controlled Outcome: Progressing   Problem: Safety: Goal: Ability to remain free from injury will improve Outcome: Progressing   Problem: Skin Integrity: Goal: Risk for impaired skin integrity will decrease Outcome: Progressing   Problem: Education: Goal: Understanding of CV disease, CV risk reduction, and recovery process will improve Outcome: Progressing Goal: Individualized Educational Video(s) Outcome: Progressing   Problem: Activity: Goal: Ability to return to baseline activity level will improve Outcome: Progressing   Problem: Cardiovascular: Goal: Ability to achieve and maintain adequate cardiovascular perfusion will improve Outcome: Progressing Goal: Vascular access site(s) Level 0-1 will be maintained Outcome: Progressing   Problem: Health Behavior/Discharge Planning: Goal: Ability to safely manage health-related needs after discharge will improve Outcome: Progressing   Problem: Education: Goal: Understanding of CV disease, CV risk reduction, and recovery process will improve Outcome: Progressing Goal: Individualized Educational Video(s) Outcome: Progressing   Problem: Activity: Goal: Ability to return to baseline activity level will improve Outcome: Progressing   Problem: Cardiovascular: Goal: Ability to achieve and maintain adequate cardiovascular perfusion will improve Outcome: Progressing Goal: Vascular access site(s) Level 0-1 will be maintained Outcome: Progressing   Problem: Health Behavior/Discharge Planning: Goal: Ability to safely manage health-related needs after discharge will improve Outcome: Progressing

## 2024-01-10 NOTE — Progress Notes (Signed)
 14:27- Pt complaining of heart burn, per pt just feels bad and bloated. Dr. Jerelene notified. Per MD will order protonix ,.  16:05- Pt continue to complain of heart burn. Per pt protonix  did not help. Pt tachy on tele HR 120-130's. BP 99/76. Caralyn PA notified. Per PA will order digoxin.Will administer and continue to monitor.

## 2024-01-10 NOTE — Progress Notes (Signed)
 [facility] CLINIC CARDIOLOGY PROGRESS NOTE       Patient ID: Timothy Wells MRN: [id number] DOB/AGE: [birth year] 73 y.o.  Admit date: 01/08/2024 Referring Physician Dr. Ozell Klein Primary Physician Steva Clotilda DEL, NP  Primary Cardiologist Dr. Florencio Reason for Consultation ventricular tachycardia  HPI: Timothy Wells is a 73 y.o. male  with a past medical history of persistent atrial fibrillation s/p ablations 07/16/2020 and 07/16/2021, history of CVA, hypertension, hyperlipidemia, type 2 diabetes who presented to the ED on 01/08/2024 for emesis, generalized weakness.  Wide-complex tachycardia noted on initial EKG in the ED.  Cardiology was consulted for further evaluation.   Interval history: - Patient seen and examined this morning, resting comfortably in hospital bed. - Heart rate elevated overnight into this morning on telemetry, appears to be atrial fibrillation rate 110s. - Unfortunately patient was not switched to heparin  yesterday from his home Eliquis  thus cath will need to be postponed to tomorrow. - He remains without chest pain, shortness of breath.  No recurrence of VT on telemetry.  Review of systems complete and found to be negative unless listed above    Past Medical History:  Diagnosis Date   A-fib [facility])    Dysrhythmia    wide complex tachycardia   Edema of both lower legs    GERD (gastroesophageal reflux disease)    Memory loss    Restless leg syndrome    Skin cancer    skin cancer face basal cell   Stroke (HCC) 2019   Type 2 diabetes mellitus without complication, without long-term current use of insulin (HCC) 04/10/2019    Past Surgical History:  Procedure Laterality Date   ATRIAL FIBRILLATION ABLATION N/A 07/23/2020   Procedure: ATRIAL FIBRILLATION ABLATION;  Surgeon: Cindie Ole DASEN, MD;  Location: [facility] INVASIVE CV LAB;  Service: Cardiovascular;  Laterality: N/A;   ATRIAL FIBRILLATION ABLATION N/A 06/30/2021   Procedure: ATRIAL FIBRILLATION ABLATION;   Surgeon: Cindie Ole DASEN, MD;  Location: [facility] INVASIVE CV LAB;  Service: Cardiovascular;  Laterality: N/A;   CARDIOVERSION N/A 03/25/2019   Procedure: CARDIOVERSION;  Surgeon: Florencio Cara BIRCH, MD;  Location: [facility] ORS;  Service: Cardiovascular;  Laterality: N/A;   CARDIOVERSION N/A 05/18/2020   Procedure: CARDIOVERSION;  Surgeon: Florencio Cara BIRCH, MD;  Location: [facility] ORS;  Service: Cardiovascular;  Laterality: N/A;   CARDIOVERSION N/A 01/24/2021   Procedure: CARDIOVERSION;  Surgeon: Darron Deatrice LABOR, MD;  Location: [facility] ORS;  Service: Cardiovascular;  Laterality: N/A;   COLONOSCOPY WITH PROPOFOL  N/A 09/10/2019   Procedure: COLONOSCOPY WITH PROPOFOL ;  Surgeon: Dessa Reyes LELON, MD;  Location: [facility] ENDOSCOPY;  Service: Endoscopy;  Laterality: N/A;   REVERSE SHOULDER ARTHROPLASTY Left 12/29/2021   Procedure: REVERSE SHOULDER ARTHROPLASTY;  Surgeon: Melita Drivers, MD;  Location: [facility] ORS;  Service: Orthopedics;  Laterality: Left;    SHOULDER ARTHROSCOPY WITH SUBACROMIAL DECOMPRESSION AND OPEN ROTATOR C Left 03/08/2020   Procedure: Left shoulder arthroscopic subscapularis repair and biodegradable balloon spacer placement with partial infraspinatus repair;  Surgeon: Tobie Priest, MD;  Location: [facility];  Service: Orthopedics;  Laterality: Left;   TEE WITHOUT CARDIOVERSION N/A 07/23/2020   Procedure: TRANSESOPHAGEAL ECHOCARDIOGRAM (TEE);  Surgeon: Jeffrie Oneil BROCKS, MD;  Location: [facility] ENDOSCOPY;  Service: Cardiovascular;  Laterality: N/A;    Medications Prior to Admission  Medication Sig Dispense Refill Last Dose/Taking   acetaminophen  (TYLENOL ) 500 MG tablet Take 1,000 mg by mouth every 6 (six) hours as needed for mild pain or headache.   Taking As Needed  apixaban  (ELIQUIS ) 5 MG TABS tablet Take 1 tablet by mouth twice daily 60 tablet 5 01/08/2024 Morning   atenolol (TENORMIN) 25 MG tablet Take 25 mg by mouth daily.   01/08/2024   atorvastatin  (LIPITOR ) 80 MG tablet Take 80 mg by  mouth every evening.    01/07/2024   Cholecalciferol (VITAMIN D) 50 MCG (2000 UT) CAPS Take 4,000 Units by mouth daily.   01/08/2024   Cranberry 500 MG CAPS Take 500 mg by mouth daily.   01/08/2024   cyclobenzaprine  (FLEXERIL ) 10 MG tablet Take 1 tablet (10 mg total) by mouth 3 (three) times daily as needed for muscle spasms. 30 tablet 1 Taking As Needed   diltiazem  (CARDIZEM  CD) 360 MG 24 hr capsule Take 1 capsule by mouth once daily 90 capsule 2 01/08/2024   docusate sodium (COLACE) 100 MG capsule Take 100 mg by mouth 2 (two) times daily.   01/08/2024   doxazosin (CARDURA) 1 MG tablet Take 1 mg by mouth at bedtime.   01/08/2024   Glucosamine-Chondroitin (COSAMIN DS PO) Take 1 tablet by mouth 2 (two) times daily.   01/08/2024   magnesium  oxide (MAG-OX) 400 (241.3 Mg) MG tablet Take 1 tablet (400 mg total) by mouth daily. 30 tablet 0 01/08/2024   Multiple Vitamin (MULTIVITAMIN WITH MINERALS) TABS tablet Take 1 tablet by mouth daily.   01/08/2024   MYRBETRIQ 25 MG TB24 tablet Take 25 mg by mouth daily.   01/08/2024   Omega-3 Fatty Acids (FISH OIL) 1000 MG CAPS Take 1,000 mg by mouth daily. DHA   01/08/2024   ondansetron  (ZOFRAN ) 4 MG tablet Take 1 tablet (4 mg total) by mouth every 8 (eight) hours as needed for nausea or vomiting. 10 tablet 0 Taking As Needed   oxyCODONE -acetaminophen  (PERCOCET) 5-325 MG tablet Take 1 tablet by mouth every 4 (four) hours as needed (max 6 q). 20 tablet 0 Taking As Needed   tamsulosin (FLOMAX) 0.4 MG CAPS capsule Take 0.4 mg by mouth daily after supper.   01/08/2024   TURMERIC PO Take 1,500 mg by mouth daily.   01/08/2024   vitamin B-12 (CYANOCOBALAMIN ) 1000 MCG tablet Take 1,000 mcg by mouth daily.   01/08/2024   vitamin C (ASCORBIC ACID) 500 MG tablet Take 500 mg by mouth daily.   01/08/2024   Vitamin E 450 MG (1000 UT) CAPS Take 1,000 Units by mouth daily.   01/08/2024   Zinc 50 MG TABS Take 50 mg by mouth daily.   01/08/2024   amiodarone  (PACERONE ) 100 MG tablet  Take 100 mg by mouth daily.      Chromium-Cinnamon (CINNAMON PLUS CHROMIUM PO) Take 1 tablet by mouth 2 (two) times daily. 500 mg / 100 mg      furosemide (LASIX) 20 MG tablet Take 20 mg by mouth daily as needed for edema. (Patient not taking: Reported on 01/09/2024)   Not Taking   lisinopril (ZESTRIL) 10 MG tablet Take 10 mg by mouth daily. (Patient not taking: Reported on 01/09/2024)   Not Taking   OVER THE COUNTER MEDICATION Take 1 Scoop by mouth daily. Super Beets power   Unknown   rOPINIRole (REQUIP) 1 MG tablet Take 1 mg by mouth at bedtime. (Patient not taking: Reported on 01/09/2024)   Not Taking   Social History   Socioeconomic History   Marital status: Single    Spouse name: Not on file   Number of children: 2   Years of education: Not on file   Highest education  level: Not on file  Occupational History   Not on file  Tobacco Use   Smoking status: Never   Smokeless tobacco: Never   Tobacco comments:    Never smoke 07/28/21  Vaping Use   Vaping status: Never Used  Substance and Sexual Activity   Alcohol  use: Yes    Comment: rare   Drug use: Never   Sexual activity: Not on file  Other Topics Concern   Not on file  Social History Narrative   Lives by himself; daughter lives about 15 miles away.    Social Drivers of Corporate Investment Banker Strain: Low Risk  (11/02/2023)   Received from [facility]   Overall Financial Resource Strain (CARDIA)    Difficulty of Paying Living Expenses: Not hard at all  Food Insecurity: No Food Insecurity (01/09/2024)   Hunger Vital Sign    Worried About Running Out of Food in the Last Year: Never true    Ran Out of Food in the Last Year: Never true  Transportation Needs: No Transportation Needs (01/09/2024)   PRAPARE - Administrator, Civil Service (Medical): No    Lack of Transportation (Non-Medical): No  Physical Activity: Sufficiently Active (11/02/2023)   Received from [facility]    Exercise Vital Sign    On average, how many days per week do you engage in moderate to strenuous exercise (like a brisk walk)?: 5 days    On average, how many minutes do you engage in exercise at this level?: 30 min  Stress: No Stress Concern Present (11/02/2023)   Received from [facility] of Occupational Health - Occupational Stress Questionnaire    Feeling of Stress : Not at all  Social Connections: Moderately Isolated (01/09/2024)   Social Connection and Isolation Panel    Frequency of Communication with Friends and Family: Three times a week    Frequency of Social Gatherings with Friends and Family: Three times a week    Attends Religious Services: More than 4 times per year    Active Member of Clubs or Organizations: No    Attends Banker Meetings: Patient unable to answer    Marital Status: Widowed  Intimate Partner Violence: Not At Risk (01/09/2024)   Humiliation, Afraid, Rape, and Kick questionnaire    Fear of Current or Ex-Partner: No    Emotionally Abused: No    Physically Abused: No    Sexually Abused: No    No family history on file.   Vitals:   01/10/24 0400 01/10/24 0500 01/10/24 0700 01/10/24 0800  BP: 114/87 119/88 121/72 110/86  Pulse: 100 100 (!) 113 (!) 102  Resp: 16 16 18 16   Temp: 98.5 F (36.9 C)   99.6 F (37.6 C)  TempSrc: Oral   Oral  SpO2: 98% 97% 97% 96%  Weight:      Height:        PHYSICAL EXAM General: Well-appearing elderly male, well nourished, in no acute distress. HEENT: Normocephalic and atraumatic. Neck: No JVD.  Lungs: Normal respiratory effort on room air. Clear bilaterally to auscultation. No wheezes, crackles, rhonchi.  Heart: HRRR. Normal S1 and S2 without gallops or murmurs.  Abdomen: Non-distended appearing.  Msk: Normal strength and tone for age. Extremities: Warm and well perfused. No clubbing, cyanosis.  No edema.  Neuro: Alert and oriented X 3. Psych: Answers questions  appropriately.   Labs: Basic Metabolic Panel: Recent Labs  01/09/24 0011 01/09/24 1109 01/10/24 0506  NA  --  130* 131*  K  --  4.0 4.1  CL  --  98 97*  CO2  --  24 27  GLUCOSE  --  102* 91  BUN  --  8 11  CREATININE  --  0.55* 0.68  CALCIUM   --  8.4* 8.7*  MG 1.8  --  2.1   Liver Function Tests: Recent Labs    01/08/24 2148  AST 32  ALT 25  ALKPHOS 74  BILITOT 0.4  PROT 6.9  ALBUMIN 4.4   Recent Labs    01/08/24 2148  LIPASE 23   CBC: Recent Labs    01/08/24 2148 01/10/24 0506  WBC 10.9* 8.5  HGB 13.1 13.6  HCT 37.2* 38.2*  MCV 91.0 90.1  PLT 259 221   Cardiac Enzymes: No results for input(s): CKTOTAL, CKMB, CKMBINDEX, TROPONINIHS in the last 72 hours. BNP: No results for input(s): BNP in the last 72 hours. D-Dimer: No results for input(s): DDIMER in the last 72 hours. Hemoglobin A1C: No results for input(s): HGBA1C in the last 72 hours. Fasting Lipid Panel: No results for input(s): CHOL, HDL, LDLCALC, TRIG, CHOLHDL, LDLDIRECT in the last 72 hours. Thyroid  Function Tests: No results for input(s): TSH, T4TOTAL, T3FREE, THYROIDAB in the last 72 hours.  Invalid input(s): FREET3 Anemia Panel: No results for input(s): VITAMINB12, FOLATE, FERRITIN, TIBC, IRON, RETICCTPCT in the last 72 hours.   Radiology: DG Chest Portable 1 View Result Date: 01/08/2024 EXAM: 1 VIEW(S) XRAY OF THE CHEST 01/08/2024 10:43:00 PM COMPARISON: Comparison study 01/02/2021. CLINICAL HISTORY: chest pain FINDINGS: LUNGS AND PLEURA: The lungs are clear bilaterally. No pleural effusion. No pneumothorax. HEART AND MEDIASTINUM: The cardiac shadow is stable. BONES AND SOFT TISSUES: Left shoulder replacement is noted. No acute osseous abnormality. IMPRESSION: 1. No acute cardiopulmonary pathology. Electronically signed by: Oneil Devonshire MD 01/08/2024 10:51 PM EST RP Workstation: [id number]    ECHO pending  TELEMETRY (personally  reviewed): Atrial fibrillation rate 110s  EKG (personally reviewed): Ventricular tachycardia rate 191 bpm  Data reviewed by me 01/10/2024: last 24h vitals tele labs imaging I/O ED provider note, admission H&P, hospitalist progress note  Principal Problem:   Wide-complex tachycardia Active Problems:   History of CVA (cerebrovascular accident)   Benign prostatic hyperplasia without lower urinary tract symptoms   Essential hypertension   Venous insufficiency    ASSESSMENT AND PLAN:  Timothy Wells is a 73 y.o. male  with a past medical history of persistent atrial fibrillation s/p ablations 07/16/2020 and 07/16/2021, history of CVA, hypertension, hyperlipidemia, type 2 diabetes who presented to the ED on 01/08/2024 for emesis, generalized weakness.  Wide-complex tachycardia noted on initial EKG in the ED.  Cardiology was consulted for further evaluation.   # Ventricular tachycardia # Demand ischemia # Persistent atrial fibrillation # Hypertension Patient presented to the ED after having sudden onset of generalized whole body discomfort and weakness with associated episode of vomiting.  Initial EKG in the ED with wide-complex tachycardia heart rate 191 bpm.  He was normotensive and mentating well so he was started on IV amiodarone .  Troponins mildly elevated and trended 153 > 218. - Echo pending.  Further recommendations pending these results - Continue IV amiodarone . - Increase metoprolol  to 37.5 mg every 6 hours.  Will consolidate as able. - Switch to IV heparin  for anticoagulation.  Plan to resume Eliquis  after heart catheterization. - Continue atorvastatin  80 mg daily. - Suspect mild troponin elevation  most consistent with demand/supply mismatch and not ACS as he is without chest pain however given his wide-complex tachycardia on admission we will plan to set him up for [facility] tomorrow for additional evaluation.   This patient's plan of care was discussed and created with Dr. Ammon and  he is in agreement.  Signed: Danita Bloch, PA-C  01/10/2024, 8:46 AM [facility]

## 2024-01-10 NOTE — Consult Note (Signed)
 PHARMACY - ANTICOAGULATION CONSULT NOTE  Pharmacy Consult for heparin  dosing Indication: atrial fibrillation  No Known Allergies  Patient Measurements: Height: 5' 7 (170.2 cm) Weight: 67.7 kg (149 lb 4 oz) IBW/kg (Calculated) : 66.1 HEPARIN  DW (KG): 67.7  Vital Signs: Temp: 99.6 F (37.6 C) (11/13 0800) Temp Source: Oral (11/13 0800) BP: 89/67 (11/13 1100) Pulse Rate: 104 (11/13 1100)  Labs: Recent Labs    01/08/24 2148 01/09/24 1109 01/10/24 0506 01/10/24 0909  HGB 13.1  --  13.6  --   HCT 37.2*  --  38.2*  --   PLT 259  --  221  --   APTT  --   --   --  32  LABPROT  --   --   --  14.8  INR  --   --   --  1.1  HEPARINUNFRC  --   --   --  >1.10*  CREATININE 0.63 0.55* 0.68  --     Estimated Creatinine Clearance: 79.2 mL/min (by C-G formula based on SCr of 0.68 mg/dL).   Medical History: Past Medical History:  Diagnosis Date   A-fib [facility])    Dysrhythmia    wide complex tachycardia   Edema of both lower legs    GERD (gastroesophageal reflux disease)    Memory loss    Restless leg syndrome    Skin cancer    skin cancer face basal cell   Stroke (HCC) 2019   Type 2 diabetes mellitus without complication, without long-term current use of insulin (HCC) 04/10/2019    Medications:  PTA apixaban  5mg  bid - last dose 2143 on 11/12  Assessment: Timothy Wells is a 59 yoM that has presented with ventricular tachycardia conversion to atrial fibrillation with RVR. Past medical history notable for atrial flutter s/p ablation on Eliquis , previously on amiodarone , prior CVA, and venous insufficiency.   Goal of Therapy:  Heparin  level 0.3-0.7 units/ml aPTT 66-102 seconds Monitor platelets by anticoagulation protocol: Yes   Baseline aPTT 32, HL >1.10, INR 1.1. Hgb and plt stable.  Plan:  Will not bolus due to last apixaban  dose at 2143 on 11/12 Start heparin  infusion at 950 units/hr Ordered baseline INR, aPTT, HL Check aPTT in 8 hours, and continue to monitor aPTT  daily until correlation with HL Continue to monitor H&H and platelets   Timothy Wells Timothy Wells 01/10/2024,11:39 AM

## 2024-01-10 NOTE — Progress Notes (Signed)
 PROGRESS NOTE    Timothy Wells  FMW:[id number] DOB: Jun 24, 1950 DOA: 01/08/2024 PCP: Steva Clotilda DEL, NP  Chief Complaint  Patient presents with   Emesis    Hospital Course:  Timothy Wells is a 73 y.o. male with medical history significant for Atrial flutter s/p ablation on Eliquis , previously on amiodarone , prior CVA, HTN,  venous insufficiency, last seen by cardiology in September 2025 being admitted with sustained V. tach currently on amiodarone  with which he converted to A-fib. Seen by cardiology, Hospital course as below  Subjective: Patient was examined at the bedside, daughter Timothy Wells present at the bedside States he feels much better today, denies any complaints on my exam  Later RN paged that patient had heartburn, takes Prilosec at home. Will give PPI, Maalox prn   Objective: Vitals:   01/10/24 1400 01/10/24 1500 01/10/24 1600 01/10/24 1638  BP: 106/75 92/69 99/76    Pulse: (!) 111 77 (!) 124 (!) 130  Resp: 14 (!) 22 17 16   Temp:   98.2 F (36.8 C)   TempSrc:   Oral   SpO2: 99% 99% 97% 97%  Weight:      Height:        Intake/Output Summary (Last 24 hours) at 01/10/2024 1812 Last data filed at 01/10/2024 1200 Gross per 24 hour  Intake 1617.19 ml  Output 2100 ml  Net -482.81 ml   Filed Weights   01/09/24 1100 01/09/24 1627  Weight: 67.7 kg 67.7 kg    Examination: Constitutional:      General: He is not in acute distress. HENT:     Head: Normocephalic and atraumatic.  Cardiovascular:     Rate and Rhythm: Tachycardia present. Rhythm irregular.     Heart sounds: Normal heart sounds.  Pulmonary:     Effort: Pulmonary effort is normal.     Breath sounds: Normal breath sounds.  Abdominal:     Palpations: Abdomen is soft.     Tenderness: There is no abdominal tenderness.  Neurological:     Mental Status: Mental status is at baseline   Assessment & Plan:  Ventricular tachycardia Persistent Atrial fibrillation with RVR with history of ablation -  Patient converted to A-fib with RVR with amiodarone  - Elevated Troponin likely due to demand ischemia, BNP elevated - Continue amiodarone  infusion for rate control - Increase metoprolol  to 37.5 mg q6 hr. One dose digoxin given - Start IV heparin  for anticoagulation, plan to resume Eliquis  after LHC - Echo EF 55 to 60%.  No RWMA.  Diastolic parameters indeterminate.  Mild MR.  Mild AR. - Seen by Cardiology, appreciate recs. Plan for [facility] tomorrow given his wide complex tachycardia - Monitor and replete lytes as needed   Mild hyponatremia, improving - Unclear etiology - Monitor sodium  Essential hypertension - Will hold lisinopril, resume as BP tolerates   History of CVA (cerebrovascular accident) - Continue statin and antiplatelet   Venous insufficiency - No acute issues   Benign prostatic hyperplasia without lower urinary tract symptoms - Continue Myrbetriq, Flomax  DVT prophylaxis: Eliquis    Code Status: Full Code Disposition:  TBD  Consultants:  Treatment Team:  Consulting Physician: Ammon Blunt, MD  Procedures:  LHC tomorrow  Antimicrobials:  Anti-infectives (From admission, onward)    None       Data Reviewed: I have personally reviewed following labs and imaging studies CBC: Recent Labs  Lab 01/08/24 2148 01/10/24 0506  WBC 10.9* 8.5  HGB 13.1 13.6  HCT 37.2* 38.2*  MCV 91.0  90.1  PLT 259 221   Basic Metabolic Panel: Recent Labs  Lab 01/08/24 2148 01/09/24 0011 01/09/24 1109 01/10/24 0506  NA 129*  --  130* 131*  K 3.8  --  4.0 4.1  CL 91*  --  98 97*  CO2 26  --  24 27  GLUCOSE 147*  --  102* 91  BUN 18  --  8 11  CREATININE 0.63  --  0.55* 0.68  CALCIUM  8.6*  --  8.4* 8.7*  MG  --  1.8  --  2.1   GFR: Estimated Creatinine Clearance: 79.2 mL/min (by C-G formula based on SCr of 0.68 mg/dL). Liver Function Tests: Recent Labs  Lab 01/08/24 2148  AST 32  ALT 25  ALKPHOS 74  BILITOT 0.4  PROT 6.9  ALBUMIN 4.4    CBG: Recent Labs  Lab 01/09/24 1040  GLUCAP 101*    Recent Results (from the past 240 hours)  MRSA Next Gen by PCR, Nasal     Status: Abnormal   Collection Time: 01/09/24 11:20 AM   Specimen: Nasal Mucosa; Nasal Swab  Result Value Ref Range Status   MRSA by PCR Next Gen DETECTED (A) NOT DETECTED Final    Comment: RESULT CALLED TO, READ BACK BY AND VERIFIED WITH: RANKIN ARRANT RN @1353  01/09/2024 KC (NOTE) The GeneXpert MRSA Assay (FDA approved for NASAL specimens only), is one component of a comprehensive MRSA colonization surveillance program. It is not intended to diagnose MRSA infection nor to guide or monitor treatment for MRSA infections. Test performance is not FDA approved in patients less than 3 years old. Performed at [facility], [address], [place], [place] [address]      Radiology Studies: ECHOCARDIOGRAM COMPLETE Result Date: 01/10/2024    ECHOCARDIOGRAM REPORT   Patient Name:   Timothy Wells Date of Exam: 01/09/2024 Medical Rec #:  [id number]       Height:       67.0 in Accession #:    [id number]      Weight:       149.3 lb Date of Birth:  November 14, 1950       BSA:          1.786 m Patient Age:    73 years        BP:           101/76 mmHg Patient Gender: M               HR:           115 bpm. Exam Location:  [facility] Procedure: 2D Echo, Color Doppler and Cardiac Doppler (Both Spectral and Color            Flow Doppler were utilized during procedure). Indications:     Ventriculat tachycardia I47.2  History:         Patient has prior history of Echocardiogram examinations, most                  recent 07/03/2020. Stroke, Arrythmias:Atrial Fibrillation; Risk                  Factors:Diabetes.  Sonographer:     Christopher Furnace Referring Phys:  [id number] DELAYNE LULLA SOLIAN Diagnosing Phys: Marsa Dooms MD IMPRESSIONS  1. Left ventricular ejection fraction, by estimation, is 55 to 60%. The left ventricle has normal function. The left ventricle has no regional wall motion  abnormalities. Left ventricular diastolic parameters are indeterminate.  2. Right ventricular systolic  function is normal. The right ventricular size is normal.  3. The mitral valve is normal in structure. Mild mitral valve regurgitation. No evidence of mitral stenosis.  4. The aortic valve is normal in structure. Aortic valve regurgitation is mild. No aortic stenosis is present.  5. The inferior vena cava is normal in size with greater than 50% respiratory variability, suggesting right atrial pressure of 3 mmHg. FINDINGS  Left Ventricle: Left ventricular ejection fraction, by estimation, is 55 to 60%. The left ventricle has normal function. The left ventricle has no regional wall motion abnormalities. Strain was performed and the global longitudinal strain is indeterminate. The left ventricular internal cavity size was normal in size. There is no left ventricular hypertrophy. Left ventricular diastolic parameters are indeterminate. Right Ventricle: The right ventricular size is normal. No increase in right ventricular wall thickness. Right ventricular systolic function is normal. Left Atrium: Left atrial size was normal in size. Right Atrium: Right atrial size was normal in size. Pericardium: There is no evidence of pericardial effusion. Mitral Valve: The mitral valve is normal in structure. Mild mitral valve regurgitation. No evidence of mitral valve stenosis. Tricuspid Valve: The tricuspid valve is normal in structure. Tricuspid valve regurgitation is mild . No evidence of tricuspid stenosis. Aortic Valve: The aortic valve is normal in structure. Aortic valve regurgitation is mild. No aortic stenosis is present. Aortic valve mean gradient measures 2.0 mmHg. Aortic valve peak gradient measures 3.2 mmHg. Aortic valve area, by VTI measures 2.97 cm. Pulmonic Valve: The pulmonic valve was normal in structure. Pulmonic valve regurgitation is not visualized. No evidence of pulmonic stenosis. Aorta: The aortic root is  normal in size and structure. Venous: The inferior vena cava is normal in size with greater than 50% respiratory variability, suggesting right atrial pressure of 3 mmHg. IAS/Shunts: No atrial level shunt detected by color flow Doppler. Additional Comments: 3D was performed not requiring image post processing on an independent workstation and was indeterminate.  LEFT VENTRICLE PLAX 2D LVIDd:         3.18 cm   Diastology LVIDs:         2.18 cm   LV e' medial:    4.35 cm/s LV PW:         1.12 cm   LV E/e' medial:  20.5 LV IVS:        2.03 cm   LV e' lateral:   4.54 cm/s LVOT diam:     2.00 cm   LV E/e' lateral: 19.6 LV SV:         39 LV SV Index:   22 LVOT Area:     3.14 cm LV IVRT:       117 msec  RIGHT VENTRICLE RV Basal diam:  3.75 cm RV Mid diam:    2.66 cm LEFT ATRIUM             Index        RIGHT ATRIUM           Index LA diam:        3.30 cm 1.85 cm/m   RA Area:     11.50 cm LA Vol (A2C):   15.3 ml 8.57 ml/m   RA Volume:   26.80 ml  15.01 ml/m LA Vol (A4C):   25.1 ml 14.06 ml/m LA Biplane Vol: 21.0 ml 11.76 ml/m  AORTIC VALVE AV Area (Vmax):    2.61 cm AV Area (Vmean):   2.58 cm AV Area (VTI):  2.97 cm AV Vmax:           89.00 cm/s AV Vmean:          61.100 cm/s AV VTI:            0.132 m AV Peak Grad:      3.2 mmHg AV Mean Grad:      2.0 mmHg LVOT Vmax:         73.90 cm/s LVOT Vmean:        50.100 cm/s LVOT VTI:          0.125 m LVOT/AV VTI ratio: 0.95  AORTA Ao Root diam: 2.70 cm MITRAL VALVE               TRICUSPID VALVE MV Area (PHT): 5.46 cm    TR Peak grad:   7.2 mmHg MV Decel Time: 139 msec    TR Vmax:        134.00 cm/s MV E velocity: 89.20 cm/s                            SHUNTS                            Systemic VTI:  0.12 m                            Systemic Diam: 2.00 cm Marsa Dooms MD Electronically signed by Marsa Dooms MD Signature Date/Time: 01/10/2024/10:02:44 AM    Final    DG Chest Portable 1 View Result Date: 01/08/2024 EXAM: 1 VIEW(S) XRAY OF THE CHEST  01/08/2024 10:43:00 PM COMPARISON: Comparison study 01/02/2021. CLINICAL HISTORY: chest pain FINDINGS: LUNGS AND PLEURA: The lungs are clear bilaterally. No pleural effusion. No pneumothorax. HEART AND MEDIASTINUM: The cardiac shadow is stable. BONES AND SOFT TISSUES: Left shoulder replacement is noted. No acute osseous abnormality. IMPRESSION: 1. No acute cardiopulmonary pathology. Electronically signed by: Oneil Devonshire MD 01/08/2024 10:51 PM EST RP Workstation: [id number]    Scheduled Meds:  atorvastatin   80 mg Oral QPM   Chlorhexidine  Gluconate Cloth  6 each Topical Daily   magnesium  oxide  400 mg Oral Daily   metoprolol  tartrate  37.5 mg Oral Q6H   mirabegron ER  25 mg Oral Daily   pantoprazole   40 mg Oral Daily   tamsulosin  0.4 mg Oral QPC supper   Continuous Infusions:  amiodarone  30 mg/hr (01/10/24 1550)   heparin  950 Units/hr (01/10/24 1200)   promethazine (PHENERGAN) injection (IM or IVPB)       LOS: 1 day  I personally spent a total of 50 minutes in the care of the patient today including performing a medically appropriate exam/evaluation, counseling and educating, placing orders, documenting clinical information in the EHR, and communicating results.    Laree Lock, MD [facility]  To contact the attending physician between 7A-7P please use Epic Chat. To contact the covering physician during after hours 7P-7A, please review Amion.  01/10/2024, 6:12 PM   *This document has been created with the assistance of dictation software. Please excuse typographical errors. *

## 2024-01-11 ENCOUNTER — Encounter: Admission: EM | Disposition: A | Discharge status: Home Health | Payer: Self-pay | Source: Home / Self Care

## 2024-01-11 DIAGNOSIS — R000 Tachycardia, unspecified: Secondary | ICD-10-CM | POA: Diagnosis not present

## 2024-01-11 HISTORY — PX: LEFT HEART CATH AND CORONARY ANGIOGRAPHY: CATH118249

## 2024-01-11 LAB — HEPARIN LEVEL (UNFRACTIONATED): Heparin Unfractionated: 0.81 [IU]/mL — ABNORMAL HIGH (ref 0.30–0.70)

## 2024-01-11 LAB — BASIC METABOLIC PANEL WITH GFR
Anion gap: 9 (ref 5–15)
BUN: 12 mg/dL (ref 8–23)
CO2: 24 mmol/L (ref 22–32)
Calcium: 8.3 mg/dL — ABNORMAL LOW (ref 8.9–10.3)
Chloride: 97 mmol/L — ABNORMAL LOW (ref 98–111)
Creatinine, Ser: 0.7 mg/dL (ref 0.61–1.24)
GFR, Estimated: >60 mL/min (ref >60)
Glucose, Bld: 95 mg/dL (ref 70–99)
Potassium: 3.9 mmol/L (ref 3.5–5.1)
Sodium: 130 mmol/L — ABNORMAL LOW (ref 135–145)

## 2024-01-11 LAB — CBC
HCT: 38.3 % — ABNORMAL LOW (ref 39.0–52.0)
Hemoglobin: 13.7 g/dL (ref 13.0–17.0)
MCH: 31.9 pg (ref 26.0–34.0)
MCHC: 35.8 g/dL (ref 30.0–36.0)
MCV: 89.3 fL (ref 80.0–100.0)
Platelets: 215 K/uL (ref 150–400)
RBC: 4.29 MIL/uL (ref 4.22–5.81)
RDW: 13 % (ref 11.5–15.5)
WBC: 8.7 K/uL (ref 4.0–10.5)
nRBC: 0 % (ref 0.0–0.2)

## 2024-01-11 LAB — OSMOLALITY, URINE: Osmolality, Ur: 413 mosm/kg (ref 300–900)

## 2024-01-11 LAB — SODIUM, URINE, RANDOM: Sodium, Ur: 44 mmol/L

## 2024-01-11 LAB — OSMOLALITY: Osmolality: 272 mosm/kg — ABNORMAL LOW (ref 275–295)

## 2024-01-11 LAB — APTT: aPTT: 92 s — ABNORMAL HIGH (ref 24–36)

## 2024-01-11 SURGERY — LEFT HEART CATH AND CORONARY ANGIOGRAPHY
Anesthesia: Moderate Sedation

## 2024-01-11 MED ORDER — MIDAZOLAM HCL 2 MG/2ML IJ SOLN
INTRAMUSCULAR | Status: AC
  Filled 2024-01-11: qty 2

## 2024-01-11 MED ORDER — FREE WATER: 500.0000 mL | Freq: Once | Status: DC

## 2024-01-11 MED ORDER — SODIUM CHLORIDE 1 G PO TABS
1.0000 g | ORAL_TABLET | Freq: Three times a day (TID) | ORAL | Status: DC
  Filled 2024-01-11: qty 1

## 2024-01-11 MED ORDER — HEPARIN (PORCINE) IN NACL 1000-0.9 UT/500ML-% IV SOLN
INTRAVENOUS | Status: AC
Start: 2024-01-11 — End: 2024-01-11
  Filled 2024-01-11: qty 1000

## 2024-01-11 MED ORDER — ACETAMINOPHEN 325 MG PO TABS: 650.0000 mg | ORAL_TABLET | ORAL | Status: DC | PRN

## 2024-01-11 MED ORDER — FENTANYL CITRATE (PF) 100 MCG/2ML IJ SOLN
INTRAMUSCULAR | Status: DC | PRN
  Administered 2024-01-11: 25 ug via INTRAVENOUS

## 2024-01-11 MED ORDER — MIDAZOLAM HCL (PF) 2 MG/2ML IJ SOLN
INTRAMUSCULAR | Status: DC | PRN
  Administered 2024-01-11: 1 mg via INTRAVENOUS

## 2024-01-11 MED ORDER — HEPARIN SODIUM (PORCINE) 1000 UNIT/ML IJ SOLN
INTRAMUSCULAR | Status: DC | PRN
  Administered 2024-01-11: 3500 [IU] via INTRAVENOUS

## 2024-01-11 MED ORDER — FENTANYL CITRATE (PF) 100 MCG/2ML IJ SOLN
INTRAMUSCULAR | Status: AC
  Filled 2024-01-11: qty 2

## 2024-01-11 MED ORDER — ONDANSETRON HCL 4 MG/2ML IJ SOLN: 4.0000 mg | Freq: Four times a day (QID) | INTRAMUSCULAR | Status: DC | PRN

## 2024-01-11 MED ORDER — SODIUM CHLORIDE 0.9% FLUSH: 3.0000 mL | INTRAVENOUS | Status: DC | PRN

## 2024-01-11 MED ORDER — METOPROLOL TARTRATE 50 MG PO TABS
50.0000 mg | ORAL_TABLET | Freq: Two times a day (BID) | ORAL | Status: DC
  Administered 2024-01-11 – 2024-01-12 (×2): 50 mg via ORAL
  Filled 2024-01-11 (×2): qty 1

## 2024-01-11 MED ORDER — HYDRALAZINE HCL 20 MG/ML IJ SOLN: 10.0000 mg | INTRAMUSCULAR | Status: AC | PRN

## 2024-01-11 MED ORDER — VERAPAMIL HCL 2.5 MG/ML IV SOLN
INTRAVENOUS | Status: DC | PRN
  Administered 2024-01-11: 2.5 mg via INTRA_ARTERIAL

## 2024-01-11 MED ORDER — ASPIRIN 81 MG PO CHEW
81.0000 mg | CHEWABLE_TABLET | Freq: Once | ORAL | Status: AC
  Administered 2024-01-11: 81 mg via ORAL

## 2024-01-11 MED ORDER — SODIUM CHLORIDE 0.9 % IV SOLN: 250.0000 mL | INTRAVENOUS | Status: AC | PRN

## 2024-01-11 MED ORDER — SODIUM CHLORIDE 0.9% FLUSH
3.0000 mL | Freq: Two times a day (BID) | INTRAVENOUS | Status: DC
  Administered 2024-01-11 – 2024-01-12 (×3): 3 mL via INTRAVENOUS

## 2024-01-11 MED ORDER — ASPIRIN 81 MG PO CHEW
CHEWABLE_TABLET | ORAL | Status: AC
  Filled 2024-01-11: qty 1

## 2024-01-11 MED ORDER — VERAPAMIL HCL 2.5 MG/ML IV SOLN
INTRAVENOUS | Status: AC
Start: 2024-01-11 — End: 2024-01-11
  Filled 2024-01-11: qty 2

## 2024-01-11 MED ORDER — SODIUM CHLORIDE 1 G PO TABS
1.0000 g | ORAL_TABLET | Freq: Two times a day (BID) | ORAL | Status: DC
  Administered 2024-01-11 – 2024-01-12 (×2): 1 g via ORAL
  Filled 2024-01-11 (×3): qty 1

## 2024-01-11 MED ORDER — APIXABAN 5 MG PO TABS
5.0000 mg | ORAL_TABLET | Freq: Two times a day (BID) | ORAL | Status: DC
  Administered 2024-01-12: 5 mg via ORAL
  Filled 2024-01-11: qty 1

## 2024-01-11 MED ORDER — HEPARIN (PORCINE) IN NACL 1000-0.9 UT/500ML-% IV SOLN
INTRAVENOUS | Status: DC | PRN
  Administered 2024-01-11 (×2): 500 mL

## 2024-01-11 MED ORDER — LIDOCAINE HCL 1 % IJ SOLN
INTRAMUSCULAR | Status: AC
  Filled 2024-01-11: qty 20

## 2024-01-11 MED ORDER — IOHEXOL 300 MG/ML  SOLN
INTRAMUSCULAR | Status: DC | PRN
  Administered 2024-01-11: 72 mL

## 2024-01-11 MED ORDER — LIDOCAINE HCL (PF) 1 % IJ SOLN
INTRAMUSCULAR | Status: DC | PRN
  Administered 2024-01-11: 5 mL

## 2024-01-11 MED ORDER — HEPARIN SODIUM (PORCINE) 1000 UNIT/ML IJ SOLN
INTRAMUSCULAR | Status: AC
  Filled 2024-01-11: qty 10

## 2024-01-11 SURGICAL SUPPLY — 9 items
CATH INFINITI 5 FR JL3.5 (CATHETERS) IMPLANT
CATH INFINITI JR4 5F (CATHETERS) IMPLANT
DEVICE RAD TR BAND REGULAR (VASCULAR PRODUCTS) IMPLANT
DRAPE BRACHIAL (DRAPES) IMPLANT
GLIDESHEATH SLEND SS 6F .021 (SHEATH) IMPLANT
GUIDEWIRE INQWIRE 1.5J.035X260 (WIRE) IMPLANT
PACK CARDIAC CATH (CUSTOM PROCEDURE TRAY) ×1 IMPLANT
SET ATX-X65L (MISCELLANEOUS) IMPLANT
STATION PROTECTION PRESSURIZED (MISCELLANEOUS) IMPLANT

## 2024-01-11 NOTE — Plan of Care (Signed)
  Problem: Education: Goal: Knowledge of disease or condition will improve Outcome: Progressing Goal: Understanding of medication regimen will improve Outcome: Progressing   Problem: Activity: Goal: Ability to tolerate increased activity will improve Outcome: Progressing   Problem: Cardiac: Goal: Ability to achieve and maintain adequate cardiopulmonary perfusion will improve Outcome: Progressing   Problem: Health Behavior/Discharge Planning: Goal: Ability to safely manage health-related needs after discharge will improve Outcome: Progressing   Problem: Clinical Measurements: Goal: Ability to maintain clinical measurements within normal limits will improve Outcome: Progressing Goal: Will remain free from infection Outcome: Progressing   Problem: Coping: Goal: Level of anxiety will decrease Outcome: Progressing

## 2024-01-11 NOTE — TOC Initial Note (Signed)
 Transition of Care [facility]) - Initial/Assessment Note    Patient Details  Name: Timothy Wells MRN: [id number] Date of Birth: October 15, 1950  Transition of Care [facility]) CM/SW Contact:    Corrie JINNY Ruts, LCSW Phone Number: 01/11/2024, 10:31 AM  Clinical Narrative:                 Chart reviewed. The patient was admitted for wide complex trachycardia. I was able to speak with the patient and his daughter art bedside today. I introduced myself, my role, and reason for consult. The patient redirected me to his daughter to complete consult but patient answered the questions.   The patient confirmed that he has a PCP. The patient reports that he lives by himself. The patient reports that he completed daily living task independently and drives him self to medical appointments. The patient reports that his daughter will assist him during D/C. The patient reports that he uses psychologist, forensic.  The patient reports that he has never had HH or been admitted into a SNF in the past. The patient reports that he has no equipment in the home.   The patient did not report any concerns during the time of the assessment.         Patient Goals and CMS Choice            Expected Discharge Plan and Services                                              Prior Living Arrangements/Services                       Activities of Daily Living   ADL Screening (condition at time of admission) Independently performs ADLs?: Yes (appropriate for developmental age)  Permission Sought/Granted                  Emotional Assessment              Admission diagnosis:  Palpitations [R00.2] Wide-complex tachycardia [R00.0] Nausea and vomiting, unspecified vomiting type [R11.2] Patient Active Problem List   Diagnosis Date Noted   Essential hypertension 01/09/2024   Venous insufficiency 01/09/2024   Persistent atrial fibrillation (HCC) 12/15/2020   Atypical atrial flutter (HCC)  11/17/2020   Secondary hypercoagulable state 11/17/2020   Benign prostatic hyperplasia without lower urinary tract symptoms 10/10/2019   Dizziness    Wide-complex tachycardia 01/21/2019   Hypotension 01/21/2019   History of CVA (cerebrovascular accident) 01/21/2019   PCP:  Steva Clotilda DEL, NP Pharmacy:   [facility] [address] [place] - [address] [place] [address] Phone: [phone] Fax: [phone]     Social Drivers of Health (SDOH) Social History: SDOH Screenings   Food Insecurity: No Food Insecurity (01/09/2024)  Housing: Low Risk  (01/09/2024)  Transportation Needs: No Transportation Needs (01/09/2024)  Utilities: Not At Risk (01/09/2024)  Financial Resource Strain: Low Risk  (11/02/2023)   Received from [facility]  Physical Activity: Sufficiently Active (11/02/2023)   Received from [facility]  Social Connections: Moderately Isolated (01/09/2024)  Stress: No Stress Concern Present (11/02/2023)   Received [facility]  Tobacco Use: Low Risk  (11/15/2023)   Received from [facility]  Health Literacy: Adequate Health Literacy (11/02/2023)   Received from  [facility]   SDOH Interventions:     Readmission Risk Interventions     No data to display

## 2024-01-11 NOTE — TOC Progression Note (Signed)
 Transition of Care [facility] - Progression Note    Patient Details  Name: Timothy Wells MRN: [id number] Date of Birth: May 09, 1950  Transition of Care [facility]) CM/SW Contact  Melquan Ernsberger L Jonie Burdell, [place] Phone Number: 01/11/2024, 12:53 PM  Clinical Narrative:     Life Vest ordered through [facility]. CSW spoke with Deatrice Rutter and provided the necessary documentation to process order.                     Expected Discharge Plan and Services                                               Social Drivers of Health (SDOH) Interventions SDOH Screenings   Food Insecurity: No Food Insecurity (01/09/2024)  Housing: Low Risk  (01/09/2024)  Transportation Needs: No Transportation Needs (01/09/2024)  Utilities: Not At Risk (01/09/2024)  Financial Resource Strain: Low Risk  (11/02/2023)   Received from [facility]  Physical Activity: Sufficiently Active (11/02/2023)   Received from [facility]  Social Connections: Moderately Isolated (01/09/2024)  Stress: No Stress Concern Present (11/02/2023)   Received from [facility]  Tobacco Use: Low Risk  (11/15/2023)   Received from [facility]  Health Literacy: Adequate Health Literacy (11/02/2023)   Received from [facility]    Readmission Risk Interventions     No data to display

## 2024-01-11 NOTE — Progress Notes (Signed)
 PROGRESS NOTE    Timothy Wells  FMW:[id number] DOB: [birth year] DOA: 01/08/2024 PCP: Steva Clotilda DEL, NP  Chief Complaint  Patient presents with   Emesis    Hospital Course:  Timothy Wells is a 73 y.o. male with medical history significant for Atrial flutter s/p ablation on Eliquis , previously on amiodarone , prior CVA, HTN,  venous insufficiency, last seen by cardiology in September 2025 being admitted with sustained V. tach currently on amiodarone  with which he converted to A-fib. Seen by cardiology, Hospital course as below  Subjective: Patient was examined at the bedside, daughter Timothy Wells present at the bedside States he feels much better today, denies any complaints on my exam Underwent LHC today   Objective: Vitals:   01/11/24 1700 01/11/24 1800 01/11/24 1807 01/11/24 1900  BP: 91/69 (!) 74/64 104/82 109/77  Pulse: (!) 102 88 85 82  Resp: 17 16 15 15   Temp:  97.6 F (36.4 C)    TempSrc:  Oral    SpO2: 100% 97% 97% 100%  Weight:      Height:        Intake/Output Summary (Last 24 hours) at 01/11/2024 1933 Last data filed at 01/11/2024 1900 Gross per 24 hour  Intake 572.52 ml  Output 2000 ml  Net -1427.48 ml   Filed Weights   01/09/24 1100 01/09/24 1627  Weight: 67.7 kg 67.7 kg    Examination: Constitutional:      General: He is not in acute distress. HENT:     Head: Normocephalic and atraumatic.  Cardiovascular:     Rate and Rhythm: RRR    Heart sounds: Normal heart sounds.  Pulmonary:     Effort: Pulmonary effort is normal.     Breath sounds: Normal breath sounds.  Abdominal:     Palpations: Abdomen is soft.     Tenderness: There is no abdominal tenderness.  Neurological:     Mental Status: Mental status is at baseline   Assessment & Plan:  Ventricular tachycardia Persistent Atrial fibrillation with RVR with history of ablation - Patient converted to A-fib with RVR with amiodarone  - Elevated Troponin likely due to demand ischemia, BNP  elevated.  LHC non obstructive - Continue amiodarone  infusion for rate control - On metoprolol  50 bid - resumed Eliquis  after LHC, stop IV heparin  - Echo EF 55 to 60%.  No RWMA.  Diastolic parameters indeterminate.  Mild MR.  Mild AR. - Seen by Cardiology, appreciate recs. - Plan to discharge home with LifeVest, follow-up with EP outpatient - Monitor and replete lytes as needed   Mild hyponatremia, improving - serum osm hypotonic, urine osm elevated, urine sodium - salt tabs 1g bid - Monitor sodium  Essential hypertension - Will hold lisinopril, resume as BP tolerates   History of CVA (cerebrovascular accident) - Continue statin and antiplatelet   Venous insufficiency - No acute issues   Benign prostatic hyperplasia without lower urinary tract symptoms - Continue Myrbetriq, Flomax  DVT prophylaxis: Eliquis    Code Status: Full Code Disposition:  TBD  Consultants:  Treatment Team:  Consulting Physician: Ammon Blunt, MD  Procedures:  LHC 11/14  Antimicrobials:  Anti-infectives (From admission, onward)    None       Data Reviewed: I have personally reviewed following labs and imaging studies CBC: Recent Labs  Lab 01/08/24 2148 01/10/24 0506 01/11/24 0144  WBC 10.9* 8.5 8.7  HGB 13.1 13.6 13.7  HCT 37.2* 38.2* 38.3*  MCV 91.0 90.1 89.3  PLT 259 221 215  Basic Metabolic Panel: Recent Labs  Lab 01/08/24 2148 01/09/24 0011 01/09/24 1109 01/10/24 0506 01/11/24 0144  NA 129*  --  130* 131* 130*  K 3.8  --  4.0 4.1 3.9  CL 91*  --  98 97* 97*  CO2 26  --  24 27 24   GLUCOSE 147*  --  102* 91 95  BUN 18  --  8 11 12   CREATININE 0.63  --  0.55* 0.68 0.70  CALCIUM  8.6*  --  8.4* 8.7* 8.3*  MG  --  1.8  --  2.1  --    GFR: Estimated Creatinine Clearance: 79.2 mL/min (by C-G formula based on SCr of 0.7 mg/dL). Liver Function Tests: Recent Labs  Lab 01/08/24 2148  AST 32  ALT 25  ALKPHOS 74  BILITOT 0.4  PROT 6.9  ALBUMIN 4.4    CBG: Recent Labs  Lab 01/09/24 1040  GLUCAP 101*    Recent Results (from the past 240 hours)  MRSA Next Gen by PCR, Nasal     Status: Abnormal   Collection Time: 01/09/24 11:20 AM   Specimen: Nasal Mucosa; Nasal Swab  Result Value Ref Range Status   MRSA by PCR Next Gen DETECTED (A) NOT DETECTED Final    Comment: RESULT CALLED TO, READ BACK BY AND VERIFIED WITH: RANKIN ARRANT RN @1353  01/09/2024 KC (NOTE) The GeneXpert MRSA Assay (FDA approved for NASAL specimens only), is one component of a comprehensive MRSA colonization surveillance program. It is not intended to diagnose MRSA infection nor to guide or monitor treatment for MRSA infections. Test performance is not FDA approved in patients less than 27 years old. Performed at [facility], [address] [place], [place] [address]      Radiology Studies: CARDIAC CATHETERIZATION Result Date: 01/11/2024   The left ventricular systolic function is normal.   LV end diastolic pressure is normal.   The left ventricular ejection fraction is 55-65% by visual estimate. 1.  Normal coronary anatomy 2.  Normal left ventricular function Recommendations 1.  Medical therapy 2.  Resume Eliquis  for stroke prevention 3.  Discharge home with LifeVest 4.  Follow-up with [facility] as outpatient    Scheduled Meds:  [START ON 01/12/2024] apixaban   5 mg Oral BID   aspirin       atorvastatin   80 mg Oral QPM   Chlorhexidine  Gluconate Cloth  6 each Topical Daily   free water   500 mL Oral Once   magnesium  oxide  400 mg Oral Daily   metoprolol  tartrate  50 mg Oral BID   mirabegron ER  25 mg Oral Daily   pantoprazole   40 mg Oral Daily   sodium chloride  flush  3 mL Intravenous Q12H   tamsulosin  0.4 mg Oral QPC supper   Continuous Infusions:  sodium chloride      amiodarone  30 mg/hr (01/11/24 1900)   promethazine (PHENERGAN) injection (IM or IVPB)       LOS: 2 days  I personally spent a total of 50 minutes in the care of the  patient today including performing a medically appropriate exam/evaluation, counseling and educating, placing orders, documenting clinical information in the EHR, and communicating results.    Laree Lock, MD [facility]  To contact the attending physician between 7A-7P please use Epic Chat. To contact the covering physician during after hours 7P-7A, please review Amion.  01/11/2024, 7:33 PM   *This document has been created with the assistance of dictation software. Please excuse typographical errors. *

## 2024-01-11 NOTE — Progress Notes (Addendum)
 [facility] CARDIOLOGY PROGRESS NOTE       Patient ID: Timothy Wells MRN: [id number] DOB/AGE: 01-Apr-1950 73 y.o.  Admit date: 01/08/2024 Referring Physician Dr. Ozell Klein Primary Physician Steva Clotilda DEL, NP  Primary Cardiologist Dr. Florencio Reason for Consultation ventricular tachycardia  HPI: Timothy Wells is a 73 y.o. male  with a past medical history of persistent atrial fibrillation s/p ablations 07/16/2020 and 07/16/2021, history of CVA, hypertension, hyperlipidemia, type 2 diabetes who presented to the ED on 01/08/2024 for emesis, generalized weakness.  Wide-complex tachycardia noted on initial EKG in the ED.  Cardiology was consulted for further evaluation.   Interval history: - Patient seen and examined this morning, resting comfortably in hospital bed with daughter at bedside. - Heart rate elevated earlier this AM but improved in 70s.  - He remains without chest pain, shortness of breath.  No recurrence of VT on telemetry.  Review of systems complete and found to be negative unless listed above    Past Medical History:  Diagnosis Date   A-fib [facility])    Dysrhythmia    wide complex tachycardia   Edema of both lower legs    GERD (gastroesophageal reflux disease)    Memory loss    Restless leg syndrome    Skin cancer    skin cancer face basal cell   Stroke (HCC) 2019   Type 2 diabetes mellitus without complication, without long-term current use of insulin (HCC) 04/10/2019    Past Surgical History:  Procedure Laterality Date   ATRIAL FIBRILLATION ABLATION N/A 07/23/2020   Procedure: ATRIAL FIBRILLATION ABLATION;  Surgeon: Cindie Ole DASEN, MD;  Location: [facility] INVASIVE CV LAB;  Service: Cardiovascular;  Laterality: N/A;   ATRIAL FIBRILLATION ABLATION N/A 06/30/2021   Procedure: ATRIAL FIBRILLATION ABLATION;  Surgeon: Cindie Ole DASEN, MD;  Location: [facility] INVASIVE CV LAB;  Service: Cardiovascular;  Laterality: N/A;   CARDIOVERSION N/A 03/25/2019   Procedure:  CARDIOVERSION;  Surgeon: Florencio Cara BIRCH, MD;  Location: [facility] ORS;  Service: Cardiovascular;  Laterality: N/A;   CARDIOVERSION N/A 05/18/2020   Procedure: CARDIOVERSION;  Surgeon: Florencio Cara BIRCH, MD;  Location: [facility] ORS;  Service: Cardiovascular;  Laterality: N/A;   CARDIOVERSION N/A 01/24/2021   Procedure: CARDIOVERSION;  Surgeon: Darron Deatrice LABOR, MD;  Location: [facility] ORS;  Service: Cardiovascular;  Laterality: N/A;   COLONOSCOPY WITH PROPOFOL  N/A 09/10/2019   Procedure: COLONOSCOPY WITH PROPOFOL ;  Surgeon: Dessa Reyes LELON, MD;  Location: [facility] ENDOSCOPY;  Service: Endoscopy;  Laterality: N/A;   REVERSE SHOULDER ARTHROPLASTY Left 12/29/2021   Procedure: REVERSE SHOULDER ARTHROPLASTY;  Surgeon: Melita Drivers, MD;  Location: [facility] ORS;  Service: Orthopedics;  Laterality: Left;    SHOULDER ARTHROSCOPY WITH SUBACROMIAL DECOMPRESSION AND OPEN ROTATOR C Left 03/08/2020   Procedure: Left shoulder arthroscopic subscapularis repair and biodegradable balloon spacer placement with partial infraspinatus repair;  Surgeon: Tobie Priest, MD;  Location: [facility];  Service: Orthopedics;  Laterality: Left;   TEE WITHOUT CARDIOVERSION N/A 07/23/2020   Procedure: TRANSESOPHAGEAL ECHOCARDIOGRAM (TEE);  Surgeon: Jeffrie Oneil BROCKS, MD;  Location: [facility] ENDOSCOPY;  Service: Cardiovascular;  Laterality: N/A;    Medications Prior to Admission  Medication Sig Dispense Refill Last Dose/Taking   acetaminophen  (TYLENOL ) 500 MG tablet Take 1,000 mg by mouth every 6 (six) hours as needed for mild pain or headache.   Taking As Needed   apixaban  (ELIQUIS ) 5 MG TABS tablet Take 1 tablet by mouth twice daily 60 tablet 5 01/08/2024 Morning   atenolol (TENORMIN) 25  MG tablet Take 25 mg by mouth daily.   01/08/2024   atorvastatin  (LIPITOR ) 80 MG tablet Take 80 mg by mouth every evening.    01/07/2024   Cholecalciferol (VITAMIN D) 50 MCG (2000 UT) CAPS Take 4,000 Units by mouth daily.   01/08/2024   Cranberry 500 MG  CAPS Take 500 mg by mouth daily.   01/08/2024   cyclobenzaprine  (FLEXERIL ) 10 MG tablet Take 1 tablet (10 mg total) by mouth 3 (three) times daily as needed for muscle spasms. 30 tablet 1 Taking As Needed   diltiazem  (CARDIZEM  CD) 360 MG 24 hr capsule Take 1 capsule by mouth once daily 90 capsule 2 01/08/2024   docusate sodium (COLACE) 100 MG capsule Take 100 mg by mouth 2 (two) times daily.   01/08/2024   doxazosin (CARDURA) 1 MG tablet Take 1 mg by mouth at bedtime.   01/08/2024   Glucosamine-Chondroitin (COSAMIN DS PO) Take 1 tablet by mouth 2 (two) times daily.   01/08/2024   magnesium  oxide (MAG-OX) 400 (241.3 Mg) MG tablet Take 1 tablet (400 mg total) by mouth daily. 30 tablet 0 01/08/2024   Multiple Vitamin (MULTIVITAMIN WITH MINERALS) TABS tablet Take 1 tablet by mouth daily.   01/08/2024   MYRBETRIQ 25 MG TB24 tablet Take 25 mg by mouth daily.   01/08/2024   Omega-3 Fatty Acids (FISH OIL) 1000 MG CAPS Take 1,000 mg by mouth daily. DHA   01/08/2024   ondansetron  (ZOFRAN ) 4 MG tablet Take 1 tablet (4 mg total) by mouth every 8 (eight) hours as needed for nausea or vomiting. 10 tablet 0 Taking As Needed   oxyCODONE -acetaminophen  (PERCOCET) 5-325 MG tablet Take 1 tablet by mouth every 4 (four) hours as needed (max 6 q). 20 tablet 0 Taking As Needed   tamsulosin (FLOMAX) 0.4 MG CAPS capsule Take 0.4 mg by mouth daily after supper.   01/08/2024   TURMERIC PO Take 1,500 mg by mouth daily.   01/08/2024   vitamin B-12 (CYANOCOBALAMIN ) 1000 MCG tablet Take 1,000 mcg by mouth daily.   01/08/2024   vitamin C (ASCORBIC ACID) 500 MG tablet Take 500 mg by mouth daily.   01/08/2024   Vitamin E 450 MG (1000 UT) CAPS Take 1,000 Units by mouth daily.   01/08/2024   Zinc 50 MG TABS Take 50 mg by mouth daily.   01/08/2024   amiodarone  (PACERONE ) 100 MG tablet Take 100 mg by mouth daily.      Chromium-Cinnamon (CINNAMON PLUS CHROMIUM PO) Take 1 tablet by mouth 2 (two) times daily. 500 mg / 100 mg       furosemide (LASIX) 20 MG tablet Take 20 mg by mouth daily as needed for edema. (Patient not taking: Reported on 01/09/2024)   Not Taking   lisinopril (ZESTRIL) 10 MG tablet Take 10 mg by mouth daily. (Patient not taking: Reported on 01/09/2024)   Not Taking   OVER THE COUNTER MEDICATION Take 1 Scoop by mouth daily. Super Beets power   Unknown   rOPINIRole (REQUIP) 1 MG tablet Take 1 mg by mouth at bedtime. (Patient not taking: Reported on 01/09/2024)   Not Taking   Social History   Socioeconomic History   Marital status: Single    Spouse name: Not on file   Number of children: 2   Years of education: Not on file   Highest education level: Not on file  Occupational History   Not on file  Tobacco Use   Smoking status: Never   Smokeless  tobacco: Never   Tobacco comments:    Never smoke 07/28/21  Vaping Use   Vaping status: Never Used  Substance and Sexual Activity   Alcohol  use: Yes    Comment: rare   Drug use: Never   Sexual activity: Not on file  Other Topics Concern   Not on file  Social History Narrative   Lives by himself; daughter lives about 15 miles away.    Social Drivers of Corporate Investment Banker Strain: Low Risk  (11/02/2023)   Received from [facility]   Overall Financial Resource Strain (CARDIA)    Difficulty of Paying Living Expenses: Not hard at all  Food Insecurity: No Food Insecurity (01/09/2024)   Hunger Vital Sign    Worried About Running Out of Food in the Last Year: Never true    Ran Out of Food in the Last Year: Never true  Transportation Needs: No Transportation Needs (01/09/2024)   PRAPARE - Administrator, Civil Service (Medical): No    Lack of Transportation (Non-Medical): No  Physical Activity: Sufficiently Active (11/02/2023)   Received from [facility]   Exercise Vital Sign    On average, how many days per week do you engage in moderate to strenuous exercise (like a brisk walk)?: 5 days    On  average, how many minutes do you engage in exercise at this level?: 30 min  Stress: No Stress Concern Present (11/02/2023)   Received from [facility] Occupational [facility] - Occupational Stress Questionnaire    Feeling of Stress : Not at all  Social Connections: Moderately Isolated (01/09/2024)   Social Connection and Isolation Panel    Frequency of Communication with Friends and Family: Three times a week    Frequency of Social Gatherings with Friends and Family: Three times a week    Attends Religious Services: More than 4 times per year    Active Member of Clubs or Organizations: No    Attends Banker Meetings: Patient unable to answer    Marital Status: Widowed  Intimate Partner Violence: Not At Risk (01/09/2024)   Humiliation, Afraid, Rape, and Kick questionnaire    Fear of Current or Ex-Partner: No    Emotionally Abused: No    Physically Abused: No    Sexually Abused: No    No family history on file.   Vitals:   01/11/24 0600 01/11/24 0700 01/11/24 0720 01/11/24 0800  BP: 113/88 116/77  107/78  Pulse: 99 99 100   Resp: 15 15 19 14   Temp:    98.1 F (36.7 C)  TempSrc:    Oral  SpO2: 96% 97% 95% 95%  Weight:      Height:        PHYSICAL EXAM General: Well-appearing elderly male, well nourished, in no acute distress. HEENT: Normocephalic and atraumatic. Neck: No JVD.  Lungs: Normal respiratory effort on room air. Clear bilaterally to auscultation. No wheezes, crackles, rhonchi.  Heart: HRRR. Normal S1 and S2 without gallops or murmurs.  Abdomen: Non-distended appearing.  Msk: Normal strength and tone for age. Extremities: Warm and well perfused. No clubbing, cyanosis.  No edema.  Neuro: Alert and oriented X 3. Psych: Answers questions appropriately.   Labs: Basic Metabolic Panel: Recent Labs    01/09/24 0011 01/09/24 1109 01/10/24 0506 01/11/24 0144  NA  --    < > 131* 130*  K  --    < >  4.1 3.9  CL  --     < > 97* 97*  CO2  --    < > 27 24  GLUCOSE  --    < > 91 95  BUN  --    < > 11 12  CREATININE  --    < > 0.68 0.70  CALCIUM   --    < > 8.7* 8.3*  MG 1.8  --  2.1  --    < > = values in this interval not displayed.   Liver Function Tests: Recent Labs    01/08/24 2148  AST 32  ALT 25  ALKPHOS 74  BILITOT 0.4  PROT 6.9  ALBUMIN 4.4   Recent Labs    01/08/24 2148  LIPASE 23   CBC: Recent Labs    01/10/24 0506 01/11/24 0144  WBC 8.5 8.7  HGB 13.6 13.7  HCT 38.2* 38.3*  MCV 90.1 89.3  PLT 221 215   Cardiac Enzymes: No results for input(s): CKTOTAL, CKMB, CKMBINDEX, TROPONINIHS in the last 72 hours. BNP: No results for input(s): BNP in the last 72 hours. D-Dimer: No results for input(s): DDIMER in the last 72 hours. Hemoglobin A1C: No results for input(s): HGBA1C in the last 72 hours. Fasting Lipid Panel: No results for input(s): CHOL, HDL, LDLCALC, TRIG, CHOLHDL, LDLDIRECT in the last 72 hours. Thyroid  Function Tests: No results for input(s): TSH, T4TOTAL, T3FREE, THYROIDAB in the last 72 hours.  Invalid input(s): FREET3 Anemia Panel: No results for input(s): VITAMINB12, FOLATE, FERRITIN, TIBC, IRON, RETICCTPCT in the last 72 hours.   Radiology: ECHOCARDIOGRAM COMPLETE Result Date: 01/10/2024    ECHOCARDIOGRAM REPORT   Patient Name:   Timothy Wells Date of Exam: 01/09/2024 Medical Rec #:  [id number]       Height:       67.0 in Accession #:    [id number]      Weight:       149.3 lb Date of Birth:  1950-10-01       BSA:          1.786 m Patient Age:    73 years        BP:           101/76 mmHg Patient Gender: M               HR:           115 bpm. Exam Location:  [facility] Procedure: 2D Echo, Color Doppler and Cardiac Doppler (Both Spectral and Color            Flow Doppler were utilized during procedure). Indications:     Ventriculat tachycardia I47.2  History:         Patient has prior history of Echocardiogram examinations,  most                  recent 07/03/2020. Stroke, Arrythmias:Atrial Fibrillation; Risk                  Factors:Diabetes.  Sonographer:     Christopher Furnace Referring Phys:  [id number] DELAYNE LULLA SOLIAN Diagnosing Phys: Marsa Dooms MD IMPRESSIONS  1. Left ventricular ejection fraction, by estimation, is 55 to 60%. The left ventricle has normal function. The left ventricle has no regional wall motion abnormalities. Left ventricular diastolic parameters are indeterminate.  2. Right ventricular systolic function is normal. The right ventricular size is normal.  3. The mitral valve is normal in structure. Mild mitral valve regurgitation. No evidence  of mitral stenosis.  4. The aortic valve is normal in structure. Aortic valve regurgitation is mild. No aortic stenosis is present.  5. The inferior vena cava is normal in size with greater than 50% respiratory variability, suggesting right atrial pressure of 3 mmHg. FINDINGS  Left Ventricle: Left ventricular ejection fraction, by estimation, is 55 to 60%. The left ventricle has normal function. The left ventricle has no regional wall motion abnormalities. Strain was performed and the global longitudinal strain is indeterminate. The left ventricular internal cavity size was normal in size. There is no left ventricular hypertrophy. Left ventricular diastolic parameters are indeterminate. Right Ventricle: The right ventricular size is normal. No increase in right ventricular wall thickness. Right ventricular systolic function is normal. Left Atrium: Left atrial size was normal in size. Right Atrium: Right atrial size was normal in size. Pericardium: There is no evidence of pericardial effusion. Mitral Valve: The mitral valve is normal in structure. Mild mitral valve regurgitation. No evidence of mitral valve stenosis. Tricuspid Valve: The tricuspid valve is normal in structure. Tricuspid valve regurgitation is mild . No evidence of tricuspid stenosis. Aortic Valve: The aortic valve  is normal in structure. Aortic valve regurgitation is mild. No aortic stenosis is present. Aortic valve mean gradient measures 2.0 mmHg. Aortic valve peak gradient measures 3.2 mmHg. Aortic valve area, by VTI measures 2.97 cm. Pulmonic Valve: The pulmonic valve was normal in structure. Pulmonic valve regurgitation is not visualized. No evidence of pulmonic stenosis. Aorta: The aortic root is normal in size and structure. Venous: The inferior vena cava is normal in size with greater than 50% respiratory variability, suggesting right atrial pressure of 3 mmHg. IAS/Shunts: No atrial level shunt detected by color flow Doppler. Additional Comments: 3D was performed not requiring image post processing on an independent workstation and was indeterminate.  LEFT VENTRICLE PLAX 2D LVIDd:         3.18 cm   Diastology LVIDs:         2.18 cm   LV e' medial:    4.35 cm/s LV PW:         1.12 cm   LV E/e' medial:  20.5 LV IVS:        2.03 cm   LV e' lateral:   4.54 cm/s LVOT diam:     2.00 cm   LV E/e' lateral: 19.6 LV SV:         39 LV SV Index:   22 LVOT Area:     3.14 cm LV IVRT:       117 msec  RIGHT VENTRICLE RV Basal diam:  3.75 cm RV Mid diam:    2.66 cm LEFT ATRIUM             Index        RIGHT ATRIUM           Index LA diam:        3.30 cm 1.85 cm/m   RA Area:     11.50 cm LA Vol (A2C):   15.3 ml 8.57 ml/m   RA Volume:   26.80 ml  15.01 ml/m LA Vol (A4C):   25.1 ml 14.06 ml/m LA Biplane Vol: 21.0 ml 11.76 ml/m  AORTIC VALVE AV Area (Vmax):    2.61 cm AV Area (Vmean):   2.58 cm AV Area (VTI):     2.97 cm AV Vmax:           89.00 cm/s AV Vmean:  61.100 cm/s AV VTI:            0.132 m AV Peak Grad:      3.2 mmHg AV Mean Grad:      2.0 mmHg LVOT Vmax:         73.90 cm/s LVOT Vmean:        50.100 cm/s LVOT VTI:          0.125 m LVOT/AV VTI ratio: 0.95  AORTA Ao Root diam: 2.70 cm MITRAL VALVE               TRICUSPID VALVE MV Area (PHT): 5.46 cm    TR Peak grad:   7.2 mmHg MV Decel Time: 139 msec    TR Vmax:         134.00 cm/s MV E velocity: 89.20 cm/s                            SHUNTS                            Systemic VTI:  0.12 m                            Systemic Diam: 2.00 cm Marsa Dooms MD Electronically signed by Marsa Dooms MD Signature Date/Time: 01/10/2024/10:02:44 AM    Final    DG Chest Portable 1 View Result Date: 01/08/2024 EXAM: 1 VIEW(S) XRAY OF THE CHEST 01/08/2024 10:43:00 PM COMPARISON: Comparison study 01/02/2021. CLINICAL HISTORY: chest pain FINDINGS: LUNGS AND PLEURA: The lungs are clear bilaterally. No pleural effusion. No pneumothorax. HEART AND MEDIASTINUM: The cardiac shadow is stable. BONES AND SOFT TISSUES: Left shoulder replacement is noted. No acute osseous abnormality. IMPRESSION: 1. No acute cardiopulmonary pathology. Electronically signed by: Oneil Devonshire MD 01/08/2024 10:51 PM EST RP Workstation: [id number]    ECHO as above  TELEMETRY (personally reviewed): NSR rate 70s  EKG (personally reviewed): Ventricular tachycardia rate 191 bpm  Data reviewed by me 01/11/2024: last 24h vitals tele labs imaging I/O ED provider note, admission H&P, hospitalist progress note  Principal Problem:   Wide-complex tachycardia Active Problems:   History of CVA (cerebrovascular accident)   Benign prostatic hyperplasia without lower urinary tract symptoms   Essential hypertension   Venous insufficiency    ASSESSMENT AND PLAN:  Timothy Wells is a 73 y.o. male  with a past medical history of persistent atrial fibrillation s/p ablations 07/16/2020 and 07/16/2021, history of CVA, hypertension, hyperlipidemia, type 2 diabetes who presented to the ED on 01/08/2024 for emesis, generalized weakness.  Wide-complex tachycardia noted on initial EKG in the ED.  Cardiology was consulted for further evaluation.   # Ventricular tachycardia # Demand ischemia # Persistent atrial fibrillation # Hypertension Patient presented to the ED after having sudden onset of generalized  whole body discomfort and weakness with associated episode of vomiting.  Initial EKG in the ED with wide-complex tachycardia heart rate 191 bpm.  He was normotensive and mentating well so he was started on IV amiodarone .  Troponins mildly elevated and trended 153 > 218. Echo this admission with EF  - Continue IV amiodarone . - Consolidate metoprolol  to 50 mg BID. - Continue IV heparin  for anticoagulation.  Plan to resume Eliquis  after heart catheterization. - Continue atorvastatin  80 mg daily. - Suspect mild troponin elevation most consistent with demand/supply mismatch and not ACS as he  is without chest pain however given his wide-complex tachycardia on admission we will plan to set him up for LHC.  - Discussed the risks and benefits of proceeding with LHC for further evaluation with the patient.  He is agreeable to proceed.  NPO until LHC this afternoon (01/11/24) with Dr. Ammon.  Written consent will be obtained.  Further recommendations following LHC.   - Given that EF is normal, will plan to DC patient home with LifeVest for monitoring/defib if needed. Will have patient see EP outpatient to ultimately determine need for ICD.   This patient's plan of care was discussed and created with Dr. Ammon and he is in agreement.  Signed: Danita Bloch, PA-C  01/11/2024, 10:04 AM [facility] Cardiology

## 2024-01-11 NOTE — Plan of Care (Signed)

## 2024-01-11 NOTE — Plan of Care (Signed)
  Problem: Education: Goal: Knowledge of disease or condition will improve Outcome: Progressing   Problem: Activity: Goal: Ability to tolerate increased activity will improve Outcome: Progressing   Problem: Health Behavior/Discharge Planning: Goal: Ability to safely manage health-related needs after discharge will improve Outcome: Progressing   Problem: Clinical Measurements: Goal: Respiratory complications will improve Outcome: Progressing   Problem: Nutrition: Goal: Adequate nutrition will be maintained Outcome: Progressing

## 2024-01-11 NOTE — Consult Note (Signed)
 PHARMACY - ANTICOAGULATION CONSULT NOTE  Pharmacy Consult for heparin  dosing Indication: atrial fibrillation  No Known Allergies  Patient Measurements: Height: 5' 7 (170.2 cm) Weight: 67.7 kg (149 lb 4 oz) IBW/kg (Calculated) : 66.1 HEPARIN  DW (KG): 67.7  Vital Signs: Temp: 97.9 F (36.6 C) (11/13 2000) Temp Source: Oral (11/13 2000) BP: 109/73 (11/14 0200) Pulse Rate: 106 (11/14 0200)  Labs: Recent Labs    01/08/24 2148 01/09/24 1109 01/10/24 0506 01/10/24 0909 01/10/24 1748 01/11/24 0144  HGB 13.1  --  13.6  --   --  13.7  HCT 37.2*  --  38.2*  --   --  38.3*  PLT 259  --  221  --   --  215  APTT  --   --   --  32 61* 92*  LABPROT  --   --   --  14.8  --   --   INR  --   --   --  1.1  --   --   HEPARINUNFRC  --   --   --  >1.10*  --  0.81*  CREATININE 0.63 0.55* 0.68  --   --  0.70    Estimated Creatinine Clearance: 79.2 mL/min (by C-G formula based on SCr of 0.7 mg/dL).   Medical History: Past Medical History:  Diagnosis Date   A-fib [facility])    Dysrhythmia    wide complex tachycardia   Edema of both lower legs    GERD (gastroesophageal reflux disease)    Memory loss    Restless leg syndrome    Skin cancer    skin cancer face basal cell   Stroke (HCC) 2019   Type 2 diabetes mellitus without complication, without long-term current use of insulin (HCC) 04/10/2019    Medications:  PTA apixaban  5mg  bid - last dose 2143 on 11/12  Assessment: Timothy Wells is a 33 yoM that has presented with ventricular tachycardia conversion to atrial fibrillation with RVR. Past medical history notable for atrial flutter s/p ablation on Eliquis , previously on amiodarone , prior CVA, and venous insufficiency.   Goal of Therapy:  Heparin  level 0.3-0.7 units/ml aPTT 66-102 seconds Monitor platelets by anticoagulation protocol: Yes   Baseline aPTT 32, HL >1.10, INR 1.1. Hgb and plt stable.  Date/Time: aPTT/HL: Comment/Rate: 11/13@1748  61sec/-- Subtherapeutic@950   units/hr 11/14 0144 92sec / 0.81 aPTT therapeutic x 1  Plan:  Continue heparin  infusion rate at 1100 units/hr Recheck aPTT in 8 hours to confirm, and continue to monitor aPTT daily until correlation with HL Continue to monitor H&H and platelets  Timothy Wells, PharmD, [facility] 01/11/2024 2:55 AM

## 2024-01-12 DIAGNOSIS — R000 Tachycardia, unspecified: Secondary | ICD-10-CM | POA: Diagnosis not present

## 2024-01-12 LAB — CBC
HCT: 38.4 % — ABNORMAL LOW (ref 39.0–52.0)
Hemoglobin: 13.8 g/dL (ref 13.0–17.0)
MCH: 31.9 pg (ref 26.0–34.0)
MCHC: 35.9 g/dL (ref 30.0–36.0)
MCV: 88.9 fL (ref 80.0–100.0)
Platelets: 224 K/uL (ref 150–400)
RBC: 4.32 MIL/uL (ref 4.22–5.81)
RDW: 12.9 % (ref 11.5–15.5)
WBC: 7.1 K/uL (ref 4.0–10.5)
nRBC: 0 % (ref 0.0–0.2)

## 2024-01-12 LAB — BASIC METABOLIC PANEL WITH GFR
Anion gap: 10 (ref 5–15)
BUN: 16 mg/dL (ref 8–23)
CO2: 25 mmol/L (ref 22–32)
Calcium: 8.6 mg/dL — ABNORMAL LOW (ref 8.9–10.3)
Chloride: 98 mmol/L (ref 98–111)
Creatinine, Ser: 0.7 mg/dL (ref 0.61–1.24)
GFR, Estimated: >60 mL/min (ref >60)
Glucose, Bld: 100 mg/dL — ABNORMAL HIGH (ref 70–99)
Potassium: 4.2 mmol/L (ref 3.5–5.1)
Sodium: 132 mmol/L — ABNORMAL LOW (ref 135–145)

## 2024-01-12 MED ORDER — AMIODARONE HCL 400 MG PO TABS: ORAL_TABLET | ORAL | 0 refills | Status: AC

## 2024-01-12 MED ORDER — METOPROLOL TARTRATE 50 MG PO TABS: 50.0000 mg | ORAL_TABLET | Freq: Two times a day (BID) | ORAL | 1 refills | Status: AC

## 2024-01-12 MED ORDER — AMIODARONE HCL 200 MG PO TABS
400.0000 mg | ORAL_TABLET | Freq: Two times a day (BID) | ORAL | Status: DC
  Administered 2024-01-12: 400 mg via ORAL
  Filled 2024-01-12: qty 2

## 2024-01-12 MED ORDER — POLYETHYLENE GLYCOL 3350 17 G PO PACK: 17.0000 g | PACK | Freq: Every day | ORAL | Status: DC

## 2024-01-12 MED ORDER — SODIUM CHLORIDE 1 G PO TABS: 1.0000 g | ORAL_TABLET | Freq: Two times a day (BID) | ORAL | 0 refills | Status: AC

## 2024-01-12 NOTE — Discharge Summary (Signed)
 Physician Discharge Summary   Patient: Timothy Wells MRN: [id number] DOB: 20-Jul-1950  Admit date:     01/08/2024  Discharge date: 01/12/24  Discharge Physician: Laree Lock   PCP: Steva Clotilda DEL, NP   Recommendations at discharge:   Follow-up with PCP in 1 week -repeat BMP.  Monitor BP  Follow-up with EP within 1 week Follow-up with cardiology as recommend Has LifeVest  Home PT/OT/RN arranged  Discharge Diagnoses: Principal Problem:   Wide-complex tachycardia Active Problems:   Essential hypertension   History of CVA (cerebrovascular accident)   Benign prostatic hyperplasia without lower urinary tract symptoms   Venous insufficiency   Hospital Course: Timothy Wells is a 73 y.o. male with medical history significant for Atrial flutter s/p ablation on Eliquis , previously on amiodarone , prior CVA, HTN,  venous insufficiency, last seen by cardiology in September 2025 admitted with sustained V. tach currently on amiodarone  with which he converted to A-fib. Seen by cardiology, Hospital course as below   Ventricular tachycardia Persistent Atrial fibrillation with RVR with history of ablation - Patient converted to A-fib with RVR with amiodarone  - Elevated Troponin likely due to demand ischemia, BNP elevated.  LHC non obstructive - Echo EF 55 to 60%.  No RWMA.  Diastolic parameters indeterminate.  Mild MR.  Mild AR. - On metoprolol  50 bid, Amiodarone  400mg  bid x 2 weeks followed by 400mg  daily - Continue Eliquis  - Discharged home with LifeVest, follow-up with EP outpatient   Mild hyponatremia, improving - serum osm hypotonic, urine osm elevated consistent with SIADH - salt tabs 1g bid x 3 days at home. Follow up with PCP for repeat BMP x 1 week   Essential hypertension - On metoprolol .  Not taking Lasix/lisinopril   History of CVA (cerebrovascular accident) - Continue statin and antiplatelet   Venous insufficiency - No acute issues   Benign prostatic  hyperplasia without lower urinary tract symptoms - Continue Myrbetriq, Flomax  --Home PT/OT/RN arranged.  Discussed plan with daughter, Burnard at the bedside.  Patient was able to walk around in the room without assistance, but states felt weaker than baseline   Pain control - [place]  Controlled Substance Reporting System database was reviewed. and patient was instructed, not to drive, operate heavy machinery, perform activities at heights, swimming or participation in water  activities or provide baby-sitting services while on Pain, Sleep and Anxiety Medications; until their outpatient Physician has advised to do so again. Also recommended to not to take more than prescribed Pain, Sleep and Anxiety Medications.  Consultants: Cardiology Procedures performed: LHC  Disposition: Home health Diet recommendation:  Discharge Diet Orders (From admission, onward)     Start     Ordered   01/12/24 0000  Diet - low sodium heart healthy        01/12/24 1426           DISCHARGE MEDICATION: Allergies as of 01/12/2024   No Known Allergies      Medication List     STOP taking these medications    atenolol 25 MG tablet Commonly known as: TENORMIN   diltiazem  360 MG 24 hr capsule Commonly known as: CARDIZEM  CD   doxazosin 1 MG tablet Commonly known as: CARDURA   furosemide 20 MG tablet Commonly known as: LASIX   lisinopril 10 MG tablet Commonly known as: ZESTRIL   oxyCODONE -acetaminophen  5-325 MG tablet Commonly known as: Percocet       TAKE these medications    acetaminophen  500 MG tablet Commonly known as:  TYLENOL  Take 1,000 mg by mouth every 6 (six) hours as needed for mild pain or headache.   amiodarone  400 MG tablet Commonly known as: PACERONE  Take 1 tablet (400 mg total) by mouth 2 (two) times daily for 14 days, THEN 1 tablet (400 mg total) daily. Start taking on: January 12, 2024 What changed:  medication strength See the new instructions.   ascorbic  acid 500 MG tablet Commonly known as: VITAMIN C Take 500 mg by mouth daily.   atorvastatin  80 MG tablet Commonly known as: LIPITOR  Take 80 mg by mouth every evening.   CINNAMON PLUS CHROMIUM PO Take 1 tablet by mouth 2 (two) times daily. 500 mg / 100 mg   COSAMIN DS PO Take 1 tablet by mouth 2 (two) times daily.   Cranberry 500 MG Caps Take 500 mg by mouth daily.   cyanocobalamin  1000 MCG tablet Commonly known as: VITAMIN B12 Take 1,000 mcg by mouth daily.   cyclobenzaprine  10 MG tablet Commonly known as: FLEXERIL  Take 1 tablet (10 mg total) by mouth 3 (three) times daily as needed for muscle spasms.   docusate sodium 100 MG capsule Commonly known as: COLACE Take 100 mg by mouth 2 (two) times daily.   Eliquis  5 MG Tabs tablet Generic drug: apixaban  Take 1 tablet by mouth twice daily   Fish Oil 1000 MG Caps Take 1,000 mg by mouth daily. DHA   magnesium  oxide 400 (241.3 Mg) MG tablet Commonly known as: MAG-OX Take 1 tablet (400 mg total) by mouth daily.   metoprolol  tartrate 50 MG tablet Commonly known as: LOPRESSOR  Take 1 tablet (50 mg total) by mouth 2 (two) times daily.   multivitamin with minerals Tabs tablet Take 1 tablet by mouth daily.   Myrbetriq 25 MG Tb24 tablet Generic drug: mirabegron ER Take 25 mg by mouth daily.   ondansetron  4 MG tablet Commonly known as: Zofran  Take 1 tablet (4 mg total) by mouth every 8 (eight) hours as needed for nausea or vomiting.   OVER THE COUNTER MEDICATION Take 1 Scoop by mouth daily. Super Beets power   rOPINIRole 1 MG tablet Commonly known as: REQUIP Take 1 mg by mouth at bedtime.   sodium chloride  1 g tablet Take 1 tablet (1 g total) by mouth 2 (two) times daily with a meal for 3 days.   tamsulosin 0.4 MG Caps capsule Commonly known as: FLOMAX Take 0.4 mg by mouth daily after supper.   TURMERIC PO Take 1,500 mg by mouth daily.   Vitamin D 50 MCG (2000 UT) Caps Take 4,000 Units by mouth daily.    Vitamin E 450 MG (1000 UT) Caps Take 1,000 Units by mouth daily.   Zinc 50 MG Tabs Take 50 mg by mouth daily.               Durable Medical Equipment  (From admission, onward)           Start     Ordered   01/12/24 1253  For home use only DME Vest life vest  Once       Question Answer Comment  Indication: Cardiac Arrest due to VF or sustained VT   Life Vest Setting: VT Heart Rate Threshold - Default 150 BPM   Length of need: 3 months   Start date: 01/12/2024      01/12/24 1254            Contact information for follow-up providers     Florencio Cara BIRCH, MD.  Go in 1 week(s).   Specialties: Cardiology, Internal Medicine Contact information: [address] [place] [address] [phone]              Contact information for after-discharge care     Home Medical Care     [facility]) .   Service: Home Health Services Contact information: [address] [place]  [address] [phone]                    Discharge Exam: Fredricka Weights   01/09/24 1100 01/09/24 1627  Weight: 67.7 kg 67.7 kg    Constitutional:      General: He is not in acute distress. HENT:     Head: Normocephalic and atraumatic.  Cardiovascular:     Rate and Rhythm: RRR    Heart sounds: Normal heart sounds.  Pulmonary:     Effort: Pulmonary effort is normal.     Breath sounds: Normal breath sounds.  Abdominal:     Palpations: Abdomen is soft.     Tenderness: There is no abdominal tenderness.  Neurological:     Mental Status: Mental status is at baseline   Condition at discharge: good  The results of significant diagnostics from this hospitalization (including imaging, microbiology, ancillary and laboratory) are listed below for reference.   Imaging Studies: CARDIAC CATHETERIZATION Result Date: 01/11/2024   The left ventricular systolic function is normal.   LV end diastolic pressure is normal.    The left ventricular ejection fraction is 55-65% by visual estimate. 1.  Normal coronary anatomy 2.  Normal left ventricular function Recommendations 1.  Medical therapy 2.  Resume Eliquis  for stroke prevention 3.  Discharge home with LifeVest 4.  Follow-up with [facility] EP as outpatient   ECHOCARDIOGRAM COMPLETE Result Date: 01/10/2024    ECHOCARDIOGRAM REPORT   Patient Name:   Timothy Wells Date of Exam: 01/09/2024 Medical Rec #:  [id number]       Height:       67.0 in Accession #:    [id number]      Weight:       149.3 lb Date of Birth:  09/16/1950       BSA:          1.786 m Patient Age:    73 years        BP:           101/76 mmHg Patient Gender: M               HR:           115 bpm. Exam Location:  [facility] Procedure: 2D Echo, Color Doppler and Cardiac Doppler (Both Spectral and Color            Flow Doppler were utilized during procedure). Indications:     Ventriculat tachycardia I47.2  History:         Patient has prior history of Echocardiogram examinations, most                  recent 07/03/2020. Stroke, Arrythmias:Atrial Fibrillation; Risk                  Factors:Diabetes.  Sonographer:     Christopher Furnace Referring Phys:  [id number] DELAYNE LULLA SOLIAN Diagnosing Phys: Marsa Dooms MD IMPRESSIONS  1. Left ventricular ejection fraction, by estimation, is 55 to 60%. The left ventricle has normal function. The left ventricle has no regional wall motion abnormalities.  Left ventricular diastolic parameters are indeterminate.  2. Right ventricular systolic function is normal. The right ventricular size is normal.  3. The mitral valve is normal in structure. Mild mitral valve regurgitation. No evidence of mitral stenosis.  4. The aortic valve is normal in structure. Aortic valve regurgitation is mild. No aortic stenosis is present.  5. The inferior vena cava is normal in size with greater than 50% respiratory variability, suggesting right atrial pressure of 3 mmHg. FINDINGS  Left Ventricle: Left ventricular ejection  fraction, by estimation, is 55 to 60%. The left ventricle has normal function. The left ventricle has no regional wall motion abnormalities. Strain was performed and the global longitudinal strain is indeterminate. The left ventricular internal cavity size was normal in size. There is no left ventricular hypertrophy. Left ventricular diastolic parameters are indeterminate. Right Ventricle: The right ventricular size is normal. No increase in right ventricular wall thickness. Right ventricular systolic function is normal. Left Atrium: Left atrial size was normal in size. Right Atrium: Right atrial size was normal in size. Pericardium: There is no evidence of pericardial effusion. Mitral Valve: The mitral valve is normal in structure. Mild mitral valve regurgitation. No evidence of mitral valve stenosis. Tricuspid Valve: The tricuspid valve is normal in structure. Tricuspid valve regurgitation is mild . No evidence of tricuspid stenosis. Aortic Valve: The aortic valve is normal in structure. Aortic valve regurgitation is mild. No aortic stenosis is present. Aortic valve mean gradient measures 2.0 mmHg. Aortic valve peak gradient measures 3.2 mmHg. Aortic valve area, by VTI measures 2.97 cm. Pulmonic Valve: The pulmonic valve was normal in structure. Pulmonic valve regurgitation is not visualized. No evidence of pulmonic stenosis. Aorta: The aortic root is normal in size and structure. Venous: The inferior vena cava is normal in size with greater than 50% respiratory variability, suggesting right atrial pressure of 3 mmHg. IAS/Shunts: No atrial level shunt detected by color flow Doppler. Additional Comments: 3D was performed not requiring image post processing on an independent workstation and was indeterminate.  LEFT VENTRICLE PLAX 2D LVIDd:         3.18 cm   Diastology LVIDs:         2.18 cm   LV e' medial:    4.35 cm/s LV PW:         1.12 cm   LV E/e' medial:  20.5 LV IVS:        2.03 cm   LV e' lateral:   4.54  cm/s LVOT diam:     2.00 cm   LV E/e' lateral: 19.6 LV SV:         39 LV SV Index:   22 LVOT Area:     3.14 cm LV IVRT:       117 msec  RIGHT VENTRICLE RV Basal diam:  3.75 cm RV Mid diam:    2.66 cm LEFT ATRIUM             Index        RIGHT ATRIUM           Index LA diam:        3.30 cm 1.85 cm/m   RA Area:     11.50 cm LA Vol (A2C):   15.3 ml 8.57 ml/m   RA Volume:   26.80 ml  15.01 ml/m LA Vol (A4C):   25.1 ml 14.06 ml/m LA Biplane Vol: 21.0 ml 11.76 ml/m  AORTIC VALVE AV Area (Vmax):    2.61 cm AV  Area (Vmean):   2.58 cm AV Area (VTI):     2.97 cm AV Vmax:           89.00 cm/s AV Vmean:          61.100 cm/s AV VTI:            0.132 m AV Peak Grad:      3.2 mmHg AV Mean Grad:      2.0 mmHg LVOT Vmax:         73.90 cm/s LVOT Vmean:        50.100 cm/s LVOT VTI:          0.125 m LVOT/AV VTI ratio: 0.95  AORTA Ao Root diam: 2.70 cm MITRAL VALVE               TRICUSPID VALVE MV Area (PHT): 5.46 cm    TR Peak grad:   7.2 mmHg MV Decel Time: 139 msec    TR Vmax:        134.00 cm/s MV E velocity: 89.20 cm/s                            SHUNTS                            Systemic VTI:  0.12 m                            Systemic Diam: 2.00 cm Marsa Dooms MD Electronically signed by Marsa Dooms MD Signature Date/Time: 01/10/2024/10:02:44 AM    Final    DG Chest Portable 1 View Result Date: 01/08/2024 EXAM: 1 VIEW(S) XRAY OF THE CHEST 01/08/2024 10:43:00 PM COMPARISON: Comparison study 01/02/2021. CLINICAL HISTORY: chest pain FINDINGS: LUNGS AND PLEURA: The lungs are clear bilaterally. No pleural effusion. No pneumothorax. HEART AND MEDIASTINUM: The cardiac shadow is stable. BONES AND SOFT TISSUES: Left shoulder replacement is noted. No acute osseous abnormality. IMPRESSION: 1. No acute cardiopulmonary pathology. Electronically signed by: Oneil Devonshire MD 01/08/2024 10:51 PM EST RP Workstation: [id number]    Microbiology: Results for orders placed or performed during the hospital encounter of  01/08/24  MRSA Next Gen by PCR, Nasal     Status: Abnormal   Collection Time: 01/09/24 11:20 AM   Specimen: Nasal Mucosa; Nasal Swab  Result Value Ref Range Status   MRSA by PCR Next Gen DETECTED (A) NOT DETECTED Final    Comment: RESULT CALLED TO, READ BACK BY AND VERIFIED WITH: RANKIN ARRANT RN @1353  01/09/2024 [place] (NOTE) The GeneXpert MRSA Assay (FDA approved for NASAL specimens only), is one component of a comprehensive MRSA colonization surveillance program. It is not intended to diagnose MRSA infection nor to guide or monitor treatment for MRSA infections. Test performance is not FDA approved in patients less than 59 years old. Performed at [facility], [address] [place], [place] [address]     Labs: CBC: Recent Labs  Lab 01/08/24 2148 01/10/24 0506 01/11/24 0144 01/12/24 0231  WBC 10.9* 8.5 8.7 7.1  HGB 13.1 13.6 13.7 13.8  HCT 37.2* 38.2* 38.3* 38.4*  MCV 91.0 90.1 89.3 88.9  PLT 259 221 215 224   Basic Metabolic Panel: Recent Labs  Lab 01/08/24 2148 01/09/24 0011 01/09/24 1109 01/10/24 0506 01/11/24 0144 01/12/24 0231  NA 129*  --  130* 131* 130* 132*  K 3.8  --  4.0 4.1 3.9 4.2  CL 91*  --  98 97* 97* 98  CO2 26  --  24 27 24 25   GLUCOSE 147*  --  102* 91 95 100*  BUN 18  --  8 11 12 16   CREATININE 0.63  --  0.55* 0.68 0.70 0.70  CALCIUM  8.6*  --  8.4* 8.7* 8.3* 8.6*  MG  --  1.8  --  2.1  --   --    Liver Function Tests: Recent Labs  Lab 01/08/24 2148  AST 32  ALT 25  ALKPHOS 74  BILITOT 0.4  PROT 6.9  ALBUMIN 4.4   CBG: Recent Labs  Lab 01/09/24 1040  GLUCAP 101*    Discharge time spent: greater than 30 minutes.  Signed: Laree Lock, MD [facility] 01/12/2024

## 2024-01-12 NOTE — Evaluation (Signed)
 Occupational Therapy Evaluation Patient Details Name: Timothy Wells MRN: [id number] DOB: 12/16/1950 Today's Date: 01/12/2024   History of Present Illness   Pt is a 73 y/o M admitted on 01/08/24 after presenting with c/o feeling bad & vomiting. Pt being treated for ventricular tachycardia & persistent a-fib with RVR with hx of ablation. Pt underwent LHC on 01/09/24. PMH: a-flutter s/p ablation on Eliquis , CVA, HTN, venous insufficiency, HLD, DM2, memory loss, GERD, skin CA, R TSA     Clinical Impressions Upon entering the room, pt supine in bed and agreeable to OT evaluation. Pt's daughter present during session as well and reports being able to offer intermittent assist at discharge. Pt reports being Ind at baseline and living at home alone. Pt stands and ambulates with min guard 300' but when given RW he is able to mobilize with supervision overall with the additional support. RW recommended for safety at home. Pt seated on EOB with lunch tray and able open all containers and feed self without issue. All vitals remain WFLs during functional mobility tasks and pt with no adverse symptoms. Patient will benefit from acute OT to increase overall independence in the areas of ADLs, functional mobility, and safety awareness in order to safely discharge.     If plan is discharge home, recommend the following:   A little help with walking and/or transfers;A little help with bathing/dressing/bathroom;Assistance with cooking/housework;Assist for transportation;Help with stairs or ramp for entrance     Functional Status Assessment   Patient has had a recent decline in their functional status and demonstrates the ability to make significant improvements in function in a reasonable and predictable amount of time.     Equipment Recommendations   None recommended by OT      Precautions/Restrictions   Precautions Precautions: Fall Precaution/Restrictions Comments: LifeVest     Mobility  Bed Mobility Overal bed mobility: Modified Independent Bed Mobility: Supine to Sit     Supine to sit: Modified independent (Device/Increase time), HOB elevated, Used rails     General bed mobility comments: exit R side of bed    Transfers Overall transfer level: Needs assistance Equipment used: None Transfers: Sit to/from Stand Sit to Stand: Contact guard assist           General transfer comment: sit>stand from EOB, cuing re: hand placement when transferring sit<>stand with RW      Balance Overall balance assessment: Needs assistance Sitting-balance support: Feet supported Sitting balance-Leahy Scale: Good     Standing balance support: During functional activity, No upper extremity supported Standing balance-Leahy Scale: Fair                             ADL either performed or assessed with clinical judgement   ADL Overall ADL's : Needs assistance/impaired                         Toilet Transfer: Contact guard assist Toilet Transfer Details (indicate cue type and reason): simulated                 Vision Patient Visual Report: No change from baseline              Pertinent Vitals/Pain Pain Assessment Pain Assessment: No/denies pain     Extremity/Trunk Assessment Upper Extremity Assessment Upper Extremity Assessment: Overall WFL for tasks assessed (edu on not pushing through R UE)   Lower Extremity Assessment Lower Extremity Assessment:  Overall [facility] for tasks assessed   Cervical / Trunk Assessment Cervical / Trunk Assessment: Normal   Communication Communication Communication: No apparent difficulties   Cognition Arousal: Alert Behavior During Therapy: WFL for tasks assessed/performed Cognition: No apparent impairments                               Following commands: Intact       Cueing  General Comments   Cueing Techniques: Verbal cues  VSS           Home Living Family/patient expects to  be discharged to:: Private residence Living Arrangements: Alone Available Help at Discharge: Family;Available PRN/intermittently Type of Home: House Home Access: Stairs to enter [employer] of Steps: 2 Entrance Stairs-Rails:  (can hold to wall) Home Layout: One level     Bathroom Shower/Tub: Tub/shower unit         Home Equipment: [employer] (2 wheels)          Prior Functioning/Environment               Mobility Comments: Ambulatory without AD, driving. ADLs Comments: Ind with ADLs and IADLs    OT Problem List: Decreased strength;Decreased safety awareness;Decreased activity tolerance;Impaired balance (sitting and/or standing);Decreased knowledge of precautions   OT Treatment/Interventions: Self-care/ADL training;Therapeutic activities;Therapeutic exercise;Energy conservation;Patient/family education;Balance training;DME and/or AE instruction      OT Goals(Current goals can be found in the care plan section)   Acute Rehab OT Goals Patient Stated Goal: to go home OT Goal Formulation: With patient/family Time For Goal Achievement: 01/26/24 Potential to Achieve Goals: Fair ADL Goals Pt Will Perform Grooming: Independently;standing Pt Will Perform Lower Body Dressing: with modified independence;sit to/from stand Pt Will Transfer to Toilet: with modified independence;ambulating Pt Will Perform Toileting - Clothing Manipulation and hygiene: with modified independence;sit to/from stand   OT Frequency:  Min 2X/week       AM-PAC OT 6 Clicks Daily Activity     Outcome Measure Help from another person eating meals?: None Help from another person taking care of personal grooming?: None Help from another person toileting, which includes using toliet, bedpan, or urinal?: A Little Help from another person bathing (including washing, rinsing, drying)?: A Little Help from another person to put on and taking off regular upper body clothing?: None Help  from another person to put on and taking off regular lower body clothing?: A Little 6 Click Score: 21   End of Session Nurse Communication: Mobility status  Activity Tolerance: Patient tolerated treatment well Patient left: in bed;with call bell/phone within reach;with family/visitor present  OT Visit Diagnosis: Unsteadiness on feet (R26.81);Muscle weakness (generalized) (M62.81)                Time: 8647-8590 OT Time Calculation (min): 17 min Charges:  OT General Charges $OT Visit: 1 Visit OT Evaluation $OT Eval Low Complexity: 1 Low  Izetta Claude, MS, OTR/L , CBIS ascom [phone]  01/12/24, 2:46 PM

## 2024-01-12 NOTE — Plan of Care (Signed)
  Problem: Education: Goal: Knowledge of disease or condition will improve Outcome: Progressing Goal: Understanding of medication regimen will improve Outcome: Progressing   Problem: Activity: Goal: Ability to tolerate increased activity will improve Outcome: Progressing   Problem: Cardiac: Goal: Ability to achieve and maintain adequate cardiopulmonary perfusion will improve Outcome: Progressing   Problem: Health Behavior/Discharge Planning: Goal: Ability to safely manage health-related needs after discharge will improve Outcome: Progressing   Problem: Clinical Measurements: Goal: Ability to maintain clinical measurements within normal limits will improve Outcome: Progressing Goal: Will remain free from infection Outcome: Progressing Goal: Diagnostic test results will improve Outcome: Progressing Goal: Respiratory complications will improve Outcome: Progressing Goal: Cardiovascular complication will be avoided Outcome: Progressing   Problem: Activity: Goal: Risk for activity intolerance will decrease Outcome: Progressing   Problem: Nutrition: Goal: Adequate nutrition will be maintained Outcome: Progressing   Problem: Coping: Goal: Level of anxiety will decrease Outcome: Progressing   Problem: Elimination: Goal: Will not experience complications related to bowel motility Outcome: Progressing Goal: Will not experience complications related to urinary retention Outcome: Progressing   Problem: Pain Managment: Goal: General experience of comfort will improve and/or be controlled Outcome: Progressing   Problem: Activity: Goal: Ability to return to baseline activity level will improve Outcome: Progressing   Problem: Safety: Goal: Ability to remain free from injury will improve Outcome: Progressing   Problem: Skin Integrity: Goal: Risk for impaired skin integrity will decrease Outcome: Progressing   Problem: Cardiovascular: Goal: Ability to achieve and  maintain adequate cardiovascular perfusion will improve Outcome: Progressing Goal: Vascular access site(s) Level 0-1 will be maintained Outcome: Progressing   Problem: Activity: Goal: Ability to return to baseline activity level will improve Outcome: Progressing

## 2024-01-12 NOTE — Progress Notes (Signed)
 Patient ID: Timothy Wells, male   DOB: [birth year], 73 y.o.   MRN: [id number] [name]    SUBJECTIVE: Patient feeling much improved no significant palpitation tachycardia no lightheaded no dizziness no nausea vomiting   Vitals:   01/12/24 0600 01/12/24 0700 01/12/24 0800 01/12/24 0808  BP: 114/74 104/74 98/73   Pulse: 74 73 74   Resp: 14 18 (!) 26   Temp:    98.1 F (36.7 C)  TempSrc:    Oral  SpO2: 97% 97% 97%   Weight:      Height:         Intake/Output Summary (Last 24 hours) at 01/12/2024 0913 Last data filed at 01/12/2024 0800 Gross per 24 hour  Intake 480.89 ml  Output 1550 ml  Net -1069.11 ml      PHYSICAL EXAM  General: Well developed, well nourished, in no acute distress HEENT:  Normocephalic and atramatic Neck:  No JVD.  Lungs: Clear bilaterally to auscultation and percussion. Heart: HRRR . Normal S1 and S2 without gallops or murmurs.  Abdomen: Bowel sounds are positive, abdomen soft and non-tender  Msk:  Back normal, normal gait. Normal strength and tone for age. Extremities: No clubbing, cyanosis or edema.   Neuro: Alert and oriented X 3. Psych:  Good affect, responds appropriately   LABS: Basic Metabolic Panel: Recent Labs    01/10/24 0506 01/11/24 0144 01/12/24 0231  NA 131* 130* 132*  K 4.1 3.9 4.2  CL 97* 97* 98  CO2 27 24 25   GLUCOSE 91 95 100*  BUN 11 12 16   CREATININE 0.68 0.70 0.70  CALCIUM  8.7* 8.3* 8.6*  MG 2.1  --   --    Liver Function Tests: No results for input(s): AST, ALT, ALKPHOS, BILITOT, PROT, ALBUMIN in the last 72 hours. No results for input(s): LIPASE, AMYLASE in the last 72 hours. CBC: Recent Labs    01/11/24 0144 01/12/24 0231  WBC 8.7 7.1  HGB 13.7 13.8  HCT 38.3* 38.4*  MCV 89.3 88.9  PLT 215 224   Cardiac Enzymes: No results for input(s): CKTOTAL, CKMB, CKMBINDEX, TROPONINI in the last 72 hours. BNP: Invalid input(s): POCBNP D-Dimer: No results for input(s): DDIMER  in the last 72 hours. Hemoglobin A1C: No results for input(s): HGBA1C in the last 72 hours. Fasting Lipid Panel: No results for input(s): CHOL, HDL, LDLCALC, TRIG, CHOLHDL, LDLDIRECT in the last 72 hours. Thyroid  Function Tests: No results for input(s): TSH, T4TOTAL, T3FREE, THYROIDAB in the last 72 hours.  Invalid input(s): FREET3 Anemia Panel: No results for input(s): VITAMINB12, FOLATE, FERRITIN, TIBC, IRON, RETICCTPCT in the last 72 hours.  CARDIAC CATHETERIZATION Result Date: 01/11/2024   The left ventricular systolic function is normal.   LV end diastolic pressure is normal.   The left ventricular ejection fraction is 55-65% by visual estimate. 1.  Normal coronary anatomy 2.  Normal left ventricular function Recommendations 1.  Medical therapy 2.  Resume Eliquis  for stroke prevention 3.  Discharge home with LifeVest 4.  Follow-up with [facility] EP as outpatient     Echo preserved left ventricular function EF of 55 to 60%  TELEMETRY: Tachycardia possibly related to flutter rate of 100 right bundle branch block sinus tach cannot be completely excluded:  ASSESSMENT AND PLAN:  Principal Problem:   Wide-complex tachycardia Active Problems:   History of CVA (cerebrovascular accident)   Benign prostatic hyperplasia without lower urinary tract symptoms   Essential hypertension   Venous insufficiency History of atrial fibrillation status  post ablation Hyperlipidemia Diabetes  Plan Continue amiodarone  load transition to p.o. 400 twice a day Recommend LifeVest for what I considered to be sustained ventricular tachycardia Refer patient to EP for possible EP study and possible ablation Continue anticoagulation Agree with diabetes management support Blood pressure control including beta-blockade Status post cardiac cath with no significant obstructive coronary disease Continue with statin therapy for hyperlipidemia Recommend LifeVest and referral to  EP as an outpatient  Timothy JONETTA Lovelace, MD 01/12/2024 9:13 AM

## 2024-01-12 NOTE — Progress Notes (Signed)
 LifeVest placed on patient by representative and education was provided.

## 2024-01-12 NOTE — TOC Transition Note (Signed)
 Transition of Care [facility]) - Discharge Note   Patient Details  Name: Timothy Wells MRN: [id number] Date of Birth: 1950/07/29  [facility] CM/SW Contact:  Laurice Kimmons L Caley Ciaramitaro, LCSW Phone Number: 01/12/2024, 1:44 PM   Clinical Narrative:     Discharge orders are in. Patient received Life Vest from [facility]. HHA orders in. [facility] accepted referral for patient.   No further TOC needs. TOC signing off.  Final next level of care: Home w Home Health Services Barriers to Discharge: Barriers Resolved   Patient Goals and CMS Choice   CMS Medicare.gov Compare Post Acute Care list provided to:: Patient Choice offered to / list presented to : Patient [place] ownership interest in [facility].provided to:: Patient    Discharge Placement                    Patient and family notified of of transfer: 01/12/24  Discharge Plan and Services Additional resources added to the After Visit Summary for                    DME Agency: [facility] Date DME Agency Contacted: 01/11/24   Representative spoke with at DME Agency: [facility] HH Arranged: PT, OT, RN [facility] Agency: [facility] Date [facility] Agency Contacted: 01/12/24 Time HH Agency Contacted: [phone] Representative spoke with at [facility] Agency: [facility]  Social Drivers of Health (SDOH) Interventions SDOH Screenings   Food Insecurity: No Food Insecurity (01/09/2024)  Housing: Low Risk  (01/09/2024)  Transportation Needs: No Transportation Needs (01/09/2024)  Utilities: Not At Risk (01/09/2024)  Financial Resource Strain: Low Risk  (11/02/2023)   Received from [facility]  Physical Activity: Sufficiently Active (11/02/2023)   Received from [facility]  Social Connections: Moderately Isolated (01/09/2024)  Stress: No Stress Concern Present (11/02/2023)   Received from [facility]  Tobacco Use: Low Risk  (11/15/2023)   Received from [facility]   Health Literacy: Adequate Health Literacy (11/02/2023)   Received from [facility]     Readmission Risk Interventions     No data to display

## 2024-01-12 NOTE — Evaluation (Signed)
 Physical Therapy Evaluation Patient Details Name: Timothy Wells MRN: [id number] DOB: [birth year] Today's Date: 01/12/2024  History of Present Illness  Pt is a 73 y/o M admitted on 01/08/24 after presenting with c/o feeling bad & vomiting. Pt being treated for ventricular tachycardia & persistent a-fib with RVR with hx of ablation. Pt underwent LHC on 01/09/24. PMH: a-flutter s/p ablation on Eliquis , CVA, HTN, venous insufficiency, HLD, DM2, memory loss, GERD, skin CA, R TSA  Clinical Impression  Pt seen for PT evaluation with pt agreeable, daughter present in room. Pt reports prior to admission he was independent without AD, living alone, driving. On this date, pt ambulates without AD with CGA<>min assist, decreased balance & impaired gait pattern as noted below. Provided pt with RW & pt ambulated with improved balance. Recommend use of RW upon d/c & HHPT f/u services.      If plan is discharge home, recommend the following: Assistance with cooking/housework;Assist for transportation;Help with stairs or ramp for entrance   Can travel by private vehicle        Equipment Recommendations None recommended by PT (pt reports he already has a RW)  Recommendations for Other Services       Functional Status Assessment Patient has had a recent decline in their functional status and demonstrates the ability to make significant improvements in function in a reasonable and predictable amount of time.     Precautions / Restrictions Precautions Precautions: Fall Precaution/Restrictions Comments: LifeVest Restrictions Weight Bearing Restrictions Per Provider Order: No      Mobility  Bed Mobility Overal bed mobility: Modified Independent Bed Mobility: Supine to Sit     Supine to sit: Modified independent (Device/Increase time), HOB elevated, Used rails     General bed mobility comments: exit R side of bed    Transfers Overall transfer level: Needs assistance Equipment used:  None Transfers: Sit to/from Stand Sit to Stand: Contact guard assist           General transfer comment: sit>stand from EOB, cuing re: hand placement when transferring sit<>stand with RW    Ambulation/Gait Ambulation/Gait assistance: Min assist, Contact guard assist, Supervision Gait Distance (Feet): 200 Feet (+ 200 ft) Assistive device: None, Rolling walker (2 wheels) Gait Pattern/deviations: Decreased step length - right, Decreased step length - left, Decreased stride length       General Gait Details: decreased heel strike BLE. Pt ambulates around ICU without AD with CGA<>min assist 2/2 decreased balance. Provided pt with RW & pt ambulated with supervision. Pt with increased BLE hip flexion during swing phase, decreased BLE dorsiflexion. Reports decreased balance 2/2 not being OOB in days.  Stairs            Wheelchair Mobility     Tilt Bed    Modified Rankin (Stroke Patients Only)       Balance Overall balance assessment: Needs assistance Sitting-balance support: Feet supported Sitting balance-Leahy Scale: Good     Standing balance support: During functional activity, No upper extremity supported Standing balance-Leahy Scale: Fair                               Pertinent Vitals/Pain Pain Assessment Pain Assessment: No/denies pain    Home Living Family/patient expects to be discharged to:: Private residence Living Arrangements: Alone Available Help at Discharge: Family;Available PRN/intermittently Type of Home: House Home Access: Stairs to enter Entrance Stairs-Rails:  (can hold to wall) Entrance Stairs-Number of Steps: 2  Home Layout: One level Home Equipment: Agricultural Consultant (2 wheels)      Prior Function               Mobility Comments: Ambulatory without AD, driving.       Extremity/Trunk Assessment   Upper Extremity Assessment Upper Extremity Assessment: Overall WFL for tasks assessed    Lower Extremity  Assessment Lower Extremity Assessment: Overall WFL for tasks assessed (education to not push/pull through RUE s/p LHC)       Communication   Communication Communication: No apparent difficulties    Cognition Arousal: Alert Behavior During Therapy: WFL for tasks assessed/performed   PT - Cognitive impairments: Safety/Judgement                         Following commands: Intact       Cueing Cueing Techniques: Verbal cues     General Comments General comments (skin integrity, edema, etc.): VSS    Exercises     Assessment/Plan    PT Assessment Patient needs continued PT services  PT Problem List Decreased strength;Decreased activity tolerance;Decreased balance;Decreased mobility;Decreased knowledge of use of DME;Decreased safety awareness;Decreased knowledge of precautions       PT Treatment Interventions DME instruction;Balance training;Neuromuscular re-education;Stair training;Gait training;Functional mobility training;Patient/family education;Therapeutic activities;Manual techniques;Therapeutic exercise    PT Goals (Current goals can be found in the Care Plan section)  Acute Rehab PT Goals Patient Stated Goal: go home PT Goal Formulation: With patient Time For Goal Achievement: 01/26/24 Potential to Achieve Goals: Good    Frequency Min 2X/week     Co-evaluation               AM-PAC PT 6 Clicks Mobility  Outcome Measure Help needed turning from your back to your side while in a flat bed without using bedrails?: None Help needed moving from lying on your back to sitting on the side of a flat bed without using bedrails?: None Help needed moving to and from a bed to a chair (including a wheelchair)?: A Little Help needed standing up from a chair using your arms (e.g., wheelchair or bedside chair)?: A Little Help needed to walk in hospital room?: A Little Help needed climbing 3-5 steps with a railing? : A Little 6 Click Score: 20    End of  Session   Activity Tolerance: Patient tolerated treatment well Patient left: in bed;with call bell/phone within reach;with family/visitor present Nurse Communication: Mobility status PT Visit Diagnosis: Unsteadiness on feet (R26.81);Other abnormalities of gait and mobility (R26.89);Muscle weakness (generalized) (M62.81)    Time: 8647-8591 PT Time Calculation (min) (ACUTE ONLY): 16 min   Charges:   PT Evaluation $PT Eval Low Complexity: 1 Low   PT General Charges $$ ACUTE PT VISIT: 1 Visit         Richerd Pinal, PT, DPT 01/12/24, 2:23 PM   Richerd CHRISTELLA Pinal 01/12/2024, 2:19 PM

## 2024-01-12 NOTE — Discharge Instructions (Addendum)
 Please follow-up with your PCP after hospital discharge regarding Home Health needs.  Call Dr. Bernita office early Monday morning regarding an appointment on Monday or Tuesday, according to instructions from Dr. Florencio.   Dr. Florencio will make a referral to [facility] per his discussion with you.  There will be further instructions regarding Amiodarone . The dose will change in the future. This is very important!!

## 2024-01-14 ENCOUNTER — Encounter: Payer: Self-pay | Admitting: Cardiology

## 2024-01-15 DIAGNOSIS — I484 Atypical atrial flutter: Secondary | ICD-10-CM | POA: Diagnosis not present

## 2024-01-15 DIAGNOSIS — I4819 Other persistent atrial fibrillation: Secondary | ICD-10-CM | POA: Diagnosis not present

## 2024-01-15 DIAGNOSIS — R000 Tachycardia, unspecified: Secondary | ICD-10-CM | POA: Diagnosis not present

## 2024-01-17 ENCOUNTER — Other Ambulatory Visit: Payer: Self-pay | Admitting: Cardiology

## 2024-01-17 DIAGNOSIS — Z8673 Personal history of transient ischemic attack (TIA), and cerebral infarction without residual deficits: Secondary | ICD-10-CM | POA: Diagnosis not present

## 2024-01-17 DIAGNOSIS — I872 Venous insufficiency (chronic) (peripheral): Secondary | ICD-10-CM | POA: Diagnosis not present

## 2024-01-17 DIAGNOSIS — I472 Ventricular tachycardia, unspecified: Secondary | ICD-10-CM | POA: Diagnosis not present

## 2024-01-17 DIAGNOSIS — I4892 Unspecified atrial flutter: Secondary | ICD-10-CM | POA: Diagnosis not present

## 2024-01-17 DIAGNOSIS — I10 Essential (primary) hypertension: Secondary | ICD-10-CM | POA: Diagnosis not present

## 2024-01-17 DIAGNOSIS — N400 Enlarged prostate without lower urinary tract symptoms: Secondary | ICD-10-CM | POA: Diagnosis not present

## 2024-01-17 DIAGNOSIS — I4819 Other persistent atrial fibrillation: Secondary | ICD-10-CM | POA: Diagnosis not present

## 2024-01-17 DIAGNOSIS — R000 Tachycardia, unspecified: Secondary | ICD-10-CM

## 2024-01-17 DIAGNOSIS — Z7901 Long term (current) use of anticoagulants: Secondary | ICD-10-CM | POA: Diagnosis not present

## 2024-01-17 DIAGNOSIS — I083 Combined rheumatic disorders of mitral, aortic and tricuspid valves: Secondary | ICD-10-CM | POA: Diagnosis not present

## 2024-01-17 DIAGNOSIS — E871 Hypo-osmolality and hyponatremia: Secondary | ICD-10-CM | POA: Diagnosis not present

## 2024-01-17 DIAGNOSIS — Z9181 History of falling: Secondary | ICD-10-CM | POA: Diagnosis not present

## 2024-01-18 ENCOUNTER — Encounter (HOSPITAL_COMMUNITY): Payer: Self-pay

## 2024-01-21 ENCOUNTER — Other Ambulatory Visit: Payer: Self-pay | Admitting: Cardiology

## 2024-01-21 ENCOUNTER — Ambulatory Visit (HOSPITAL_COMMUNITY)
Admission: RE | Admit: 2024-01-21 | Discharge: 2024-01-21 | Disposition: A | Discharge status: Home / Self Care | Source: Ambulatory Visit | Attending: Cardiology | Admitting: Cardiology

## 2024-01-21 DIAGNOSIS — R000 Tachycardia, unspecified: Secondary | ICD-10-CM

## 2024-01-21 MED ORDER — GADOBUTROL 1 MMOL/ML IV SOLN
8.0000 mL | Freq: Once | INTRAVENOUS | Status: AC | PRN
  Administered 2024-01-21: 8 mL via INTRAVENOUS

## 2024-01-28 DIAGNOSIS — E119 Type 2 diabetes mellitus without complications: Secondary | ICD-10-CM | POA: Diagnosis not present

## 2024-01-28 DIAGNOSIS — I4819 Other persistent atrial fibrillation: Secondary | ICD-10-CM | POA: Diagnosis not present

## 2024-01-28 DIAGNOSIS — R011 Cardiac murmur, unspecified: Secondary | ICD-10-CM | POA: Diagnosis not present

## 2024-01-28 DIAGNOSIS — R000 Tachycardia, unspecified: Secondary | ICD-10-CM | POA: Diagnosis not present

## 2024-01-28 DIAGNOSIS — K219 Gastro-esophageal reflux disease without esophagitis: Secondary | ICD-10-CM | POA: Diagnosis not present

## 2024-01-28 DIAGNOSIS — Z8679 Personal history of other diseases of the circulatory system: Secondary | ICD-10-CM | POA: Diagnosis not present

## 2024-01-28 DIAGNOSIS — Z9889 Other specified postprocedural states: Secondary | ICD-10-CM | POA: Diagnosis not present

## 2024-01-28 DIAGNOSIS — R600 Localized edema: Secondary | ICD-10-CM | POA: Diagnosis not present

## 2024-01-28 DIAGNOSIS — E7849 Other hyperlipidemia: Secondary | ICD-10-CM | POA: Diagnosis not present

## 2024-01-28 DIAGNOSIS — I10 Essential (primary) hypertension: Secondary | ICD-10-CM | POA: Diagnosis not present

## 2024-01-28 DIAGNOSIS — I484 Atypical atrial flutter: Secondary | ICD-10-CM | POA: Diagnosis not present

## 2024-01-28 DIAGNOSIS — Z8673 Personal history of transient ischemic attack (TIA), and cerebral infarction without residual deficits: Secondary | ICD-10-CM | POA: Diagnosis not present

## 2024-01-29 DIAGNOSIS — I428 Other cardiomyopathies: Secondary | ICD-10-CM | POA: Diagnosis not present
